# Patient Record
Sex: Male | Born: 1965
Health system: Southern US, Academic
[De-identification: ages and names within clinical notes are randomized; demographics above are authoritative.]

## PROBLEM LIST (undated history)

## (undated) ENCOUNTER — Ambulatory Visit

## (undated) DIAGNOSIS — IMO0002 Reserved for concepts with insufficient information to code with codable children: Secondary | ICD-10-CM

## (undated) DIAGNOSIS — F32A Depression, unspecified: Secondary | ICD-10-CM

## (undated) DIAGNOSIS — M1990 Unspecified osteoarthritis, unspecified site: Secondary | ICD-10-CM

## (undated) DIAGNOSIS — F329 Major depressive disorder, single episode, unspecified: Secondary | ICD-10-CM

## (undated) DIAGNOSIS — N200 Calculus of kidney: Secondary | ICD-10-CM

## (undated) HISTORY — DX: Reserved for concepts with insufficient information to code with codable children: IMO0002

## (undated) HISTORY — DX: Calculus of kidney: N20.0

## (undated) HISTORY — PX: MICRODISCECTOMY LUMBAR: SUR864

## (undated) HISTORY — DX: Unspecified osteoarthritis, unspecified site: M19.90

## (undated) HISTORY — DX: Depression, unspecified: F32.A

## (undated) HISTORY — DX: Major depressive disorder, single episode, unspecified: F32.9

---
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
## 1998-01-15 ENCOUNTER — Emergency Department (HOSPITAL_COMMUNITY): Admission: EM | Admit: 1998-01-15 | Discharge: 1998-01-15 | Payer: Self-pay

## 1998-01-19 ENCOUNTER — Emergency Department (HOSPITAL_COMMUNITY): Admission: EM | Admit: 1998-01-19 | Discharge: 1998-01-19 | Payer: Self-pay | Admitting: Emergency Medicine

## 2000-12-06 ENCOUNTER — Encounter: Admission: RE | Admit: 2000-12-06 | Discharge: 2000-12-06 | Payer: Self-pay | Admitting: Family Medicine

## 2000-12-06 ENCOUNTER — Encounter: Payer: Self-pay | Admitting: Family Medicine

## 2009-07-09 ENCOUNTER — Emergency Department (HOSPITAL_COMMUNITY): Admission: EM | Admit: 2009-07-09 | Discharge: 2009-07-09 | Payer: Self-pay | Admitting: Emergency Medicine

## 2010-01-27 ENCOUNTER — Ambulatory Visit (HOSPITAL_BASED_OUTPATIENT_CLINIC_OR_DEPARTMENT_OTHER): Admission: RE | Admit: 2010-01-27 | Discharge: 2010-01-27 | Payer: Self-pay | Admitting: Otolaryngology

## 2010-01-31 ENCOUNTER — Ambulatory Visit: Payer: Self-pay | Admitting: Pulmonary Disease

## 2010-03-31 ENCOUNTER — Ambulatory Visit (HOSPITAL_COMMUNITY): Admission: RE | Admit: 2010-03-31 | Payer: Self-pay | Source: Home / Self Care | Admitting: Orthopedic Surgery

## 2010-06-19 ENCOUNTER — Other Ambulatory Visit: Payer: Self-pay | Admitting: Orthopedic Surgery

## 2010-06-19 ENCOUNTER — Encounter (HOSPITAL_COMMUNITY): Payer: PRIVATE HEALTH INSURANCE

## 2010-06-19 ENCOUNTER — Other Ambulatory Visit (HOSPITAL_COMMUNITY): Payer: Self-pay | Admitting: Orthopedic Surgery

## 2010-06-19 ENCOUNTER — Ambulatory Visit (HOSPITAL_COMMUNITY)
Admission: RE | Admit: 2010-06-19 | Discharge: 2010-06-19 | Disposition: A | Discharge status: Home / Self Care | Payer: PRIVATE HEALTH INSURANCE | Source: Ambulatory Visit | Attending: Orthopedic Surgery | Admitting: Orthopedic Surgery

## 2010-06-19 DIAGNOSIS — M47817 Spondylosis without myelopathy or radiculopathy, lumbosacral region: Secondary | ICD-10-CM | POA: Insufficient documentation

## 2010-06-19 DIAGNOSIS — M5126 Other intervertebral disc displacement, lumbar region: Secondary | ICD-10-CM | POA: Insufficient documentation

## 2010-06-19 DIAGNOSIS — Z01818 Encounter for other preprocedural examination: Secondary | ICD-10-CM | POA: Insufficient documentation

## 2010-06-19 DIAGNOSIS — Z01812 Encounter for preprocedural laboratory examination: Secondary | ICD-10-CM | POA: Insufficient documentation

## 2010-06-19 LAB — DIFFERENTIAL
Basophils Absolute: 0 10*3/uL (ref 0.0–0.1)
Basophils Relative: 0 % (ref 0–1)
Eosinophils Absolute: 0.2 10*3/uL (ref 0.0–0.7)
Eosinophils Relative: 2 % (ref 0–5)
Lymphocytes Relative: 32 % (ref 12–46)
Lymphs Abs: 3.5 10*3/uL (ref 0.7–4.0)
Monocytes Absolute: 1.2 10*3/uL — ABNORMAL HIGH (ref 0.1–1.0)
Monocytes Relative: 11 % (ref 3–12)
Neutro Abs: 5.8 10*3/uL (ref 1.7–7.7)
Neutrophils Relative %: 54 % (ref 43–77)

## 2010-06-19 LAB — CBC
HCT: 43.8 % (ref 39.0–52.0)
Hemoglobin: 14.5 g/dL (ref 13.0–17.0)
MCH: 30.5 pg (ref 26.0–34.0)
MCHC: 33.1 g/dL (ref 30.0–36.0)
MCV: 92 fL (ref 78.0–100.0)
Platelets: 303 10*3/uL (ref 150–400)
RBC: 4.76 MIL/uL (ref 4.22–5.81)
RDW: 12.7 % (ref 11.5–15.5)
WBC: 10.7 10*3/uL — ABNORMAL HIGH (ref 4.0–10.5)

## 2010-06-19 LAB — URINALYSIS, ROUTINE W REFLEX MICROSCOPIC
Bilirubin Urine: NEGATIVE
Glucose, UA: NEGATIVE mg/dL
Hgb urine dipstick: NEGATIVE
Ketones, ur: NEGATIVE mg/dL
Nitrite: NEGATIVE
Protein, ur: NEGATIVE mg/dL
Specific Gravity, Urine: 1.011 (ref 1.005–1.030)
Urobilinogen, UA: 0.2 mg/dL (ref 0.0–1.0)
pH: 6.5 (ref 5.0–8.0)

## 2010-06-19 LAB — COMPREHENSIVE METABOLIC PANEL
ALT: 18 U/L (ref 0–53)
Albumin: 3.9 g/dL (ref 3.5–5.2)
Alkaline Phosphatase: 63 U/L (ref 39–117)
BUN: 6 mg/dL (ref 6–23)
Chloride: 104 mEq/L (ref 96–112)
Potassium: 3.8 mEq/L (ref 3.5–5.1)
Total Bilirubin: 0.6 mg/dL (ref 0.3–1.2)

## 2010-06-19 LAB — PROTIME-INR
INR: 1.06 (ref 0.00–1.49)
Prothrombin Time: 14 seconds (ref 11.6–15.2)

## 2010-06-19 LAB — APTT: aPTT: 34 seconds (ref 24–37)

## 2010-06-19 LAB — SURGICAL PCR SCREEN: MRSA, PCR: NEGATIVE

## 2010-06-24 ENCOUNTER — Ambulatory Visit (HOSPITAL_COMMUNITY): Payer: PRIVATE HEALTH INSURANCE

## 2010-06-24 ENCOUNTER — Ambulatory Visit (HOSPITAL_COMMUNITY)
Admission: RE | Admit: 2010-06-24 | Discharge: 2010-06-25 | Disposition: A | Discharge status: Home / Self Care | Payer: PRIVATE HEALTH INSURANCE | Source: Ambulatory Visit | Attending: Orthopedic Surgery | Admitting: Orthopedic Surgery

## 2010-06-24 DIAGNOSIS — F17200 Nicotine dependence, unspecified, uncomplicated: Secondary | ICD-10-CM | POA: Insufficient documentation

## 2010-06-24 DIAGNOSIS — Z9889 Other specified postprocedural states: Secondary | ICD-10-CM

## 2010-06-24 DIAGNOSIS — M5126 Other intervertebral disc displacement, lumbar region: Secondary | ICD-10-CM | POA: Insufficient documentation

## 2010-06-24 HISTORY — PX: HEMILAMINOTOMY LUMBAR SPINE: SUR654

## 2010-06-24 LAB — TYPE AND SCREEN: ABO/RH(D): A POS

## 2010-06-25 ENCOUNTER — Institutional Professional Consult (permissible substitution): Payer: Self-pay | Admitting: Internal Medicine

## 2010-06-25 ENCOUNTER — Other Ambulatory Visit: Payer: Self-pay | Admitting: Orthopedic Surgery

## 2010-06-26 ENCOUNTER — Telehealth (INDEPENDENT_AMBULATORY_CARE_PROVIDER_SITE_OTHER): Payer: Self-pay | Admitting: *Deleted

## 2010-06-30 NOTE — Progress Notes (Signed)
Summary: appt change  Phone Note Call from Patient Call back at Home Phone [phone]   Caller: Spouse//deanna Call For: young Summary of Call: Pt has appt on 3/15 (new consult) wants to know if it can be changed to 3/20 pls advise. Initial call taken by: Darletta Moll,  June 26, 2010 11:47 AM  Follow-up for Phone Call        Please advise we can not change to 07-07-10 as this is go-live for our dept. Sorry for the inconvenice.[name] CMA  June 26, 2010 12:29 PM     Additional Follow-up for Phone Call Additional follow up Details #2::    Called and spoke with pt's wife (who originally called) and informed her that is our "go live" date for our new system and we have no way of working them in because of that. Pt wife states he is going to have surgery and he will have stitches in place when he comes to see Dr. Maple Hudson on 3/15 and she was just concerned that it would interfer with the consult if Dr. Maple Hudson required the pt to do anything vigorous. Pt wife states she will keep the 3/15 apt but reschedule when it comes close to the date if he is not able to make it. [name]  June 26, 2010 1:55 PM

## 2010-07-01 NOTE — Op Note (Signed)
NAMEQUIRINO, Erik Shaffer                ACCOUNT NO.:  [id number]  MEDICAL RECORD NO.:  [id number]           PATIENT TYPE:  O  LOCATION:  XRAY                         FACILITY:  [facility]  PHYSICIAN:  Georges Lynch. Chyanne Kohut, M.D.DATE OF BIRTH:  12-05-1965  DATE OF PROCEDURE:  06/24/2010 DATE OF DISCHARGE:  06/19/2010                              OPERATIVE REPORT   SURGEON:  Windy Fast A. Darrelyn Hillock, M.D.  ASSISTANT:  Marlowe Kays, M.D.  PREOPERATIVE DIAGNOSIS:  Large herniated lumbar disk at L5-S1 on the left.  All the symptoms were in the left leg.  POSTOPERATIVE DIAGNOSIS:  Large herniated lumbar disk at L5-S1 on the left.  All the symptoms were in the left leg.  OPERATION:  Hemilaminectomy and microdiskectomy at L5-S1 on the left.  DESCRIPTION OF PROCEDURE:  Under general anesthesia, routine orthopedic prep and draping of the lower back was carried out.  Appropriate time- out was carried out prior to make an incision.  We did mark the left side of his back in the holding area preop.  This time, he had 1 gram of IV Ancef.  Also, after sterile prep and draping of the spinal frame, an x-ray was taken with two needles in place.  Note, the x-ray was taken to localize our incision before any incision was made.  Following that an incision was made over the L5-S1 interspace.  Bleeders were identified and cauterized.  The muscle was stripped from the lamina bilaterally. We did strip it on the right side for retraction purposes.  The McCullough retractor was inserted after another x-ray was taken. Following that, we went down, carried out the hemilaminectomy in usual fashion.  The microscope was used.  At this time, we removed the ligamentum flavum with great care taken not to injure the underlying dura.  The dura was protected with the cottonoids.  At this time, we gently retracted the S1 root and the dura.  We identified the disk and the lateral recess veins were cauterized.  At this time, needles  were placed into the disk space for localization purposes.  We completed a microdiskectomy at L5-S1 on the left.  We went up on the ligamentum flavum medially, proximally, distally and laterally in all directions to make sure there were no other loose fragments noted.  Multiple passes were made into the disk space as well.  We did a nice foraminotomy for the S1 root.  We were able to passed the hockey-stick easily out of the foramen for the S1 root.  Thoroughly irrigated out the area.  We back looked in the space and there were no other loose fragments noted.  I then irrigated the area out and again removed the fluid and loosely applied some thrombin-soaked Gelfoam.  The wound was closed in layered in the usual fashion except I left a small distal deep part of the wound open for drainage purposes.  Subcu was closed with Vicryl, skin with metal staples.  Sterile Neosporin dressing was applied.  The patient left the operative room in satisfactory condition.  ESTIMATED BLOOD LOSS:  75 mL.  ______________________________ Georges Lynch Darrelyn Hillock, M.D.     RAG/MEDQ  D:  06/24/2010  T:  06/24/2010  Job:  [id number]  cc:   Windy Fast A. Darrelyn Hillock, M.D. Fax: [phone]  Electronically Signed by Ranee Gosselin M.D. on 07/01/2010 08:29:29 AM

## 2010-07-02 ENCOUNTER — Institutional Professional Consult (permissible substitution): Payer: Self-pay | Admitting: Internal Medicine

## 2010-08-18 ENCOUNTER — Institutional Professional Consult (permissible substitution): Payer: Self-pay | Admitting: Internal Medicine

## 2010-09-28 ENCOUNTER — Encounter: Payer: Self-pay | Admitting: Internal Medicine

## 2010-10-01 ENCOUNTER — Institutional Professional Consult (permissible substitution): Payer: Self-pay | Admitting: Internal Medicine

## 2010-11-05 ENCOUNTER — Institutional Professional Consult (permissible substitution): Payer: Self-pay | Admitting: Internal Medicine

## 2010-11-19 ENCOUNTER — Ambulatory Visit (INDEPENDENT_AMBULATORY_CARE_PROVIDER_SITE_OTHER): Payer: PRIVATE HEALTH INSURANCE | Admitting: Internal Medicine

## 2010-11-19 ENCOUNTER — Encounter: Payer: Self-pay | Admitting: Internal Medicine

## 2010-11-19 ENCOUNTER — Ambulatory Visit (INDEPENDENT_AMBULATORY_CARE_PROVIDER_SITE_OTHER)
Admission: RE | Admit: 2010-11-19 | Discharge: 2010-11-19 | Disposition: A | Discharge status: Home / Self Care | Payer: PRIVATE HEALTH INSURANCE | Source: Ambulatory Visit | Attending: Internal Medicine | Admitting: Internal Medicine

## 2010-11-19 VITALS — BP 120/72 | HR 80 | Temp 97.9°F | Resp 16 | Ht 71.0 in | Wt 190.0 lb

## 2010-11-19 DIAGNOSIS — M542 Cervicalgia: Secondary | ICD-10-CM

## 2010-11-19 DIAGNOSIS — M5412 Radiculopathy, cervical region: Secondary | ICD-10-CM

## 2010-11-19 DIAGNOSIS — F32A Depression, unspecified: Secondary | ICD-10-CM | POA: Insufficient documentation

## 2010-11-19 DIAGNOSIS — F329 Major depressive disorder, single episode, unspecified: Secondary | ICD-10-CM | POA: Insufficient documentation

## 2010-11-19 MED ORDER — DULOXETINE HCL 30 MG PO CPEP: 30.0000 mg | ORAL_CAPSULE | Freq: Every day | ORAL | Status: DC

## 2010-11-19 MED ORDER — PREGABALIN 50 MG PO CAPS: 50.0000 mg | ORAL_CAPSULE | Freq: Two times a day (BID) | ORAL | Status: DC

## 2010-11-19 NOTE — Patient Instructions (Signed)
Torticollis, Acute (Wry Neck)  You have suddenly (acutely) developed a twisted neck (torticollis). This is usually a self-limited condition.  CAUSES  Acute torticollis may be caused by malposition, trauma or infection. Most commonly, acute torticollis is caused by sleeping in an awkward position. Torticollis may also be caused by the flexion, extension or twisting of the neck muscles beyond their normal position. Sometimes, the exact cause may not be known.  SYMPTOMS  Usually, there is pain and limited movement of the neck. Your neck may twist to one side.  DIAGNOSIS  The diagnosis is often made by physical examination. X-rays, CT scans or MRIs may be done if there is a history of trauma or concern of infection.  TREATMENT  For a common, stiff neck that develops during sleep, treatment is focused on relaxing the contracted neck muscle. Medications (including shots) may be used to treat the problem. Most cases resolve in several days. Torticollis usually responds to conservative physical therapy. If left untreated, the shortened and spastic neck muscle can cause deformities in the face and neck. Rarely, surgery is required.  HOME CARE INSTRUCTIONS   Use over-the-counter and prescription medications as directed by your caregiver.    Do stretching exercises and massage the neck as directed by your caregiver.    Follow up with physical therapy if needed and as directed by your caregiver.   SEEK IMMEDIATE MEDICAL CARE IF:   You develop difficulty breathing or noisy breathing (stridor).    You drool, develop trouble swallowing or have pain with swallowing.    You develop numbness or weakness in the hands or feet.    You have changes in speech or vision.    You have problems with urination or bowel movements.    You have difficulty walking.    You have an oral temperature above 102 F (38.9 C), not controlled by medicine.    You have increased pain.   MAKE SURE YOU:   Understand these instructions.     Will watch your condition.    Will get help right away if you are not doing well or get worse.   Document Released: 04/02/2000 Document Re-Released: 06/30/2009  [employer] Patient Information 2011 [employer].

## 2010-11-20 ENCOUNTER — Encounter: Payer: Self-pay | Admitting: Internal Medicine

## 2010-11-20 NOTE — Progress Notes (Signed)
Subjective:    Patient ID: REAL CONA, male    DOB: 1965/09/30, 45 y.o.   MRN: [id number]  Neck Pain  This is a new problem. The current episode started more than 1 month ago (3 months). The problem occurs constantly. The problem has been gradually worsening. The pain is associated with lifting a heavy object. The pain is present in the midline and right side. The quality of the pain is described as burning and shooting. The pain is at a severity of 5/10. The pain is mild. The symptoms are aggravated by bending. The pain is worse during the day. Stiffness is present all day. Associated symptoms include numbness (right arm) and tingling (right arm). Pertinent negatives include no chest pain, fever, headaches, leg pain, pain with swallowing, paresis, photophobia, syncope, trouble swallowing, visual change, weakness or weight loss. He has tried acetaminophen and NSAIDs for the symptoms. The treatment provided mild relief.      Review of Systems  Constitutional: Positive for fatigue. Negative for fever, chills, weight loss, diaphoresis, activity change, appetite change and unexpected weight change.  HENT: Positive for neck pain and neck stiffness. Negative for ear pain, nosebleeds, congestion, sore throat, facial swelling, rhinorrhea, sneezing, drooling, mouth sores, trouble swallowing, dental problem, voice change, postnasal drip, sinus pressure and tinnitus.   Eyes: Negative for photophobia, redness and visual disturbance.  Respiratory: Negative for apnea, cough, choking, chest tightness, shortness of breath, wheezing and stridor.   Cardiovascular: Negative for chest pain, palpitations, leg swelling and syncope.  Gastrointestinal: Negative for nausea, vomiting, abdominal pain, diarrhea and constipation.  Genitourinary: Negative for dysuria, urgency, frequency, hematuria, flank pain, decreased urine volume, enuresis and difficulty urinating.  Musculoskeletal: Negative for myalgias, back pain, joint  swelling, arthralgias and gait problem.  Skin: Negative for color change, pallor, rash and wound.  Neurological: Positive for tingling (right arm) and numbness (right arm). Negative for dizziness, tremors, seizures, syncope, facial asymmetry, speech difficulty, weakness, light-headedness and headaches.  Hematological: Negative for adenopathy. Does not bruise/bleed easily.  Psychiatric/Behavioral: Positive for behavioral problems (anhedonia), dysphoric mood and decreased concentration. Negative for suicidal ideas, hallucinations, confusion, sleep disturbance, self-injury and agitation. The patient is not nervous/anxious and is not hyperactive.        Objective:   Physical Exam  Vitals reviewed. Constitutional: He is oriented to person, place, and time. He appears well-developed and well-nourished. No distress.  HENT:  Head: Normocephalic and atraumatic.  Right Ear: External ear normal.  Left Ear: External ear normal.  Nose: Nose normal.  Mouth/Throat: Oropharynx is clear and moist. No oropharyngeal exudate.  Eyes: Conjunctivae and EOM are normal. Pupils are equal, round, and reactive to light. Right eye exhibits no discharge. Left eye exhibits no discharge. No scleral icterus.  Neck: Normal range of motion. Neck supple. No JVD present. No tracheal deviation present. No thyromegaly present.  Cardiovascular: Normal rate, regular rhythm, normal heart sounds and intact distal pulses.  Exam reveals no gallop and no friction rub.   No murmur heard. Pulmonary/Chest: Effort normal and breath sounds normal. No stridor. No respiratory distress. He has no wheezes. He has no rales. He exhibits no tenderness.  Abdominal: Soft. Bowel sounds are normal. He exhibits no distension and no mass. There is no tenderness. There is no rebound and no guarding.  Musculoskeletal: Normal range of motion. He exhibits no edema and no tenderness.       Cervical back: Normal. He exhibits normal range of motion, no  tenderness, no bony tenderness, no swelling,  no edema, no deformity, no laceration, no pain, no spasm and normal pulse.  Lymphadenopathy:    He has no cervical adenopathy.  Neurological: He is alert and oriented to person, place, and time. He has normal strength. He displays abnormal reflex. He displays no atrophy and no tremor. No cranial nerve deficit or sensory deficit. He exhibits normal muscle tone. He displays a negative Romberg sign. He displays no seizure activity. Coordination and gait normal.  Reflex Scores:      Tricep reflexes are 0 on the right side and 1+ on the left side.      Bicep reflexes are 0 on the right side and 1+ on the left side.      Brachioradialis reflexes are 0 on the right side and 1+ on the left side.      Patellar reflexes are 1+ on the right side and 1+ on the left side.      Achilles reflexes are 1+ on the right side and 1+ on the left side. Skin: Skin is warm and dry. No rash noted. He is not diaphoretic. No erythema. No pallor.  Psychiatric: He has a normal mood and affect. His behavior is normal. Judgment and thought content normal.          Assessment & Plan:

## 2010-11-21 ENCOUNTER — Encounter: Payer: Self-pay | Admitting: Internal Medicine

## 2010-11-21 NOTE — Assessment & Plan Note (Signed)
i will check plain films and start lyrica since I think this has a neuro component

## 2010-11-21 NOTE — Assessment & Plan Note (Signed)
Start cymbalta for the depression and pain management

## 2010-11-21 NOTE — Assessment & Plan Note (Signed)
He has abnormal reflexes in his right arm so I think he has spinal stenosis, HNP, mass, or nerve impingement, I will get plain films today and schedule him for an MRI

## 2010-11-30 ENCOUNTER — Ambulatory Visit (HOSPITAL_COMMUNITY)
Admission: RE | Admit: 2010-11-30 | Discharge: 2010-11-30 | Disposition: A | Discharge status: Home / Self Care | Payer: PRIVATE HEALTH INSURANCE | Source: Ambulatory Visit | Attending: Internal Medicine | Admitting: Internal Medicine

## 2010-11-30 DIAGNOSIS — M5412 Radiculopathy, cervical region: Secondary | ICD-10-CM

## 2010-11-30 DIAGNOSIS — M5030 Other cervical disc degeneration, unspecified cervical region: Secondary | ICD-10-CM | POA: Insufficient documentation

## 2010-11-30 DIAGNOSIS — M5020 Other cervical disc displacement, unspecified cervical region: Secondary | ICD-10-CM | POA: Insufficient documentation

## 2010-11-30 DIAGNOSIS — M542 Cervicalgia: Secondary | ICD-10-CM | POA: Insufficient documentation

## 2010-11-30 DIAGNOSIS — M79609 Pain in unspecified limb: Secondary | ICD-10-CM | POA: Insufficient documentation

## 2010-11-30 DIAGNOSIS — R209 Unspecified disturbances of skin sensation: Secondary | ICD-10-CM | POA: Insufficient documentation

## 2010-12-02 ENCOUNTER — Other Ambulatory Visit: Payer: Self-pay | Admitting: Internal Medicine

## 2010-12-02 DIAGNOSIS — M5412 Radiculopathy, cervical region: Secondary | ICD-10-CM

## 2010-12-03 ENCOUNTER — Telehealth: Payer: Self-pay

## 2010-12-03 DIAGNOSIS — M5412 Radiculopathy, cervical region: Secondary | ICD-10-CM

## 2010-12-03 DIAGNOSIS — M542 Cervicalgia: Secondary | ICD-10-CM

## 2010-12-03 MED ORDER — HYDROCODONE-IBUPROFEN 10-200 MG PO TABS: 1.0000 | ORAL_TABLET | Freq: Three times a day (TID) | ORAL | Status: DC | PRN

## 2010-12-03 NOTE — Telephone Encounter (Signed)
Patient wife called to check status of pain mang referral stating that he was unable to sleep last night due to pain. I advised that we have faxed info and waiting for them to  Review records and schedule appt. They would like to know if MD could temporally prescribe something to help out until he is seen. He has tried Norco in the past it it helped

## 2010-12-03 NOTE — Telephone Encounter (Signed)
rx called in and wife notified.

## 2010-12-03 NOTE — Telephone Encounter (Signed)
Please ask him to stop the indomethacin and please call in the reprexain for him

## 2010-12-14 ENCOUNTER — Ambulatory Visit (INDEPENDENT_AMBULATORY_CARE_PROVIDER_SITE_OTHER): Payer: PRIVATE HEALTH INSURANCE | Admitting: Internal Medicine

## 2010-12-14 ENCOUNTER — Encounter: Payer: Self-pay | Admitting: Internal Medicine

## 2010-12-14 DIAGNOSIS — M5412 Radiculopathy, cervical region: Secondary | ICD-10-CM

## 2010-12-14 DIAGNOSIS — F329 Major depressive disorder, single episode, unspecified: Secondary | ICD-10-CM

## 2010-12-14 DIAGNOSIS — M542 Cervicalgia: Secondary | ICD-10-CM

## 2010-12-14 MED ORDER — HYDROCODONE-IBUPROFEN 10-200 MG PO TABS: 1.0000 | ORAL_TABLET | Freq: Three times a day (TID) | ORAL | Status: DC | PRN

## 2010-12-14 MED ORDER — DULOXETINE HCL 60 MG PO CPEP: 60.0000 mg | ORAL_CAPSULE | Freq: Every day | ORAL | Status: DC

## 2010-12-14 NOTE — Patient Instructions (Signed)

## 2010-12-16 ENCOUNTER — Encounter: Payer: Self-pay | Admitting: Internal Medicine

## 2010-12-16 NOTE — Assessment & Plan Note (Signed)
He will see a pain specialist next week  And will see if an EPSI will help his pain

## 2010-12-16 NOTE — Progress Notes (Signed)
Subjective:    Patient ID: Erik Shaffer, male    DOB: Mar 23, 1966, 45 y.o.   MRN: [id number]  Neck Pain  This is a recurrent problem. The current episode started more than 1 month ago. The problem occurs intermittently. The problem has been unchanged. The pain is present in the midline. The quality of the pain is described as aching and burning. The pain is at a severity of 4/10. The pain is mild. The symptoms are aggravated by position. The pain is worse during the day. Stiffness is present all day. Associated symptoms include numbness (right arm to the elbow). Pertinent negatives include no chest pain, fever, headaches, leg pain, pain with swallowing, paresis, photophobia, syncope, tingling, trouble swallowing, visual change, weakness or weight loss. He has tried NSAIDs and oral narcotics for the symptoms. The treatment provided moderate relief.      Review of Systems  Constitutional: Negative for fever, chills, weight loss, diaphoresis, activity change, appetite change, fatigue and unexpected weight change.  HENT: Positive for neck pain and neck stiffness. Negative for hearing loss, ear pain, nosebleeds, sore throat, facial swelling, drooling, trouble swallowing, voice change and ear discharge.   Eyes: Negative for photophobia, pain, discharge, redness, itching and visual disturbance.  Respiratory: Negative for apnea, cough, choking, chest tightness, shortness of breath, wheezing and stridor.   Cardiovascular: Negative for chest pain, palpitations, leg swelling and syncope.  Gastrointestinal: Negative for nausea, vomiting, abdominal pain, diarrhea, constipation, blood in stool and abdominal distention.  Genitourinary: Negative for dysuria, urgency, frequency, hematuria, decreased urine volume, enuresis and difficulty urinating.  Musculoskeletal: Negative for myalgias, back pain, joint swelling, arthralgias and gait problem.  Skin: Negative for color change, pallor, rash and wound.    Neurological: Positive for numbness (right arm to the elbow). Negative for dizziness, tingling, tremors, seizures, syncope, facial asymmetry, speech difficulty, weakness, light-headedness and headaches.  Hematological: Negative for adenopathy. Does not bruise/bleed easily.  Psychiatric/Behavioral: Negative for suicidal ideas, hallucinations, behavioral problems, confusion, sleep disturbance, self-injury, dysphoric mood, decreased concentration and agitation. The patient is not nervous/anxious and is not hyperactive.        Objective:   Physical Exam  Vitals reviewed. Constitutional: He is oriented to person, place, and time. He appears well-developed and well-nourished. No distress.  HENT:  Head: Normocephalic and atraumatic.  Mouth/Throat: No oropharyngeal exudate.  Eyes: Conjunctivae are normal. Right eye exhibits no discharge. Left eye exhibits no discharge. No scleral icterus.  Neck: Normal range of motion. Neck supple. No JVD present. No tracheal deviation present. No thyromegaly present.  Cardiovascular: Normal rate, regular rhythm, normal heart sounds and intact distal pulses.  Exam reveals no gallop and no friction rub.   No murmur heard. Pulmonary/Chest: Effort normal and breath sounds normal. No stridor. No respiratory distress. He has no wheezes. He has no rales. He exhibits no tenderness.  Abdominal: Soft. Bowel sounds are normal. He exhibits no distension and no mass. There is no tenderness. There is no rebound and no guarding.  Musculoskeletal: Normal range of motion. He exhibits no edema and no tenderness.       Cervical back: Normal. He exhibits normal range of motion, no tenderness, no bony tenderness, no swelling, no edema, no deformity, no laceration, no pain, no spasm and normal pulse.  Lymphadenopathy:    He has no cervical adenopathy.  Neurological: He is alert and oriented to person, place, and time. He has normal reflexes. He displays normal reflexes. No cranial nerve  deficit. He exhibits normal muscle tone.  Coordination normal.  Skin: Skin is warm and dry. No rash noted. He is not diaphoretic. No erythema. No pallor.  Psychiatric: He has a normal mood and affect. His behavior is normal. Judgment and thought content normal.          Assessment & Plan:

## 2010-12-16 NOTE — Assessment & Plan Note (Addendum)
He is doing well on the current meds so I will continue them, will continue cymbalta as well as this helps with pain

## 2010-12-16 NOTE — Assessment & Plan Note (Signed)
He is doing much better on cymbalta

## 2010-12-22 ENCOUNTER — Encounter: Payer: PRIVATE HEALTH INSURANCE | Attending: Physical Medicine & Rehabilitation

## 2010-12-22 ENCOUNTER — Ambulatory Visit (HOSPITAL_BASED_OUTPATIENT_CLINIC_OR_DEPARTMENT_OTHER): Payer: PRIVATE HEALTH INSURANCE | Admitting: Physical Medicine & Rehabilitation

## 2010-12-22 DIAGNOSIS — M545 Low back pain, unspecified: Secondary | ICD-10-CM | POA: Insufficient documentation

## 2010-12-22 DIAGNOSIS — M5030 Other cervical disc degeneration, unspecified cervical region: Secondary | ICD-10-CM | POA: Insufficient documentation

## 2010-12-22 DIAGNOSIS — M5020 Other cervical disc displacement, unspecified cervical region: Secondary | ICD-10-CM | POA: Insufficient documentation

## 2010-12-22 DIAGNOSIS — M25519 Pain in unspecified shoulder: Secondary | ICD-10-CM | POA: Insufficient documentation

## 2010-12-22 DIAGNOSIS — R209 Unspecified disturbances of skin sensation: Secondary | ICD-10-CM | POA: Insufficient documentation

## 2010-12-22 DIAGNOSIS — M5412 Radiculopathy, cervical region: Secondary | ICD-10-CM | POA: Insufficient documentation

## 2010-12-22 DIAGNOSIS — M4802 Spinal stenosis, cervical region: Secondary | ICD-10-CM | POA: Insufficient documentation

## 2010-12-22 DIAGNOSIS — M961 Postlaminectomy syndrome, not elsewhere classified: Secondary | ICD-10-CM

## 2010-12-28 NOTE — Consult Note (Addendum)
REASON FOR VISIT:  Right shoulder pain.  HISTORY OF PRESENT ILLNESS:  A 45 year old male with a 51-month history of neck pain radiating into the right shoulder.  He also has numbness and tingling that go into his right thumb.  He has no history of trauma. He does work in fairly heavy manual labor.  He has seen his primary physician and was sent for a cervical MRI.  This showed a broad-based disk protrusion and uncovertebral spurring resulting in foraminal encroachment bilaterally and moderate spinal stenosis.  He denies any symptoms on the left side.  He has mild degenerative disk at C4-5 and C6- 7.  I reviewed the MRIs and certainly the findings at 4-5 and 6-7 are minimal.  He does have a significant disk osteophyte complex impinging upon the cord without evidence of intervening CSF.  Pain scores are 4/10 currently up to 8 at times.  Pain interferes with activity at a 4/10 level.  He has had no bowel or bladder dysfunction.  No gait disorder.  He has not been dropping objects on the right side.Other recent medical history had a radiculopathy and underwent L5-S1 hemilaminectomy microdiskectomy on left side for left lumbar radic at that level.  He has had very good relief after that surgery.  No lower extremity pain anymore.  His low back pain is minimal.  His other past medical history significant for depression, was started on Cymbalta by his primary physician.  He has tried multiple medications including oxycodone, hydrocodone, and now is taking Vicoprofen.  He has taken a Fioricet from his wife in combination with his Vicoprofen, I did caution him that this is not advisable and has spoken to his wife about that and she concurs.  She is a Engineer, civil (consulting).  This combination does seem to help him for some reason.  He has gotten drowsy from Flexeril as well as well as from Lyrica.  He has tingling and depression on his review of systems, otherwise negative.  FAMILY HISTORY:  Diabetes, lung  disease, alcohol abuse, disability.  SOCIAL HISTORY:  Married, nonsmoker, nondrinker.  PHYSICAL EXAMINATION:  GENERAL:  On examination, a well-developed, well- nourished male in no acute distress.  Mood and affect are appropriate. His back has a healed midline incision in the lower lumbosacral area. VITAL SIGNS:  Blood pressure is 124/74, pulse 68, respirations 18, O2 sat 98% on room air. MUSCULOSKELETAL:  His neck range of motion reduced about 50% forward flexion/extension, lateral rotation and bending, extension and right- sided lateral bending seem to bother him the most.  Spurling sign is negative.  He has guarding at the neck.  He has no tenderness to palpation along the cervical paraspinals.  Right shoulder impingement testing is negative.  Sensation is intact to pinprick in C6, 7, 8 dermatomes with decrease in the right C5.  Deep tendon reflexes reduced, right biceps, triceps compared to left side.  Motor strength 5/5, deltoid, biceps, triceps, and grip.  Gait is normal.  IMPRESSION:  Right C6 radiculopathy resulting from herniated nucleus pulposus at C5-6 resulting in moderate foraminal stenosis bilaterally, but only symptomatic on the right side.  As I discussed with the patient, I think his clinical findings as well as radiologic findings and symptoms all match up quite nicely so that I feel he is a good surgical candidate and this would likely give him the best result rather than going through some additional conservative care. We will make referral to Dr. Marikay Alar who operated on the patient's sister.  In addition, recommended to not use his wife's Fioricet in addition to the Vicoprofen.  He could also stop his Lyrica since it was not very helpful.  I may consider some gabapentin and we will defer to Dr. Marikay Alar or Sanda Linger on this.  I will see the patient back on a p.r.n. basis.  I would advise postoperatively after his assuming that he undergoes neck surgery  that he undergoes a postop spine rehab program.  Discussed with patient and wife agree with plan.  I will see him back on a p.r.n. basis.     Erick Colace, M.D.    AEK/MedQ D:12/22/2010 13:24:37  T:12/22/2010 21:22:27  Job #:  [id number]  cc:   Sanda Linger, MD [address] [place] [address]  Marikay Alar  Electronically Signed by Claudette Laws M.D. on 12/28/2010 01:02:18 PM

## 2011-01-06 ENCOUNTER — Telehealth: Payer: Self-pay

## 2011-01-06 DIAGNOSIS — F329 Major depressive disorder, single episode, unspecified: Secondary | ICD-10-CM

## 2011-01-06 DIAGNOSIS — M542 Cervicalgia: Secondary | ICD-10-CM

## 2011-01-06 NOTE — Telephone Encounter (Signed)
yes

## 2011-01-06 NOTE — Telephone Encounter (Signed)
Patient wife called stating that pt does better when taking cymbalta 30mg  BID vs cymbalta 60 QD. Please advise if ok to send a new rx for 30mg  BID to pharmacy. Thanks

## 2011-01-07 MED ORDER — DULOXETINE HCL 30 MG PO CPEP: 30.0000 mg | ORAL_CAPSULE | Freq: Two times a day (BID) | ORAL | Status: DC

## 2011-01-07 NOTE — Telephone Encounter (Signed)
Spouse advised of Rx change and MRI report faxed to [phone] per spouse's request.

## 2011-02-18 HISTORY — PX: SPINE SURGERY: SHX786

## 2011-02-26 ENCOUNTER — Telehealth: Payer: Self-pay

## 2011-02-26 NOTE — Telephone Encounter (Signed)
Patient wife called

## 2011-03-02 NOTE — Telephone Encounter (Signed)
Patient wife called wanting to let MD know that he recently had neck surgery and doing better as far as pain. She states that patient has tried to stop smoking with chantix. She would like to know if MD would change chantix to wellbutrin to help with smoking and depression, pt currently takes

## 2011-03-03 ENCOUNTER — Other Ambulatory Visit: Payer: Self-pay | Admitting: Internal Medicine

## 2011-03-03 DIAGNOSIS — F329 Major depressive disorder, single episode, unspecified: Secondary | ICD-10-CM

## 2011-03-03 MED ORDER — BUPROPION HCL ER (XL) 150 MG PO TB24: 150.0000 mg | ORAL_TABLET | ORAL | Status: DC

## 2011-03-10 ENCOUNTER — Encounter: Payer: Self-pay | Admitting: Internal Medicine

## 2011-03-10 ENCOUNTER — Ambulatory Visit: Payer: PRIVATE HEALTH INSURANCE | Admitting: Internal Medicine

## 2011-03-10 ENCOUNTER — Ambulatory Visit (INDEPENDENT_AMBULATORY_CARE_PROVIDER_SITE_OTHER): Payer: PRIVATE HEALTH INSURANCE | Admitting: Internal Medicine

## 2011-03-10 VITALS — BP 126/72 | HR 73 | Temp 98.1°F | Resp 16 | Ht 72.0 in | Wt 193.0 lb

## 2011-03-10 DIAGNOSIS — R5383 Other fatigue: Secondary | ICD-10-CM | POA: Insufficient documentation

## 2011-03-10 DIAGNOSIS — F329 Major depressive disorder, single episode, unspecified: Secondary | ICD-10-CM

## 2011-03-10 DIAGNOSIS — R5381 Other malaise: Secondary | ICD-10-CM

## 2011-03-10 DIAGNOSIS — Z720 Tobacco use: Secondary | ICD-10-CM | POA: Insufficient documentation

## 2011-03-10 DIAGNOSIS — F17200 Nicotine dependence, unspecified, uncomplicated: Secondary | ICD-10-CM

## 2011-03-10 MED ORDER — DULOXETINE HCL 30 MG PO CPEP: 30.0000 mg | ORAL_CAPSULE | Freq: Two times a day (BID) | ORAL | Status: DC

## 2011-03-10 NOTE — Patient Instructions (Signed)

## 2011-03-10 NOTE — Assessment & Plan Note (Signed)
Continue wellbutrin and try nicotine patches and e-cigs

## 2011-03-10 NOTE — Assessment & Plan Note (Signed)
I will check his TSH and testosterone levels today

## 2011-03-10 NOTE — Progress Notes (Signed)
Subjective:    Patient ID: Erik Shaffer, male    DOB: 10/14/1965, 45 y.o.   MRN: [id number]  HPI He returns for f/up and tells me that over the last few weeks he has developed severe anhedonia, crying spells, feeling "emotional", and fatigued. It sounds like he was doing a little better when he was taking Cymbalta but he called one week ago and he asked to switch to wellbutrin to see if that would help him to quit smoking, and fortunately he is smoking less but is still smoking some. He has also developed decreased libido and ED.  Review of Systems  Constitutional: Positive for fatigue. Negative for fever, chills, diaphoresis, activity change, appetite change and unexpected weight change.  HENT: Negative.  Negative for sore throat, trouble swallowing and voice change.   Eyes: Negative.   Respiratory: Negative for apnea, cough, choking, chest tightness, shortness of breath, wheezing and stridor.   Cardiovascular: Negative for chest pain, palpitations and leg swelling.  Gastrointestinal: Negative for nausea, vomiting, abdominal pain, diarrhea, constipation, blood in stool, abdominal distention, anal bleeding and rectal pain.  Genitourinary: Negative.   Musculoskeletal: Negative.   Skin: Negative for color change, pallor, rash and wound.  Neurological: Negative.   Psychiatric/Behavioral: Positive for dysphoric mood. Negative for suicidal ideas, hallucinations, behavioral problems, confusion, sleep disturbance, self-injury, decreased concentration and agitation. The patient is not nervous/anxious and is not hyperactive.        Objective:   Physical Exam  Vitals reviewed. Constitutional: He is oriented to person, place, and time. He appears well-developed and well-nourished. No distress.  HENT:  Head: Normocephalic and atraumatic.  Mouth/Throat: Oropharynx is clear and moist. No oropharyngeal exudate.  Eyes: Conjunctivae are normal. Right eye exhibits no discharge. Left eye exhibits no  discharge. No scleral icterus.  Neck: Normal range of motion. Neck supple. No JVD present. No tracheal deviation present. No thyromegaly present.  Cardiovascular: Normal rate, regular rhythm, normal heart sounds and intact distal pulses.  Exam reveals no gallop and no friction rub.   No murmur heard. Pulmonary/Chest: Effort normal and breath sounds normal. No stridor. No respiratory distress. He has no wheezes. He has no rales. He exhibits no tenderness.  Abdominal: Soft. Bowel sounds are normal. He exhibits no distension and no mass. There is no tenderness. There is no rebound and no guarding.  Musculoskeletal: Normal range of motion. He exhibits no edema and no tenderness.  Lymphadenopathy:    He has no cervical adenopathy.  Neurological: He is oriented to person, place, and time.  Skin: Skin is warm and dry. No rash noted. He is not diaphoretic. No erythema. No pallor.  Psychiatric: His behavior is normal. Judgment and thought content normal. His mood appears not anxious. His affect is not angry, not blunt, not labile and not inappropriate. His speech is not rapid and/or pressured, not delayed, not tangential and not slurred. He is not agitated, not aggressive, is not hyperactive, not slowed, not withdrawn, not actively hallucinating and not combative. Thought content is not paranoid and not delusional. Cognition and memory are normal. Cognition and memory are not impaired. He does not express impulsivity or inappropriate judgment. He exhibits a depressed mood. He expresses no homicidal and no suicidal ideation. He expresses no suicidal plans and no homicidal plans. He is communicative. He exhibits normal recent memory and normal remote memory.       He is intermittently tearful today He is attentive.          Assessment &  Plan:

## 2011-03-10 NOTE — Assessment & Plan Note (Signed)
I think he needs to restart cymbalta and to stay on wellutrin

## 2011-03-17 ENCOUNTER — Other Ambulatory Visit (INDEPENDENT_AMBULATORY_CARE_PROVIDER_SITE_OTHER): Payer: PRIVATE HEALTH INSURANCE

## 2011-03-17 DIAGNOSIS — R5383 Other fatigue: Secondary | ICD-10-CM

## 2011-03-17 DIAGNOSIS — F329 Major depressive disorder, single episode, unspecified: Secondary | ICD-10-CM

## 2011-03-17 LAB — CBC WITH DIFFERENTIAL/PLATELET
Basophils Relative: 0.4 % (ref 0.0–3.0)
Eosinophils Relative: 2.7 % (ref 0.0–5.0)
HCT: 43.5 % (ref 39.0–52.0)
Lymphs Abs: 2.7 10*3/uL (ref 0.7–4.0)
MCHC: 34.1 g/dL (ref 30.0–36.0)
MCV: 91.3 fl (ref 78.0–100.0)
Monocytes Absolute: 1.2 10*3/uL — ABNORMAL HIGH (ref 0.1–1.0)
Platelets: 404 10*3/uL — ABNORMAL HIGH (ref 150.0–400.0)
WBC: 10.9 10*3/uL — ABNORMAL HIGH (ref 4.5–10.5)

## 2011-03-22 ENCOUNTER — Other Ambulatory Visit: Payer: Self-pay | Admitting: Internal Medicine

## 2011-03-22 ENCOUNTER — Encounter: Payer: Self-pay | Admitting: Internal Medicine

## 2011-03-22 ENCOUNTER — Other Ambulatory Visit: Payer: Self-pay | Admitting: Neurological Surgery

## 2011-03-22 ENCOUNTER — Ambulatory Visit (INDEPENDENT_AMBULATORY_CARE_PROVIDER_SITE_OTHER): Payer: PRIVATE HEALTH INSURANCE | Admitting: Internal Medicine

## 2011-03-22 ENCOUNTER — Ambulatory Visit
Admission: RE | Admit: 2011-03-22 | Discharge: 2011-03-22 | Disposition: A | Discharge status: Home / Self Care | Payer: PRIVATE HEALTH INSURANCE | Source: Ambulatory Visit | Attending: Neurological Surgery | Admitting: Neurological Surgery

## 2011-03-22 ENCOUNTER — Other Ambulatory Visit (INDEPENDENT_AMBULATORY_CARE_PROVIDER_SITE_OTHER): Payer: PRIVATE HEALTH INSURANCE

## 2011-03-22 VITALS — BP 110/62 | HR 70 | Temp 98.4°F | Resp 16 | Wt 192.0 lb

## 2011-03-22 DIAGNOSIS — R5383 Other fatigue: Secondary | ICD-10-CM

## 2011-03-22 DIAGNOSIS — Z0000 Encounter for general adult medical examination without abnormal findings: Secondary | ICD-10-CM | POA: Insufficient documentation

## 2011-03-22 DIAGNOSIS — D72829 Elevated white blood cell count, unspecified: Secondary | ICD-10-CM

## 2011-03-22 DIAGNOSIS — M542 Cervicalgia: Secondary | ICD-10-CM

## 2011-03-22 DIAGNOSIS — R5381 Other malaise: Secondary | ICD-10-CM

## 2011-03-22 DIAGNOSIS — F329 Major depressive disorder, single episode, unspecified: Secondary | ICD-10-CM

## 2011-03-22 LAB — COMPREHENSIVE METABOLIC PANEL
CO2: 27 mEq/L (ref 19–32)
Creatinine, Ser: 0.9 mg/dL (ref 0.4–1.5)
GFR: 96.84 mL/min (ref >60.00)
Glucose, Bld: 72 mg/dL (ref 70–99)
Sodium: 138 mEq/L (ref 135–145)
Total Bilirubin: 0.3 mg/dL (ref 0.3–1.2)
Total Protein: 7.3 g/dL (ref 6.0–8.3)

## 2011-03-22 LAB — LIPID PANEL: Total CHOL/HDL Ratio: 6

## 2011-03-22 LAB — CBC WITH DIFFERENTIAL/PLATELET
Basophils Relative: 0.8 % (ref 0.0–3.0)
Eosinophils Relative: 2.5 % (ref 0.0–5.0)
HCT: 41.9 % (ref 39.0–52.0)
Hemoglobin: 14.3 g/dL (ref 13.0–17.0)
Lymphs Abs: 3.1 10*3/uL (ref 0.7–4.0)
Monocytes Relative: 8.6 % (ref 3.0–12.0)
Neutro Abs: 5.8 10*3/uL (ref 1.4–7.7)
RBC: 4.57 Mil/uL (ref 4.22–5.81)
WBC: 10.1 10*3/uL (ref 4.5–10.5)

## 2011-03-22 LAB — URINALYSIS, ROUTINE W REFLEX MICROSCOPIC
Bilirubin Urine: NEGATIVE
Leukocytes, UA: NEGATIVE
Nitrite: NEGATIVE
pH: 6.5 (ref 5.0–8.0)

## 2011-03-22 NOTE — Assessment & Plan Note (Signed)
Exam done, labs ordered, pt ed material was given 

## 2011-03-22 NOTE — Progress Notes (Signed)
Subjective:    Patient ID: Erik Shaffer, male    DOB: 1965-08-23, 45 y.o.   MRN: [id number]  HPI He returns for a complete physical and to go over his recent labs that showed an elevated WBC count and platelet count. The testosterone level was not done. He feels much better on cymbalta but has persistent fatigue.   Review of Systems  Constitutional: Positive for fatigue. Negative for fever, chills, diaphoresis, activity change, appetite change and unexpected weight change.  HENT: Negative.   Eyes: Negative.   Respiratory: Negative for cough, shortness of breath, wheezing and stridor.   Cardiovascular: Negative for chest pain, palpitations and leg swelling.  Gastrointestinal: Negative for nausea, vomiting, abdominal pain, diarrhea, constipation and abdominal distention.  Genitourinary: Negative for dysuria, urgency, frequency, hematuria, flank pain, decreased urine volume, enuresis and difficulty urinating.  Musculoskeletal: Negative for myalgias, back pain, joint swelling, arthralgias and gait problem.  Skin: Negative for color change, pallor, rash and wound.  Neurological: Negative for tremors, seizures, syncope, facial asymmetry, speech difficulty, weakness, light-headedness, numbness and headaches.  Hematological: Negative for adenopathy. Does not bruise/bleed easily.  Psychiatric/Behavioral: Positive for dysphoric mood. Negative for suicidal ideas, hallucinations, behavioral problems, confusion, sleep disturbance, self-injury, decreased concentration and agitation. The patient is not nervous/anxious and is not hyperactive.        Objective:   Physical Exam  Vitals reviewed. Constitutional: He is oriented to person, place, and time. He appears well-developed and well-nourished. No distress.  HENT:  Head: Normocephalic and atraumatic.  Mouth/Throat: Oropharynx is clear and moist.  Eyes: Conjunctivae are normal. Right eye exhibits no discharge. Left eye exhibits no discharge. No  scleral icterus.  Neck: Normal range of motion. Neck supple. No JVD present. No tracheal deviation present. No thyromegaly present.  Cardiovascular: Normal rate, regular rhythm, normal heart sounds and intact distal pulses.  Exam reveals no gallop and no friction rub.   No murmur heard. Pulmonary/Chest: Effort normal and breath sounds normal. No stridor. No respiratory distress. He has no wheezes. He has no rales. He exhibits no tenderness.  Abdominal: Soft. Bowel sounds are normal. He exhibits no distension and no mass. There is no tenderness. There is no rebound and no guarding. Hernia confirmed negative in the right inguinal area and confirmed negative in the left inguinal area.  Genitourinary: Testes normal and penis normal. Right testis shows no mass, no swelling and no tenderness. Right testis is descended. Left testis shows no mass, no swelling and no tenderness. Left testis is descended. Circumcised. No penile tenderness. No discharge found.  Musculoskeletal: Normal range of motion. He exhibits no edema and no tenderness.  Lymphadenopathy:    He has no cervical adenopathy.       Right: No inguinal adenopathy present.       Left: No inguinal adenopathy present.  Neurological: He is oriented to person, place, and time.  Skin: Skin is warm and dry. No rash noted. He is not diaphoretic. No erythema. No pallor.  Psychiatric: He has a normal mood and affect. His behavior is normal. Judgment and thought content normal.      Lab Results  Component Value Date   WBC 10.9* 03/17/2011   HGB 14.8 03/17/2011   HCT 43.5 03/17/2011   PLT 404.0* 03/17/2011   GLUCOSE 73 06/19/2010   ALT 18 06/19/2010   AST 19 06/19/2010   NA 139 06/19/2010   K 3.8 06/19/2010   CL 104 06/19/2010   CREATININE 0.75 06/19/2010   BUN 6 06/19/2010  CO2 28 06/19/2010   TSH 0.92 03/17/2011   INR 1.06 06/19/2010      Assessment & Plan:

## 2011-03-22 NOTE — Patient Instructions (Signed)
Health Maintenance, Males A healthy lifestyle and preventative care can promote health and wellness.  Maintain regular health, dental, and eye exams.   Eat a healthy diet. Foods like vegetables, fruits, whole grains, low-fat dairy products, and lean protein foods contain the nutrients you need without too many calories. Decrease your intake of foods high in solid fats, added sugars, and salt. Get information about a proper diet from your caregiver, if necessary.   Regular physical exercise is one of the most important things you can do for your health. Most adults should get at least 150 minutes of moderate-intensity exercise (any activity that increases your heart rate and causes you to sweat) each week. In addition, most adults need muscle-strengthening exercises on 2 or more days a week.    Maintain a healthy weight. The body mass index (BMI) is a screening tool to identify possible weight problems. It provides an estimate of body fat based on height and weight. Your caregiver can help determine your BMI, and can help you achieve or maintain a healthy weight. For adults 20 years and older:   A BMI below 18.5 is considered underweight.   A BMI of 18.5 to 24.9 is normal.   A BMI of 25 to 29.9 is considered overweight.   A BMI of 30 and above is considered obese.   Maintain normal blood lipids and cholesterol by exercising and minimizing your intake of saturated fat. Eat a balanced diet with plenty of fruits and vegetables. Blood tests for lipids and cholesterol should begin at age 20 and be repeated every 5 years. If your lipid or cholesterol levels are high, you are over 50, or you are a high risk for heart disease, you may need your cholesterol levels checked more frequently.Ongoing high lipid and cholesterol levels should be treated with medicines, if diet and exercise are not effective.   If you smoke, find out from your caregiver how to quit. If you do not use tobacco, do not start.    If you choose to drink alcohol, do not exceed 2 drinks per day. One drink is considered to be 12 ounces (355 mL) of beer, 5 ounces (148 mL) of wine, or 1.5 ounces (44 mL) of liquor.   Avoid use of street drugs. Do not share needles with anyone. Ask for help if you need support or instructions about stopping the use of drugs.   High blood pressure causes heart disease and increases the risk of stroke. Blood pressure should be checked at least every 1 to 2 years. Ongoing high blood pressure should be treated with medicines if weight loss and exercise are not effective.   If you are 45 to 45 years old, ask your caregiver if you should take aspirin to prevent heart disease.   Diabetes screening involves taking a blood sample to check your fasting blood sugar level. This should be done once every 3 years, after age 45, if you are within normal weight and without risk factors for diabetes. Testing should be considered at a younger age or be carried out more frequently if you are overweight and have at least 1 risk factor for diabetes.   Colorectal cancer can be detected and often prevented. Most routine colorectal cancer screening begins at the age of 50 and continues through age 75. However, your caregiver may recommend screening at an earlier age if you have risk factors for colon cancer. On a yearly basis, your caregiver may provide home test kits to check for hidden   blood in the stool. Use of a small camera at the end of a tube, to directly examine the colon (sigmoidoscopy or colonoscopy), can detect the earliest forms of colorectal cancer. Talk to your caregiver about this at age 50, when routine screening begins. Direct examination of the colon should be repeated every 5 to 10 years through age 75, unless early forms of pre-cancerous polyps or small growths are found.   Healthy men should no longer receive prostate-specific antigen (PSA) blood tests as part of routine cancer screening. Consult with  your caregiver about prostate cancer screening.   Practice safe sex. Use condoms and avoid high-risk sexual practices to reduce the spread of sexually transmitted infections (STIs).   Use sunscreen with a sun protection factor (SPF) of 30 or greater. Apply sunscreen liberally and repeatedly throughout the day. You should seek shade when your shadow is shorter than you. Protect yourself by wearing long sleeves, pants, a wide-brimmed hat, and sunglasses year round, whenever you are outdoors.   Notify your caregiver of new moles or changes in moles, especially if there is a change in shape or color. Also notify your caregiver if a mole is larger than the size of a pencil eraser.   A one-time screening for abdominal aortic aneurysm (AAA) and surgical repair of large AAAs by sound wave imaging (ultrasonography) is recommended for ages 65 to 75 years who are current or former smokers.   Stay current with your immunizations.  Document Released: 10/02/2007 Document Revised: 12/16/2010 Document Reviewed: 08/31/2010 [employer] Patient Information 2012 [employer]. 

## 2011-03-22 NOTE — Assessment & Plan Note (Signed)
I will recheck his CBC and will look at his SPEP to see if he has a lymphoproliferative disease

## 2011-03-22 NOTE — Assessment & Plan Note (Signed)
I will check his testosterone level 

## 2011-03-22 NOTE — Assessment & Plan Note (Signed)
He is doing well on cymbalta

## 2011-03-23 LAB — TESTOSTERONE, FREE, TOTAL, SHBG: Sex Hormone Binding: 52 nmol/L (ref 13–71)

## 2011-03-24 ENCOUNTER — Encounter: Payer: Self-pay | Admitting: Internal Medicine

## 2011-03-24 LAB — PROTEIN ELECTROPHORESIS, SERUM
Alpha-2-Globulin: 13.1 % — ABNORMAL HIGH (ref 7.1–11.8)
Beta Globulin: 4.7 % (ref 4.7–7.2)
Gamma Globulin: 12.6 % (ref 11.1–18.8)

## 2011-03-25 ENCOUNTER — Encounter: Payer: Self-pay | Admitting: Endocrinology

## 2011-03-25 ENCOUNTER — Ambulatory Visit (INDEPENDENT_AMBULATORY_CARE_PROVIDER_SITE_OTHER): Payer: PRIVATE HEALTH INSURANCE | Admitting: Endocrinology

## 2011-03-25 DIAGNOSIS — K5289 Other specified noninfective gastroenteritis and colitis: Secondary | ICD-10-CM

## 2011-03-25 DIAGNOSIS — R112 Nausea with vomiting, unspecified: Secondary | ICD-10-CM

## 2011-03-25 DIAGNOSIS — K529 Noninfective gastroenteritis and colitis, unspecified: Secondary | ICD-10-CM

## 2011-03-25 MED ORDER — PROMETHAZINE HCL 25 MG/ML IJ SOLN
25.0000 mg | Freq: Once | INTRAMUSCULAR | Status: AC
  Administered 2011-03-25: 25 mg via INTRAMUSCULAR

## 2011-03-25 MED ORDER — PROMETHAZINE HCL 25 MG/ML IJ SOLN: 25.0000 mg | Freq: Four times a day (QID) | INTRAMUSCULAR | Status: DC | PRN

## 2011-03-25 MED ORDER — ONDANSETRON HCL 4 MG PO TABS: 4.0000 mg | ORAL_TABLET | Freq: Three times a day (TID) | ORAL | Status: DC | PRN

## 2011-03-25 MED ORDER — MEPERIDINE HCL 100 MG/ML IJ SOLN
100.0000 mg | Freq: Once | INTRAMUSCULAR | Status: AC
  Administered 2011-03-25: 100 mg via INTRAMUSCULAR

## 2011-03-25 MED ORDER — ONDANSETRON HCL 4 MG PO TABS: 4.0000 mg | ORAL_TABLET | Freq: Three times a day (TID) | ORAL | Status: AC | PRN

## 2011-03-25 NOTE — Patient Instructions (Addendum)
i have sent a prescription to your pharmacy, for an anti-nausea medication.   take tylenol, and drink plenty of fluids.  I hope you feel better soon.  If you don't feel better by next week, please call your doctor. Demerol 100 mg and phenergan 25 mg, IM.

## 2011-03-25 NOTE — Progress Notes (Signed)
  Subjective:    Patient ID: Erik Shaffer, male    DOB: November 09, 1965, 45 y.o.   MRN: [id number]  HPI Pt states 1 day of n/v/d/fever/chills.  He recently had c-spine neck surgery.  He say his entire back is very painful Past Medical History  Diagnosis Date  . Arthritis   . Kidney stones   . Ulcer   . Depression     Past Surgical History  Procedure Date  . Microdiscectomy lumbar     L5-S1  . Hemilaminotomy lumbar spine 06/24/2010    History   Social History  . Marital Status: Divorced    Spouse Name: N/A    Number of Children: N/A  . Years of Education: N/A   Occupational History  . maintenance    Social History Main Topics  . Smoking status: Current Everyday Smoker -- 0.2 packs/day for 30 years    Types: Cigarettes  . Smokeless tobacco: Not on file  . Alcohol Use: No  . Drug Use: No  . Sexually Active: Yes   Other Topics Concern  . Not on file   Social History Narrative   Caffienated drinks-yesSeat belt use often-yesRegular Exercise-noSmoke alarm in the home-yesFirearms/guns in the home-yesHistory of physical abuse-noHLE-11th grade    Current Outpatient Prescriptions on File Prior to Visit  Medication Sig Dispense Refill  . buPROPion (WELLBUTRIN XL) 150 MG 24 hr tablet Take 1 tablet (150 mg total) by mouth every morning.  30 tablet  11  . DULoxetine (CYMBALTA) 30 MG capsule Take 1 capsule (30 mg total) by mouth 2 (two) times daily.  60 capsule  11  . Methocarbamol (ROBAXIN PO) Take 1 tablet by mouth as needed.        . Oxycodone-Acetaminophen (PERCOCET PO) Take 1 tablet by mouth as needed.        . pregabalin (LYRICA) 50 MG capsule Take 1 capsule (50 mg total) by mouth 2 (two) times daily.  84 capsule  0    No Known Allergies  Family History  Problem Relation Age of Onset  . Arthritis Other   . COPD Other     Lung cancer  . Hyperlipidemia Other   . Diabetes Other    BP 110/78  Pulse 83  Temp(Src) 99.3 F (37.4 C) (Oral)  Ht 6' (1.829 m)  Wt 194 lb 9.6  oz (88.27 kg)  BMI 26.39 kg/m2  SpO2 97%   Review of Systems Denies brbpr    Objective:   Physical Exam VITAL SIGNS:  See vs page GENERAL: no distress Neck: there is a healing surgical site at the right anterior neck, which is nontender.   ABDOMEN: abdomen is soft, nontender.  no hepatosplenomegaly.   not distended.  no hernia.     Assessment & Plan:  Flu-like illness, new S/p c-spine surgery.  No evidence of infection at the operative site. Low-back pain, exac by viral illness.

## 2011-05-12 ENCOUNTER — Encounter: Payer: Self-pay | Admitting: Internal Medicine

## 2011-05-12 ENCOUNTER — Ambulatory Visit (INDEPENDENT_AMBULATORY_CARE_PROVIDER_SITE_OTHER): Payer: PRIVATE HEALTH INSURANCE | Admitting: Internal Medicine

## 2011-05-12 DIAGNOSIS — F17200 Nicotine dependence, unspecified, uncomplicated: Secondary | ICD-10-CM

## 2011-05-12 DIAGNOSIS — F329 Major depressive disorder, single episode, unspecified: Secondary | ICD-10-CM

## 2011-05-12 DIAGNOSIS — Z720 Tobacco use: Secondary | ICD-10-CM

## 2011-05-12 NOTE — Progress Notes (Signed)
  Subjective:    Patient ID: Erik Shaffer, male    DOB: 01/30/1966, 46 y.o.   MRN: [id number]  HPI He returns for f/up and tells me that he is doing well. His recent labs show that his WBC is normal.   Review of Systems  Constitutional: Negative.   HENT: Negative.   Eyes: Negative.   Respiratory: Negative.   Cardiovascular: Negative.   Gastrointestinal: Negative.   Genitourinary: Negative.   Musculoskeletal: Negative.   Skin: Negative.   Neurological: Negative.   Hematological: Negative.   Psychiatric/Behavioral: Negative.        Objective:   Physical Exam  Vitals reviewed. Constitutional: He is oriented to person, place, and time. He appears well-developed and well-nourished. No distress.  HENT:  Head: Normocephalic and atraumatic.  Mouth/Throat: Oropharynx is clear and moist. No oropharyngeal exudate.  Eyes: Conjunctivae are normal. Right eye exhibits no discharge. Left eye exhibits no discharge. No scleral icterus.  Neck: Normal range of motion. Neck supple. No JVD present. No tracheal deviation present. No thyromegaly present.  Cardiovascular: Normal rate, regular rhythm, normal heart sounds and intact distal pulses.  Exam reveals no gallop and no friction rub.   No murmur heard. Pulmonary/Chest: Effort normal and breath sounds normal. No stridor. No respiratory distress. He has no wheezes. He has no rales. He exhibits no tenderness.  Abdominal: Soft. Bowel sounds are normal. He exhibits no distension and no mass. There is no tenderness. There is no rebound and no guarding.  Musculoskeletal: Normal range of motion. He exhibits no edema and no tenderness.  Lymphadenopathy:    He has no cervical adenopathy.  Neurological: He is oriented to person, place, and time.  Skin: Skin is warm and dry. No rash noted. He is not diaphoretic. No erythema. No pallor.  Psychiatric: He has a normal mood and affect. His behavior is normal. Judgment and thought content normal.      Lab  Results  Component Value Date   WBC 10.1 03/22/2011   HGB 14.3 03/22/2011   HCT 41.9 03/22/2011   PLT 355.0 03/22/2011   GLUCOSE 72 03/22/2011   CHOL 244* 03/22/2011   TRIG 257.0* 03/22/2011   HDL 40.70 03/22/2011   LDLDIRECT 163.0 03/22/2011   ALT 13 03/22/2011   AST 16 03/22/2011   NA 138 03/22/2011   K 3.8 03/22/2011   CL 102 03/22/2011   CREATININE 0.9 03/22/2011   BUN 8 03/22/2011   CO2 27 03/22/2011   TSH 0.92 03/17/2011   INR 1.06 06/19/2010      Assessment & Plan:

## 2011-05-14 ENCOUNTER — Encounter: Payer: Self-pay | Admitting: Internal Medicine

## 2011-05-14 NOTE — Assessment & Plan Note (Signed)
He agrees to quit smoking 

## 2011-05-14 NOTE — Patient Instructions (Signed)
Smoking Cessation This document explains the best ways for you to quit smoking and new treatments to help. It lists new medicines that can double or triple your chances of quitting and quitting for good. It also considers ways to avoid relapses and concerns you may have about quitting, including weight gain. NICOTINE: A POWERFUL ADDICTION If you have tried to quit smoking, you know how hard it can be. It is hard because nicotine is a very addictive drug. For some people, it can be as addictive as heroin or cocaine. Usually, people make 2 or 3 tries, or more, before finally being able to quit. Each time you try to quit, you can learn about what helps and what hurts. Quitting takes hard work and a lot of effort, but you can quit smoking. QUITTING SMOKING IS ONE OF THE MOST IMPORTANT THINGS YOU WILL EVER DO.  You will live longer, feel better, and live better.   The impact on your body of quitting smoking is felt almost immediately:   Within 20 minutes, blood pressure decreases. Pulse returns to its normal level.   After 8 hours, carbon monoxide levels in the blood return to normal. Oxygen level increases.   After 24 hours, chance of heart attack starts to decrease. Breath, hair, and body stop smelling like smoke.   After 48 hours, damaged nerve endings begin to recover. Sense of taste and smell improve.   After 72 hours, the body is virtually free of nicotine. Bronchial tubes relax and breathing becomes easier.   After 2 to 12 weeks, lungs can hold more air. Exercise becomes easier and circulation improves.   Quitting will reduce your risk of having a heart attack, stroke, cancer, or lung disease:   After 1 year, the risk of coronary heart disease is cut in half.   After 5 years, the risk of stroke falls to the same as a nonsmoker.   After 10 years, the risk of lung cancer is cut in half and the risk of other cancers decreases significantly.   After 15 years, the risk of coronary heart  disease drops, usually to the level of a nonsmoker.   If you are pregnant, quitting smoking will improve your chances of having a healthy baby.   The people you live with, especially your children, will be healthier.   You will have extra money to spend on things other than cigarettes.  FIVE KEYS TO QUITTING Studies have shown that these 5 steps will help you quit smoking and quit for good. You have the best chances of quitting if you use them together: 1. Get ready.  2. Get support and encouragement.  3. Learn new skills and behaviors.  4. Get medicine to reduce your nicotine addiction and use it correctly.  5. Be prepared for relapse or difficult situations. Be determined to continue trying to quit, even if you do not succeed at first.  1. GET READY  Set a quit date.   Change your environment.   Get rid of ALL cigarettes, ashtrays, matches, and lighters in your home, car, and place of work.   Do not let people smoke in your home.   Review your past attempts to quit. Think about what worked and what did not.   Once you quit, do not smoke. NOT EVEN A PUFF!  2. GET SUPPORT AND ENCOURAGEMENT Studies have shown that you have a better chance of being successful if you have help. You can get support in many ways.  Tell   your family, friends, and coworkers that you are going to quit and need their support. Ask them not to smoke around you.   Talk to your caregivers (doctor, dentist, nurse, pharmacist, psychologist, and/or smoking counselor).   Get individual, group, or telephone counseling and support. The more counseling you have, the better your chances are of quitting. Programs are available at local hospitals and health centers. Call your local health department for information about programs in your area.   Spiritual beliefs and practices may help some smokers quit.   Quit meters are small computer programs online or downloadable that keep track of quit statistics, such as amount  of "quit-time," cigarettes not smoked, and money saved.   Many smokers find one or more of the many self-help books available useful in helping them quit and stay off tobacco.  3. LEARN NEW SKILLS AND BEHAVIORS  Try to distract yourself from urges to smoke. Talk to someone, go for a walk, or occupy your time with a task.   When you first try to quit, change your routine. Take a different route to work. Drink tea instead of coffee. Eat breakfast in a different place.   Do something to reduce your stress. Take a hot bath, exercise, or read a book.   Plan something enjoyable to do every day. Reward yourself for not smoking.   Explore interactive web-based programs that specialize in helping you quit.  4. GET MEDICINE AND USE IT CORRECTLY Medicines can help you stop smoking and decrease the urge to smoke. Combining medicine with the above behavioral methods and support can quadruple your chances of successfully quitting smoking. The [place] Food and Drug Administration (FDA) has approved 7 medicines to help you quit smoking. These medicines fall into 3 categories.  Nicotine replacement therapy (delivers nicotine to your body without the negative effects and risks of smoking):   Nicotine gum: Available over-the-counter.   Nicotine lozenges: Available over-the-counter.   Nicotine inhaler: Available by prescription.   Nicotine nasal spray: Available by prescription.   Nicotine skin patches (transdermal): Available by prescription and over-the-counter.   Antidepressant medicine (helps people abstain from smoking, but how this works is unknown):   Bupropion sustained-release (SR) tablets: Available by prescription.   Nicotinic receptor partial agonist (simulates the effect of nicotine in your brain):   Varenicline tartrate tablets: Available by prescription.   Ask your caregiver for advice about which medicines to use and how to use them. Carefully read the information on the package.    Everyone who is trying to quit may benefit from using a medicine. If you are pregnant or trying to become pregnant, nursing an infant, you are under age 18, or you smoke fewer than 10 cigarettes per day, talk to your caregiver before taking any nicotine replacement medicines.   You should stop using a nicotine replacement product and call your caregiver if you experience nausea, dizziness, weakness, vomiting, fast or irregular heartbeat, mouth problems with the lozenge or gum, or redness or swelling of the skin around the patch that does not go away.   Do not use any other product containing nicotine while using a nicotine replacement product.   Talk to your caregiver before using these products if you have diabetes, heart disease, asthma, stomach ulcers, you had a recent heart attack, you have high blood pressure that is not controlled with medicine, a history of irregular heartbeat, or you have been prescribed medicine to help you quit smoking.  5. BE PREPARED FOR RELAPSE OR   DIFFICULT SITUATIONS  Most relapses occur within the first 3 months after quitting. Do not be discouraged if you start smoking again. Remember, most people try several times before they finally quit.   You may have symptoms of withdrawal because your body is used to nicotine. You may crave cigarettes, be irritable, feel very hungry, cough often, get headaches, or have difficulty concentrating.   The withdrawal symptoms are only temporary. They are strongest when you first quit, but they will go away within 10 to 14 days.  Here are some difficult situations to watch for:  Alcohol. Avoid drinking alcohol. Drinking lowers your chances of successfully quitting.   Caffeine. Try to reduce the amount of caffeine you consume. It also lowers your chances of successfully quitting.   Other smokers. Being around smoking can make you want to smoke. Avoid smokers.   Weight gain. Many smokers will gain weight when they quit, usually  less than 10 pounds. Eat a healthy diet and stay active. Do not let weight gain distract you from your main goal, quitting smoking. Some medicines that help you quit smoking may also help delay weight gain. You can always lose the weight gained after you quit.   Bad mood or depression. There are a lot of ways to improve your mood other than smoking.  If you are having problems with any of these situations, talk to your caregiver. SPECIAL SITUATIONS AND CONDITIONS Studies suggest that everyone can quit smoking. Your situation or condition can give you a special reason to quit.  Pregnant women/new mothers: By quitting, you protect your baby's health and your own.   Hospitalized patients: By quitting, you reduce health problems and help healing.   Heart attack patients: By quitting, you reduce your risk of a second heart attack.   Lung, head, and neck cancer patients: By quitting, you reduce your chance of a second cancer.   Parents of children and adolescents: By quitting, you protect your children from illnesses caused by secondhand smoke.  QUESTIONS TO THINK ABOUT Think about the following questions before you try to stop smoking. You may want to talk about your answers with your caregiver.  Why do you want to quit?   If you tried to quit in the past, what helped and what did not?   What will be the most difficult situations for you after you quit? How will you plan to handle them?   Who can help you through the tough times? Your family? Friends? Caregiver?   What pleasures do you get from smoking? What ways can you still get pleasure if you quit?  Here are some questions to ask your caregiver:  How can you help me to be successful at quitting?   What medicine do you think would be best for me and how should I take it?   What should I do if I need more help?   What is smoking withdrawal like? How can I get information on withdrawal?  Quitting takes hard work and a lot of effort,  but you can quit smoking. FOR MORE INFORMATION  Smokefree.gov (http://www.smokefree.gov) provides free, accurate, evidence-based information and professional assistance to help support the immediate and long-term needs of people trying to quit smoking. Document Released: 03/30/2001 Document Revised: 12/16/2010 Document Reviewed: 01/20/2009 [employer] Patient Information 2012 [employer]. 

## 2011-05-14 NOTE — Assessment & Plan Note (Signed)
He is doing well on cymbalta so will continue it for now

## 2011-05-25 ENCOUNTER — Other Ambulatory Visit: Payer: Self-pay | Admitting: Neurological Surgery

## 2011-05-25 ENCOUNTER — Ambulatory Visit
Admission: RE | Admit: 2011-05-25 | Discharge: 2011-05-25 | Disposition: A | Discharge status: Home / Self Care | Payer: PRIVATE HEALTH INSURANCE | Source: Ambulatory Visit | Attending: Neurological Surgery | Admitting: Neurological Surgery

## 2011-05-25 DIAGNOSIS — M542 Cervicalgia: Secondary | ICD-10-CM

## 2011-06-23 ENCOUNTER — Ambulatory Visit (INDEPENDENT_AMBULATORY_CARE_PROVIDER_SITE_OTHER): Payer: PRIVATE HEALTH INSURANCE | Admitting: Internal Medicine

## 2011-06-23 ENCOUNTER — Ambulatory Visit (INDEPENDENT_AMBULATORY_CARE_PROVIDER_SITE_OTHER)
Admission: RE | Admit: 2011-06-23 | Discharge: 2011-06-23 | Disposition: A | Discharge status: Home / Self Care | Payer: PRIVATE HEALTH INSURANCE | Source: Ambulatory Visit | Attending: Internal Medicine | Admitting: Internal Medicine

## 2011-06-23 ENCOUNTER — Encounter: Payer: Self-pay | Admitting: Internal Medicine

## 2011-06-23 DIAGNOSIS — J209 Acute bronchitis, unspecified: Secondary | ICD-10-CM

## 2011-06-23 DIAGNOSIS — F329 Major depressive disorder, single episode, unspecified: Secondary | ICD-10-CM

## 2011-06-23 DIAGNOSIS — F17200 Nicotine dependence, unspecified, uncomplicated: Secondary | ICD-10-CM

## 2011-06-23 DIAGNOSIS — R05 Cough: Secondary | ICD-10-CM

## 2011-06-23 DIAGNOSIS — Z720 Tobacco use: Secondary | ICD-10-CM

## 2011-06-23 DIAGNOSIS — F3289 Other specified depressive episodes: Secondary | ICD-10-CM

## 2011-06-23 DIAGNOSIS — M25519 Pain in unspecified shoulder: Secondary | ICD-10-CM

## 2011-06-23 DIAGNOSIS — R059 Cough, unspecified: Secondary | ICD-10-CM | POA: Insufficient documentation

## 2011-06-23 DIAGNOSIS — M898X1 Other specified disorders of bone, shoulder: Secondary | ICD-10-CM | POA: Insufficient documentation

## 2011-06-23 MED ORDER — HYDROCOD POLST-CPM POLST ER 10-8 MG PO CP12: 1.0000 | ORAL_CAPSULE | Freq: Two times a day (BID) | ORAL | Status: DC | PRN

## 2011-06-23 MED ORDER — DULOXETINE HCL 60 MG PO CPEP: 60.0000 mg | ORAL_CAPSULE | Freq: Every day | ORAL | Status: DC

## 2011-06-23 MED ORDER — AZITHROMYCIN 500 MG PO TABS: 500.0000 mg | ORAL_TABLET | Freq: Every day | ORAL | Status: AC

## 2011-06-23 MED ORDER — OXYCODONE-ACETAMINOPHEN 7.5-325 MG PO TABS: 1.0000 | ORAL_TABLET | Freq: Four times a day (QID) | ORAL | Status: DC | PRN

## 2011-06-23 NOTE — Assessment & Plan Note (Signed)
I will check a CXR to look for pna, mass, edema, etc. 

## 2011-06-23 NOTE — Progress Notes (Signed)
Subjective:    Patient ID: Erik Shaffer, male    DOB: Oct 10, 1965, 46 y.o.   MRN: [id number]  Cough This is a recurrent problem. The current episode started more than 1 month ago. The problem has been gradually worsening. The problem occurs every few hours. The cough is productive of brown sputum. Associated symptoms include nasal congestion, postnasal drip and rhinorrhea. Pertinent negatives include no chest pain, chills, ear congestion, ear pain, fever, headaches, heartburn, hemoptysis, myalgias, rash, sore throat, shortness of breath, sweats, weight loss or wheezing. The symptoms are aggravated by nothing. Risk factors for lung disease include smoking/tobacco exposure. He has tried prescription cough suppressant for the symptoms. The treatment provided moderate relief.      Review of Systems  Constitutional: Negative for fever, chills, weight loss, diaphoresis, activity change, appetite change, fatigue and unexpected weight change.  HENT: Positive for rhinorrhea, postnasal drip and sinus pressure. Negative for hearing loss, ear pain, nosebleeds, congestion, sore throat, facial swelling, sneezing, trouble swallowing, neck pain, neck stiffness, voice change, tinnitus and ear discharge.   Eyes: Negative.   Respiratory: Positive for cough. Negative for apnea, hemoptysis, choking, chest tightness, shortness of breath, wheezing and stridor.   Cardiovascular: Negative for chest pain, palpitations and leg swelling.  Gastrointestinal: Negative for heartburn, nausea, vomiting, abdominal pain, diarrhea, constipation, blood in stool and abdominal distention.  Genitourinary: Negative.   Musculoskeletal: Positive for arthralgias (he has pain around his right shoulder blade after lifting an object yesterday). Negative for myalgias, back pain, joint swelling and gait problem.  Skin: Negative for color change, pallor, rash and wound.  Neurological: Negative for dizziness, tremors, seizures, syncope, facial  asymmetry, speech difficulty, weakness, light-headedness, numbness and headaches.  Hematological: Negative for adenopathy. Does not bruise/bleed easily.  Psychiatric/Behavioral: Positive for dysphoric mood and decreased concentration. Negative for suicidal ideas, hallucinations, behavioral problems, confusion, sleep disturbance, self-injury and agitation. The patient is not nervous/anxious and is not hyperactive.        Objective:   Physical Exam  Vitals reviewed. Constitutional: He is oriented to person, place, and time. He appears well-developed and well-nourished. No distress.  HENT:  Head: Normocephalic and atraumatic.  Mouth/Throat: Oropharynx is clear and moist. No oropharyngeal exudate.  Eyes: Conjunctivae are normal. Right eye exhibits no discharge. Left eye exhibits no discharge. No scleral icterus.  Neck: Normal range of motion. Neck supple. No JVD present. No tracheal deviation present. No thyromegaly present.  Cardiovascular: Normal rate, regular rhythm, normal heart sounds and intact distal pulses.  Exam reveals no gallop and no friction rub.   No murmur heard. Pulmonary/Chest: Effort normal and breath sounds normal. No stridor. No respiratory distress. He has no wheezes. He has no rales. He exhibits no tenderness.  Abdominal: Soft. Bowel sounds are normal. He exhibits no distension and no mass. There is no tenderness. There is no rebound and no guarding.  Musculoskeletal: Normal range of motion. He exhibits no edema and no tenderness.       Right shoulder: Normal. He exhibits normal range of motion, no tenderness, no bony tenderness, no swelling, no effusion, no crepitus, no deformity, no laceration, no pain, no spasm, normal pulse and normal strength.       Thoracic back: Normal. He exhibits normal range of motion, no tenderness, no bony tenderness, no swelling, no edema, no deformity, no laceration, no pain, no spasm and normal pulse.  Lymphadenopathy:    He has no cervical  adenopathy.  Neurological: He is oriented to person, place, and time.  Skin: Skin is warm and dry. No rash noted. He is not diaphoretic. No erythema. No pallor.  Psychiatric: He has a normal mood and affect. His behavior is normal. Judgment and thought content normal.     Lab Results  Component Value Date   WBC 10.1 03/22/2011   HGB 14.3 03/22/2011   HCT 41.9 03/22/2011   PLT 355.0 03/22/2011   GLUCOSE 72 03/22/2011   CHOL 244* 03/22/2011   TRIG 257.0* 03/22/2011   HDL 40.70 03/22/2011   LDLDIRECT 163.0 03/22/2011   ALT 13 03/22/2011   AST 16 03/22/2011   NA 138 03/22/2011   K 3.8 03/22/2011   CL 102 03/22/2011   CREATININE 0.9 03/22/2011   BUN 8 03/22/2011   CO2 27 03/22/2011   TSH 0.92 03/17/2011   INR 1.06 06/19/2010       Assessment & Plan:

## 2011-06-23 NOTE — Assessment & Plan Note (Signed)
Start zpak and a cough suppressant 

## 2011-06-23 NOTE — Assessment & Plan Note (Signed)
He will add nsaids and percocet to the muscle relaxer

## 2011-06-23 NOTE — Patient Instructions (Signed)
Musculoskeletal Pain Musculoskeletal pain is muscle and boney aches and pains. These pains can occur in any part of the body. Your caregiver may treat you without knowing the cause of the pain. They may treat you if blood or urine tests, X-rays, and other tests were normal.  CAUSES There is often not a definite cause or reason for these pains. These pains may be caused by a type of germ (virus). The discomfort may also come from overuse. Overuse includes working out too hard when your body is not fit. Boney aches also come from weather changes. Bone is sensitive to atmospheric pressure changes. HOME CARE INSTRUCTIONS   Ask when your test results will be ready. Make sure you get your test results.   Only take over-the-counter or prescription medicines for pain, discomfort, or fever as directed by your caregiver. If you were given medications for your condition, do not drive, operate machinery or power tools, or sign legal documents for 24 hours. Do not drink alcohol. Do not take sleeping pills or other medications that may interfere with treatment.   Continue all activities unless the activities cause more pain. When the pain lessens, slowly resume normal activities. Gradually increase the intensity and duration of the activities or exercise.   During periods of severe pain, bed rest may be helpful. Lay or sit in any position that is comfortable.   Putting ice on the injured area.   Put ice in a bag.   Place a towel between your skin and the bag.   Leave the ice on for 15 to 20 minutes, 3 to 4 times a day.   Follow up with your caregiver for continued problems and no reason can be found for the pain. If the pain becomes worse or does not go away, it may be necessary to repeat tests or do additional testing. Your caregiver may need to look further for a possible cause.  SEEK IMMEDIATE MEDICAL CARE IF:  You have pain that is getting worse and is not relieved by medications.   You develop  chest pain that is associated with shortness or breath, sweating, feeling sick to your stomach (nauseous), or throw up (vomit).   Your pain becomes localized to the abdomen.   You develop any new symptoms that seem different or that concern you.  MAKE SURE YOU:   Understand these instructions.   Will watch your condition.   Will get help right away if you are not doing well or get worse.  Document Released: 04/05/2005 Document Revised: 03/25/2011 Document Reviewed: 11/24/2007 [facility] Patient Information 2012 [place], [place].Acute Bronchitis You have acute bronchitis. This means you have a chest cold. The airways in your lungs are red and sore (inflamed). Acute means it is sudden onset.  CAUSES Bronchitis is most often caused by the same virus that causes a cold. SYMPTOMS   Body aches.   Chest congestion.   Chills.   Cough.   Fever.   Shortness of breath.   Sore throat.  TREATMENT  Acute bronchitis is usually treated with rest, fluids, and medicines for relief of fever or cough. Most symptoms should go away after a few days or a week. Increased fluids may help thin your secretions and will prevent dehydration. Your caregiver may give you an inhaler to improve your symptoms. The inhaler reduces shortness of breath and helps control cough. You can take over-the-counter pain relievers or cough medicine to decrease coughing, pain, or fever. A cool-air vaporizer may help thin bronchial secretions  and make it easier to clear your chest. Antibiotics are usually not needed but can be prescribed if you smoke, are seriously ill, have chronic lung problems, are elderly, or you are at higher risk for developing complications.Allergies and asthma can make bronchitis worse. Repeated episodes of bronchitis may cause longstanding lung problems. Avoid smoking and secondhand smoke.Exposure to cigarette smoke or irritating chemicals will make bronchitis worse. If you are a cigarette smoker,  consider using nicotine gum or skin patches to help control withdrawal symptoms. Quitting smoking will help your lungs heal faster. Recovery from bronchitis is often slow, but you should start feeling better after 2 to 3 days. Cough from bronchitis frequently lasts for 3 to 4 weeks. To prevent another bout of acute bronchitis:  Quit smoking.   Wash your hands frequently to get rid of viruses or use a hand sanitizer.   Avoid other people with cold or virus symptoms.   Try not to touch your hands to your mouth, nose, or eyes.  SEEK IMMEDIATE MEDICAL CARE IF:  You develop increased fever, chills, or chest pain.   You have severe shortness of breath or bloody sputum.   You develop dehydration, fainting, repeated vomiting, or a severe headache.   You have no improvement after 1 week of treatment or you get worse.  MAKE SURE YOU:   Understand these instructions.   Will watch your condition.   Will get help right away if you are not doing well or get worse.  Document Released: 05/13/2004 Document Revised: 03/25/2011 Document Reviewed: 07/29/2010 [facility] Patient Information 2012 [place], [place].Smoking Cessation This document explains the best ways for you to quit smoking and new treatments to help. It lists new medicines that can double or triple your chances of quitting and quitting for good. It also considers ways to avoid relapses and concerns you may have about quitting, including weight gain. NICOTINE: A POWERFUL ADDICTION If you have tried to quit smoking, you know how hard it can be. It is hard because nicotine is a very addictive drug. For some people, it can be as addictive as heroin or cocaine. Usually, people make 2 or 3 tries, or more, before finally being able to quit. Each time you try to quit, you can learn about what helps and what hurts. Quitting takes hard work and a lot of effort, but you can quit smoking. QUITTING SMOKING IS ONE OF THE MOST IMPORTANT THINGS YOU WILL EVER  DO.  You will live longer, feel better, and live better.   The impact on your body of quitting smoking is felt almost immediately:   Within 20 minutes, blood pressure decreases. Pulse returns to its normal level.   After 8 hours, carbon monoxide levels in the blood return to normal. Oxygen level increases.   After 24 hours, chance of heart attack starts to decrease. Breath, hair, and body stop smelling like smoke.   After 48 hours, damaged nerve endings begin to recover. Sense of taste and smell improve.   After 72 hours, the body is virtually free of nicotine. Bronchial tubes relax and breathing becomes easier.   After 2 to 12 weeks, lungs can hold more air. Exercise becomes easier and circulation improves.   Quitting will reduce your risk of having a heart attack, stroke, cancer, or lung disease:   After 1 year, the risk of coronary heart disease is cut in half.   After 5 years, the risk of stroke falls to the same as a nonsmoker.  After 10 years, the risk of lung cancer is cut in half and the risk of other cancers decreases significantly.   After 15 years, the risk of coronary heart disease drops, usually to the level of a nonsmoker.   If you are pregnant, quitting smoking will improve your chances of having a healthy baby.   The people you live with, especially your children, will be healthier.   You will have extra money to spend on things other than cigarettes.  FIVE KEYS TO QUITTING Studies have shown that these 5 steps will help you quit smoking and quit for good. You have the best chances of quitting if you use them together: 1. Get ready.  2. Get support and encouragement.  3. Learn new skills and behaviors.  4. Get medicine to reduce your nicotine addiction and use it correctly.  5. Be prepared for relapse or difficult situations. Be determined to continue trying to quit, even if you do not succeed at first.  1. GET READY  Set a quit date.   Change your  environment.   Get rid of ALL cigarettes, ashtrays, matches, and lighters in your home, car, and place of work.   Do not let people smoke in your home.   Review your past attempts to quit. Think about what worked and what did not.   Once you quit, do not smoke. NOT EVEN A PUFF!  2. GET SUPPORT AND ENCOURAGEMENT Studies have shown that you have a better chance of being successful if you have help. You can get support in many ways.  Tell your family, friends, and coworkers that you are going to quit and need their support. Ask them not to smoke around you.   Talk to your caregivers (doctor, dentist, nurse, pharmacist, psychologist, and/or smoking counselor).   Get individual, group, or telephone counseling and support. The more counseling you have, the better your chances are of quitting. Programs are available at [employer] and health centers. Call your local health department for information about programs in your area.   Spiritual beliefs and practices may help some smokers quit.   Quit meters are Photographer that keep track of quit statistics, such as amount of "quit-time," cigarettes not smoked, and money saved.   Many smokers find one or more of the many self-help books available useful in helping them quit and stay off tobacco.  3. LEARN NEW SKILLS AND BEHAVIORS  Try to distract yourself from urges to smoke. Talk to someone, go for a walk, or occupy your time with a task.   When you first try to quit, change your routine. Take a different route to work. Drink tea instead of coffee. Eat breakfast in a different place.   Do something to reduce your stress. Take a hot bath, exercise, or read a book.   Plan something enjoyable to do every day. Reward yourself for not smoking.   Explore interactive web-based programs that specialize in helping you quit.  4. GET MEDICINE AND USE IT CORRECTLY Medicines can help you stop smoking and decrease the  urge to smoke. Combining medicine with the above behavioral methods and support can quadruple your chances of successfully quitting smoking. The U[place] Food and Drug Administration (FDA) has approved 7 medicines to help you quit smoking. These medicines fall into 3 categories.  Nicotine replacement therapy (delivers nicotine to your body without the negative effects and risks of smoking):   Nicotine gum: Available over-the-counter.   Nicotine  lozenges: Available over-the-counter.   Nicotine inhaler: Available by prescription.   Nicotine nasal spray: Available by prescription.   Nicotine skin patches (transdermal): Available by prescription and over-the-counter.   Antidepressant medicine (helps people abstain from smoking, but how this works is unknown):   Bupropion sustained-release (SR) tablets: Available by prescription.   Nicotinic receptor partial agonist (simulates the effect of nicotine in your brain):   Varenicline tartrate tablets: Available by prescription.   Ask your caregiver for advice about which medicines to use and how to use them. Carefully read the information on the package.   Everyone who is trying to quit may benefit from using a medicine. If you are pregnant or trying to become pregnant, nursing an infant, you are under age 51, or you smoke fewer than 10 cigarettes per day, talk to your caregiver before taking any nicotine replacement medicines.   You should stop using a nicotine replacement product and call your caregiver if you experience nausea, dizziness, weakness, vomiting, fast or irregular heartbeat, mouth problems with the lozenge or gum, or redness or swelling of the skin around the patch that does not go away.   Do not use any other product containing nicotine while using a nicotine replacement product.   Talk to your caregiver before using these products if you have diabetes, heart disease, asthma, stomach ulcers, you had a recent heart attack, you have  high blood pressure that is not controlled with medicine, a history of irregular heartbeat, or you have been prescribed medicine to help you quit smoking.  5. BE PREPARED FOR RELAPSE OR DIFFICULT SITUATIONS  Most relapses occur within the first 3 months after quitting. Do not be discouraged if you start smoking again. Remember, most people try several times before they finally quit.   You may have symptoms of withdrawal because your body is used to nicotine. You may crave cigarettes, be irritable, feel very hungry, cough often, get headaches, or have difficulty concentrating.   The withdrawal symptoms are only temporary. They are strongest when you first quit, but they will go away within 10 to 14 days.  Here are some difficult situations to watch for:  Alcohol. Avoid drinking alcohol. Drinking lowers your chances of successfully quitting.   Caffeine. Try to reduce the amount of caffeine you consume. It also lowers your chances of successfully quitting.   Other smokers. Being around smoking can make you want to smoke. Avoid smokers.   Weight gain. Many smokers will gain weight when they quit, usually less than 10 pounds. Eat a healthy diet and stay active. Do not let weight gain distract you from your main goal, quitting smoking. Some medicines that help you quit smoking may also help delay weight gain. You can always lose the weight gained after you quit.   Bad mood or depression. There are a lot of ways to improve your mood other than smoking.  If you are having problems with any of these situations, talk to your caregiver. SPECIAL SITUATIONS AND CONDITIONS Studies suggest that everyone can quit smoking. Your situation or condition can give you a special reason to quit.  Pregnant women/new mothers: By quitting, you protect your baby's health and your own.   Hospitalized patients: By quitting, you reduce health problems and help healing.   Heart attack patients: By quitting, you reduce  your risk of a second heart attack.   Lung, head, and neck cancer patients: By quitting, you reduce your chance of a second cancer.   Parents of  children and adolescents: By quitting, you protect your children from illnesses caused by secondhand smoke.  QUESTIONS TO THINK ABOUT Think about the following questions before you try to stop smoking. You may want to talk about your answers with your caregiver.  Why do you want to quit?   If you tried to quit in the past, what helped and what did not?   What will be the most difficult situations for you after you quit? How will you plan to handle them?   Who can help you through the tough times? Your family? Friends? Caregiver?   What pleasures do you get from smoking? What ways can you still get pleasure if you quit?  Here are some questions to ask your caregiver:  How can you help me to be successful at quitting?   What medicine do you think would be best for me and how should I take it?   What should I do if I need more help?   What is smoking withdrawal like? How can I get information on withdrawal?  Quitting takes hard work and a lot of effort, but you can quit smoking. FOR MORE INFORMATION  Smokefree.gov (http://www.davis-sullivan.com/) provides free, accurate, evidence-based information and professional assistance to help support the immediate and long-term needs of people trying to quit smoking. Document Released: 03/30/2001 Document Revised: 03/25/2011 Document Reviewed: 01/20/2009 [facility] Patient Information 2012 [place], [place].

## 2011-06-23 NOTE — Assessment & Plan Note (Signed)
After a discussion, he agrees to quit smoking using e-cigs and patches

## 2011-06-23 NOTE — Assessment & Plan Note (Signed)
Change cymbalta to 60 mg per day at his request

## 2011-07-02 ENCOUNTER — Other Ambulatory Visit: Payer: Self-pay | Admitting: Internal Medicine

## 2011-10-13 ENCOUNTER — Encounter: Payer: Self-pay | Admitting: Internal Medicine

## 2011-10-13 ENCOUNTER — Ambulatory Visit (INDEPENDENT_AMBULATORY_CARE_PROVIDER_SITE_OTHER): Payer: PRIVATE HEALTH INSURANCE | Admitting: Internal Medicine

## 2011-10-13 ENCOUNTER — Other Ambulatory Visit (INDEPENDENT_AMBULATORY_CARE_PROVIDER_SITE_OTHER): Payer: PRIVATE HEALTH INSURANCE

## 2011-10-13 VITALS — BP 130/74 | HR 69 | Temp 98.4°F | Resp 16 | Wt 182.2 lb

## 2011-10-13 DIAGNOSIS — R5383 Other fatigue: Secondary | ICD-10-CM

## 2011-10-13 DIAGNOSIS — M1710 Unilateral primary osteoarthritis, unspecified knee: Secondary | ICD-10-CM

## 2011-10-13 DIAGNOSIS — A09 Infectious gastroenteritis and colitis, unspecified: Secondary | ICD-10-CM | POA: Insufficient documentation

## 2011-10-13 DIAGNOSIS — R5381 Other malaise: Secondary | ICD-10-CM

## 2011-10-13 LAB — COMPREHENSIVE METABOLIC PANEL
AST: 23 U/L (ref 0–37)
Albumin: 4.2 g/dL (ref 3.5–5.2)
BUN: 5 mg/dL — ABNORMAL LOW (ref 6–23)
Calcium: 9.6 mg/dL (ref 8.4–10.5)
Chloride: 105 mEq/L (ref 96–112)
Creatinine, Ser: 0.4 mg/dL (ref 0.4–1.5)
GFR: 220.61 mL/min (ref >60.00)
Glucose, Bld: 91 mg/dL (ref 70–99)

## 2011-10-13 LAB — SEDIMENTATION RATE: Sed Rate: 19 mm/hr (ref 0–22)

## 2011-10-13 LAB — CBC WITH DIFFERENTIAL/PLATELET
Basophils Relative: 1.7 % (ref 0.0–3.0)
Eosinophils Relative: 1.1 % (ref 0.0–5.0)
Lymphocytes Relative: 26.5 % (ref 12.0–46.0)
MCV: 89.7 fl (ref 78.0–100.0)
Monocytes Relative: 8.8 % (ref 3.0–12.0)
Neutrophils Relative %: 61.9 % (ref 43.0–77.0)
Platelets: 404 10*3/uL — ABNORMAL HIGH (ref 150.0–400.0)
RBC: 4.83 Mil/uL (ref 4.22–5.81)
WBC: 10.6 10*3/uL — ABNORMAL HIGH (ref 4.5–10.5)

## 2011-10-13 LAB — TSH: TSH: 0.36 u[IU]/mL (ref 0.35–5.50)

## 2011-10-13 MED ORDER — HYDROCODONE-IBUPROFEN 10-200 MG PO TABS: 1.0000 | ORAL_TABLET | Freq: Three times a day (TID) | ORAL | Status: DC | PRN

## 2011-10-13 NOTE — Assessment & Plan Note (Signed)
He wants to continue the current meds for pain

## 2011-10-13 NOTE — Progress Notes (Signed)
Subjective:    Patient ID: Erik Shaffer, male    DOB: 1965/06/24, 46 y.o.   MRN: [id number]  Diarrhea  This is a new problem. The current episode started yesterday. The problem occurs 2 to 4 times per day. The problem has been unchanged. The stool consistency is described as watery. Associated symptoms include arthralgias (knees) and chills. Pertinent negatives include no abdominal pain, bloating, coughing, fever, headaches, increased  flatus, myalgias, sweats, URI, vomiting or weight loss. Nothing aggravates the symptoms. Risk factors include no known risk factors. He has tried nothing for the symptoms.      Review of Systems  Constitutional: Positive for chills and fatigue. Negative for fever, weight loss, diaphoresis, appetite change and unexpected weight change.  HENT: Positive for neck pain (chronic, unchanged). Negative for facial swelling and neck stiffness.   Eyes: Negative.   Respiratory: Negative for cough, chest tightness, shortness of breath, wheezing and stridor.   Cardiovascular: Negative for chest pain, palpitations and leg swelling.  Gastrointestinal: Positive for diarrhea. Negative for nausea, vomiting, abdominal pain, constipation, blood in stool, abdominal distention, anal bleeding, rectal pain, bloating and flatus.  Genitourinary: Negative.   Musculoskeletal: Positive for back pain (chronic, unchanged) and arthralgias (knees). Negative for myalgias, joint swelling and gait problem.  Skin: Negative for color change, rash and wound.  Neurological: Negative for dizziness, tremors, seizures, syncope, facial asymmetry, speech difficulty, weakness, light-headedness, numbness and headaches.  Hematological: Negative for adenopathy. Does not bruise/bleed easily.  Psychiatric/Behavioral: Positive for dysphoric mood. Negative for suicidal ideas, hallucinations, behavioral problems, confusion, disturbed wake/sleep cycle, self-injury, decreased concentration and agitation. The patient  is not nervous/anxious and is not hyperactive.        Objective:   Physical Exam  Vitals reviewed. Constitutional: He is oriented to person, place, and time. He appears well-developed and well-nourished.  Non-toxic appearance. He does not have a sickly appearance. He does not appear ill. No distress.  HENT:  Head: Normocephalic and atraumatic.  Mouth/Throat: Oropharynx is clear and moist. No oropharyngeal exudate.  Eyes: Conjunctivae are normal. Right eye exhibits no discharge. Left eye exhibits no discharge. No scleral icterus.  Neck: Normal range of motion. Neck supple. No JVD present. No tracheal deviation present. No thyromegaly present.  Cardiovascular: Normal rate, regular rhythm, normal heart sounds and intact distal pulses.  Exam reveals no gallop and no friction rub.   No murmur heard. Pulmonary/Chest: Effort normal and breath sounds normal. No stridor. No respiratory distress. He has no wheezes. He has no rales. He exhibits no tenderness.  Abdominal: Soft. Bowel sounds are normal. He exhibits no distension and no mass. There is no tenderness. There is no rebound and no guarding.  Musculoskeletal: Normal range of motion. He exhibits no edema and no tenderness.  Lymphadenopathy:    He has no cervical adenopathy.  Neurological: He is alert and oriented to person, place, and time. He has normal reflexes. He displays normal reflexes. No cranial nerve deficit. He exhibits normal muscle tone. Coordination normal.  Skin: Skin is warm and dry. No rash noted. He is not diaphoretic. No erythema. No pallor.  Psychiatric: He has a normal mood and affect. His behavior is normal. Judgment and thought content normal.     Lab Results  Component Value Date   WBC 10.1 03/22/2011   HGB 14.3 03/22/2011   HCT 41.9 03/22/2011   PLT 355.0 03/22/2011   GLUCOSE 72 03/22/2011   CHOL 244* 03/22/2011   TRIG 257.0* 03/22/2011   HDL 40.70 03/22/2011  LDLDIRECT 163.0 03/22/2011   ALT 13 03/22/2011   AST 16  03/22/2011   NA 138 03/22/2011   K 3.8 03/22/2011   CL 102 03/22/2011   CREATININE 0.9 03/22/2011   BUN 8 03/22/2011   CO2 27 03/22/2011   TSH 0.92 03/17/2011   INR 1.06 06/19/2010       Assessment & Plan:

## 2011-10-13 NOTE — Assessment & Plan Note (Signed)
He appears to have acute viral GE but we have some bacterial diarrheal illnesses so I have asked him to submit stool for testing, also I will check his labs to look for elevated WBC, dehydration, inflammation, etc.

## 2011-10-13 NOTE — Assessment & Plan Note (Signed)
I will check his labs to look for secondary causes of fatigue 

## 2011-10-13 NOTE — Patient Instructions (Signed)
Viral Gastroenteritis Viral gastroenteritis is also known as stomach flu. This condition affects the stomach and intestinal tract. It can cause sudden diarrhea and vomiting. The illness typically lasts 3 to 8 days. Most people develop an immune response that eventually gets rid of the virus. While this natural response develops, the virus can make you quite ill. CAUSES  Many different viruses can cause gastroenteritis, such as rotavirus or noroviruses. You can catch one of these viruses by consuming contaminated food or water. You may also catch a virus by sharing utensils or other personal items with an infected person or by touching a contaminated surface. SYMPTOMS  The most common symptoms are diarrhea and vomiting. These problems can cause a severe loss of body fluids (dehydration) and a body salt (electrolyte) imbalance. Other symptoms may include:  Fever.   Headache.   Fatigue.   Abdominal pain.  DIAGNOSIS  Your caregiver can usually diagnose viral gastroenteritis based on your symptoms and a physical exam. A stool sample may also be taken to test for the presence of viruses or other infections. TREATMENT  This illness typically goes away on its own. Treatments are aimed at rehydration. The most serious cases of viral gastroenteritis involve vomiting so severely that you are not able to keep fluids down. In these cases, fluids must be given through an intravenous line (IV). HOME CARE INSTRUCTIONS   Drink enough fluids to keep your urine clear or pale yellow. Drink small amounts of fluids frequently and increase the amounts as tolerated.   Ask your caregiver for specific rehydration instructions.   Avoid:   Foods high in sugar.   Alcohol.   Carbonated drinks.   Tobacco.   Juice.   Caffeine drinks.   Extremely hot or cold fluids.   Fatty, greasy foods.   Too much intake of anything at one time.   Dairy products until 24 to 48 hours after diarrhea stops.   You may  consume probiotics. Probiotics are active cultures of beneficial bacteria. They may lessen the amount and number of diarrheal stools in adults. Probiotics can be found in yogurt with active cultures and in supplements.   Wash your hands well to avoid spreading the virus.   Only take over-the-counter or prescription medicines for pain, discomfort, or fever as directed by your caregiver. Do not give aspirin to children. Antidiarrheal medicines are not recommended.   Ask your caregiver if you should continue to take your regular prescribed and over-the-counter medicines.   Keep all follow-up appointments as directed by your caregiver.  SEEK IMMEDIATE MEDICAL CARE IF:   You are unable to keep fluids down.   You do not urinate at least once every 6 to 8 hours.   You develop shortness of breath.   You notice blood in your stool or vomit. This may look like coffee grounds.   You have abdominal pain that increases or is concentrated in one small area (localized).   You have persistent vomiting or diarrhea.   You have a fever.   The patient is a child younger than 3 months, and he or she has a fever.   The patient is a child older than 3 months, and he or she has a fever and persistent symptoms.   The patient is a child older than 3 months, and he or she has a fever and symptoms suddenly get worse.   The patient is a baby, and he or she has no tears when crying.  MAKE SURE YOU:     Understand these instructions.   Will watch your condition.   Will get help right away if you are not doing well or get worse.  Document Released: 04/05/2005 Document Revised: 03/25/2011 Document Reviewed: 01/20/2011 [employer] Patient Information 2012 [employer]. 

## 2011-10-14 LAB — TESTOSTERONE, FREE, TOTAL, SHBG
Sex Hormone Binding: 64 nmol/L (ref 13–71)
Testosterone, Free: 105.1 pg/mL (ref 47.0–244.0)
Testosterone-% Free: 1.4 % — ABNORMAL LOW (ref 1.6–2.9)
Testosterone: 739.73 ng/dL (ref 300–890)

## 2011-10-15 ENCOUNTER — Telehealth: Payer: Self-pay

## 2011-10-15 NOTE — Telephone Encounter (Signed)
His WBC count was slightly elevated bu the other labs look good

## 2011-10-15 NOTE — Telephone Encounter (Signed)
Patient requesting lab results. Thanks

## 2011-10-20 ENCOUNTER — Ambulatory Visit (INDEPENDENT_AMBULATORY_CARE_PROVIDER_SITE_OTHER): Payer: PRIVATE HEALTH INSURANCE | Admitting: Internal Medicine

## 2011-10-20 ENCOUNTER — Encounter: Payer: Self-pay | Admitting: Internal Medicine

## 2011-10-20 VITALS — BP 122/72 | HR 73 | Temp 97.8°F | Resp 16 | Wt 190.0 lb

## 2011-10-20 DIAGNOSIS — IMO0002 Reserved for concepts with insufficient information to code with codable children: Secondary | ICD-10-CM

## 2011-10-20 DIAGNOSIS — M1710 Unilateral primary osteoarthritis, unspecified knee: Secondary | ICD-10-CM

## 2011-10-20 MED ORDER — OXYCODONE-ACETAMINOPHEN 10-650 MG PO TABS: 1.0000 | ORAL_TABLET | Freq: Three times a day (TID) | ORAL | Status: DC | PRN

## 2011-10-20 NOTE — Patient Instructions (Signed)
Degenerative Arthritis  You have osteoarthritis. This is the wear and tear arthritis that comes with aging. It is also called degenerative arthritis. This is common in people past middle age. It is caused by stress on the joints. The large weight bearing joints of the lower extremities are most often affected. The knees, hips, back, neck, and hands can become painful, swollen, and stiff. This is the most common type of arthritis. It comes on with age, carrying too much weight, or from an injury.  Treatment includes resting the sore joint until the pain and swelling improve. Crutches or a walker may be needed for severe flares. Only take over-the-counter or prescription medicines for pain, discomfort, or fever as directed by your caregiver. Local heat therapy may improve motion. Cortisone shots into the joint are sometimes used to reduce pain and swelling during flares.  Osteoarthritis is usually not crippling and progresses slowly. There are things you can do to decrease pain:  · Avoid high impact activities.  · Exercise regularly.  · Low impact exercises such as walking, biking and swimming help to keep the muscles strong and keep normal joint function.  · Stretching helps to keep your range of motion.  · Lose weight if you are overweight. This reduces joint stress.  In severe cases when you have pain at rest or increasing disability, joint surgery may be helpful. See your caregiver for follow-up treatment as recommended.   SEEK IMMEDIATE MEDICAL CARE IF:   · You have severe joint pain.  · Marked swelling and redness in your joint develops.  · You develop a high fever.  Document Released: 04/05/2005 Document Revised: 03/25/2011 Document Reviewed: 09/05/2006  [employer]® Patient Information ©2012 [employer].

## 2011-10-20 NOTE — Telephone Encounter (Signed)
Patient has follow up appt today, closing phone note

## 2011-10-20 NOTE — Progress Notes (Signed)
Subjective:    Patient ID: Erik Shaffer, male    DOB: 07/23/1965, 46 y.o.   MRN: [id number]  Arthritis Presents for follow-up visit. He complains of pain. He reports no stiffness, joint swelling or joint warmth. Affected locations include the neck, right knee and left knee. His pain is at a severity of 6/10. Pertinent negatives include no diarrhea, dry mouth, dysuria, fatigue, fever, pain at night, pain while resting, rash, uveitis or weight loss. Compliance with total regimen is 76-100%.      Review of Systems  Constitutional: Negative for fever, chills, weight loss, diaphoresis, activity change, appetite change, fatigue and unexpected weight change.  HENT: Positive for neck pain (chronic,unchanged). Negative for hearing loss, ear pain, nosebleeds, congestion, facial swelling, rhinorrhea, sneezing, neck stiffness, postnasal drip, sinus pressure, tinnitus and ear discharge.   Eyes: Negative.   Respiratory: Negative for apnea, cough, choking, chest tightness, shortness of breath, wheezing and stridor.   Cardiovascular: Negative for chest pain, palpitations and leg swelling.  Gastrointestinal: Negative for nausea, vomiting, abdominal pain, diarrhea, constipation, blood in stool, abdominal distention and anal bleeding.  Genitourinary: Negative.  Negative for dysuria.  Musculoskeletal: Positive for back pain (chronic, unchanged), arthralgias (both knees) and arthritis. Negative for myalgias, joint swelling, gait problem and stiffness.  Skin: Negative for color change, pallor, rash and wound.  Neurological: Negative.  Negative for dizziness.  Hematological: Negative for adenopathy. Does not bruise/bleed easily.  Psychiatric/Behavioral: Negative.  Negative for suicidal ideas, hallucinations, behavioral problems, confusion, disturbed wake/sleep cycle, self-injury, dysphoric mood, decreased concentration and agitation. The patient is not nervous/anxious and is not hyperactive.        Objective:   Physical Exam  Vitals reviewed. Constitutional: He is oriented to person, place, and time. He appears well-developed and well-nourished. No distress.  HENT:  Head: Normocephalic and atraumatic.  Mouth/Throat: Oropharynx is clear and moist. No oropharyngeal exudate.  Eyes: Conjunctivae are normal. Right eye exhibits no discharge. Left eye exhibits no discharge. No scleral icterus.  Neck: Normal range of motion. Neck supple. No JVD present. No tracheal deviation present. No thyromegaly present.  Cardiovascular: Normal rate, regular rhythm, normal heart sounds and intact distal pulses.  Exam reveals no gallop and no friction rub.   No murmur heard. Pulmonary/Chest: Effort normal and breath sounds normal. No stridor. No respiratory distress. He has no wheezes. He has no rales. He exhibits no tenderness.  Abdominal: Soft. Bowel sounds are normal. He exhibits no distension and no mass. There is no tenderness. There is no rebound and no guarding.  Musculoskeletal: Normal range of motion. He exhibits no edema and no tenderness.       Right knee: He exhibits normal range of motion, no swelling and no deformity. no tenderness found.       Left knee: Normal. He exhibits normal range of motion, no swelling and no deformity. no tenderness found.       Cervical back: Normal.       Lumbar back: Normal.  Lymphadenopathy:    He has no cervical adenopathy.  Neurological: He is alert and oriented to person, place, and time. He has normal reflexes. He displays normal reflexes. No cranial nerve deficit. He exhibits normal muscle tone. Coordination normal.  Skin: Skin is warm and dry. No rash noted. He is not diaphoretic. No erythema. No pallor.  Psychiatric: He has a normal mood and affect. His behavior is normal. Judgment and thought content normal.      Lab Results  Component Value Date  WBC 10.6* 10/13/2011   HGB 14.4 10/13/2011   HCT 43.4 10/13/2011   PLT 404.0* 10/13/2011   GLUCOSE 91 10/13/2011   CHOL  244* 03/22/2011   TRIG 257.0* 03/22/2011   HDL 40.70 03/22/2011   LDLDIRECT 163.0 03/22/2011   ALT 15 10/13/2011   AST 23 10/13/2011   NA 142 10/13/2011   K 3.8 10/13/2011   CL 105 10/13/2011   CREATININE 0.4 10/13/2011   BUN 5* 10/13/2011   CO2 29 10/13/2011   TSH 0.36 10/13/2011   INR 1.06 06/19/2010      Assessment & Plan:

## 2011-10-21 NOTE — Assessment & Plan Note (Signed)
He does not fell like the current meds have helped very so he resorted to taking up to 8 per day, he is willing to try percocet bid and will take otc nsaids in addition to this

## 2011-10-27 ENCOUNTER — Telehealth: Payer: Self-pay | Admitting: Internal Medicine

## 2011-10-27 NOTE — Telephone Encounter (Signed)
Request refill of hydrocodone-Ibuprofen(after talking with dr he made it last as long as possible) would like to know once sent

## 2011-10-28 ENCOUNTER — Ambulatory Visit (INDEPENDENT_AMBULATORY_CARE_PROVIDER_SITE_OTHER): Payer: PRIVATE HEALTH INSURANCE | Admitting: Internal Medicine

## 2011-10-28 ENCOUNTER — Encounter: Payer: Self-pay | Admitting: Internal Medicine

## 2011-10-28 VITALS — BP 124/68 | HR 74 | Temp 97.8°F | Resp 16 | Ht 72.0 in | Wt 186.0 lb

## 2011-10-28 DIAGNOSIS — M1710 Unilateral primary osteoarthritis, unspecified knee: Secondary | ICD-10-CM

## 2011-10-28 MED ORDER — OXYCODONE-ACETAMINOPHEN 10-650 MG PO TABS: 1.0000 | ORAL_TABLET | ORAL | Status: AC | PRN

## 2011-10-28 MED ORDER — METHYLPREDNISOLONE 4 MG PO KIT: PACK | ORAL | Status: AC

## 2011-10-28 NOTE — Patient Instructions (Signed)
Degenerative Arthritis  You have osteoarthritis. This is the wear and tear arthritis that comes with aging. It is also called degenerative arthritis. This is common in people past middle age. It is caused by stress on the joints. The large weight bearing joints of the lower extremities are most often affected. The knees, hips, back, neck, and hands can become painful, swollen, and stiff. This is the most common type of arthritis. It comes on with age, carrying too much weight, or from an injury.  Treatment includes resting the sore joint until the pain and swelling improve. Crutches or a walker may be needed for severe flares. Only take over-the-counter or prescription medicines for pain, discomfort, or fever as directed by your caregiver. Local heat therapy may improve motion. Cortisone shots into the joint are sometimes used to reduce pain and swelling during flares.  Osteoarthritis is usually not crippling and progresses slowly. There are things you can do to decrease pain:  · Avoid high impact activities.  · Exercise regularly.  · Low impact exercises such as walking, biking and swimming help to keep the muscles strong and keep normal joint function.  · Stretching helps to keep your range of motion.  · Lose weight if you are overweight. This reduces joint stress.  In severe cases when you have pain at rest or increasing disability, joint surgery may be helpful. See your caregiver for follow-up treatment as recommended.   SEEK IMMEDIATE MEDICAL CARE IF:   · You have severe joint pain.  · Marked swelling and redness in your joint develops.  · You develop a high fever.  Document Released: 04/05/2005 Document Revised: 03/25/2011 Document Reviewed: 09/05/2006  [employer]® Patient Information ©2012 [employer].

## 2011-10-28 NOTE — Assessment & Plan Note (Signed)
He is having a flare up so I have started him a medrol dose pak and will continue percocet as needed

## 2011-10-28 NOTE — Progress Notes (Signed)
  Subjective:    Patient ID: Erik Shaffer, male    DOB: 09/09/1965, 46 y.o.   MRN: [id number]  Arthritis Presents for follow-up visit. He complains of pain, stiffness and joint swelling. He reports no joint warmth. Affected locations include the right knee and left knee. His pain is at a severity of 7/10. Pertinent negatives include no diarrhea, dry eyes, dry mouth, dysuria, fatigue, fever, pain at night, pain while resting, rash, Raynaud's syndrome, uveitis or weight loss. Compliance with total regimen is 76-100%.      Review of Systems  Constitutional: Negative.  Negative for fever, weight loss and fatigue.  HENT: Negative.   Eyes: Negative.   Respiratory: Negative.   Cardiovascular: Negative.   Gastrointestinal: Negative.  Negative for diarrhea.  Genitourinary: Negative.  Negative for dysuria.  Musculoskeletal: Positive for joint swelling, arthralgias (both knees), arthritis and stiffness. Negative for myalgias, back pain and gait problem.  Skin: Negative.  Negative for rash.  Neurological: Negative.   Hematological: Negative.   Psychiatric/Behavioral: Negative.        Objective:   Physical Exam  Vitals reviewed. Constitutional: He is oriented to person, place, and time. He appears well-developed and well-nourished. No distress.  HENT:  Head: Normocephalic and atraumatic.  Mouth/Throat: Oropharynx is clear and moist. No oropharyngeal exudate.  Eyes: Conjunctivae are normal. Right eye exhibits no discharge. Left eye exhibits no discharge. No scleral icterus.  Neck: Normal range of motion. Neck supple. No JVD present. No tracheal deviation present. No thyromegaly present.  Cardiovascular: Normal rate, regular rhythm, normal heart sounds and intact distal pulses.  Exam reveals no gallop and no friction rub.   No murmur heard. Pulmonary/Chest: Effort normal and breath sounds normal. No stridor. No respiratory distress. He has no wheezes. He has no rales. He exhibits no tenderness.    Abdominal: Soft. Bowel sounds are normal. He exhibits no distension and no mass. There is no tenderness. There is no rebound and no guarding.  Musculoskeletal: Normal range of motion. He exhibits no edema and no tenderness.       Right knee: Normal.       Left knee: Normal.  Lymphadenopathy:    He has no cervical adenopathy.  Neurological: He is oriented to person, place, and time.  Skin: Skin is warm and dry. No rash noted. He is not diaphoretic. No erythema. No pallor.  Psychiatric: He has a normal mood and affect. His behavior is normal. Judgment and thought content normal.      Lab Results  Component Value Date   WBC 10.6* 10/13/2011   HGB 14.4 10/13/2011   HCT 43.4 10/13/2011   PLT 404.0* 10/13/2011   GLUCOSE 91 10/13/2011   CHOL 244* 03/22/2011   TRIG 257.0* 03/22/2011   HDL 40.70 03/22/2011   LDLDIRECT 163.0 03/22/2011   ALT 15 10/13/2011   AST 23 10/13/2011   NA 142 10/13/2011   K 3.8 10/13/2011   CL 105 10/13/2011   CREATININE 0.4 10/13/2011   BUN 5* 10/13/2011   CO2 29 10/13/2011   TSH 0.36 10/13/2011   INR 1.06 06/19/2010      Assessment & Plan:

## 2011-11-15 ENCOUNTER — Telehealth: Payer: Self-pay | Admitting: Internal Medicine

## 2011-11-15 NOTE — Telephone Encounter (Signed)
It is too soon 

## 2011-11-15 NOTE — Telephone Encounter (Signed)
Request refill on Oxycodone 10-550

## 2011-11-15 NOTE — Telephone Encounter (Signed)
Pt aware, making appt to discuss with dr

## 2011-11-16 ENCOUNTER — Encounter: Payer: Self-pay | Admitting: Internal Medicine

## 2011-11-16 ENCOUNTER — Ambulatory Visit (INDEPENDENT_AMBULATORY_CARE_PROVIDER_SITE_OTHER): Payer: PRIVATE HEALTH INSURANCE | Admitting: Internal Medicine

## 2011-11-16 VITALS — BP 118/72 | HR 74 | Temp 98.1°F | Resp 16 | Wt 191.5 lb

## 2011-11-16 DIAGNOSIS — M1710 Unilateral primary osteoarthritis, unspecified knee: Secondary | ICD-10-CM

## 2011-11-16 MED ORDER — OXYCODONE-ACETAMINOPHEN 10-650 MG PO TABS: 1.0000 | ORAL_TABLET | ORAL | Status: DC | PRN

## 2011-11-16 NOTE — Patient Instructions (Addendum)
Degenerative Arthritis  You have osteoarthritis. This is the wear and tear arthritis that comes with aging. It is also called degenerative arthritis. This is common in people past middle age. It is caused by stress on the joints. The large weight bearing joints of the lower extremities are most often affected. The knees, hips, back, neck, and hands can become painful, swollen, and stiff. This is the most common type of arthritis. It comes on with age, carrying too much weight, or from an injury.  Treatment includes resting the sore joint until the pain and swelling improve. Crutches or a walker may be needed for severe flares. Only take over-the-counter or prescription medicines for pain, discomfort, or fever as directed by your caregiver. Local heat therapy may improve motion. Cortisone shots into the joint are sometimes used to reduce pain and swelling during flares.  Osteoarthritis is usually not crippling and progresses slowly. There are things you can do to decrease pain:  · Avoid high impact activities.  · Exercise regularly.  · Low impact exercises such as walking, biking and swimming help to keep the muscles strong and keep normal joint function.  · Stretching helps to keep your range of motion.  · Lose weight if you are overweight. This reduces joint stress.  In severe cases when you have pain at rest or increasing disability, joint surgery may be helpful. See your caregiver for follow-up treatment as recommended.   SEEK IMMEDIATE MEDICAL CARE IF:   · You have severe joint pain.  · Marked swelling and redness in your joint develops.  · You develop a high fever.  Document Released: 04/05/2005 Document Revised: 03/25/2011 Document Reviewed: 09/05/2006  [employer]® Patient Information ©2012 [employer].

## 2011-11-17 ENCOUNTER — Ambulatory Visit: Payer: PRIVATE HEALTH INSURANCE | Admitting: Internal Medicine

## 2011-11-17 NOTE — Progress Notes (Signed)
  Subjective:    Patient ID: Erik Shaffer, male    DOB: December 01, 1965, 46 y.o.   MRN: [id number]  Arthritis Presents for follow-up visit. He complains of pain and stiffness. He reports no joint swelling or joint warmth. The symptoms have been stable. Affected locations include the left knee and right knee. His pain is at a severity of 6/10. Pertinent negatives include no diarrhea, dry eyes, dry mouth, dysuria, fatigue, fever, pain at night, pain while resting, rash, Raynaud's syndrome, uveitis or weight loss. Compliance with total regimen is 76-100%.      Review of Systems  Constitutional: Negative for fever, chills, weight loss, diaphoresis, activity change, appetite change, fatigue and unexpected weight change.  HENT: Positive for neck pain (chronic, unchanged). Negative for neck stiffness.   Eyes: Negative.   Respiratory: Negative.   Cardiovascular: Negative.   Gastrointestinal: Negative.  Negative for diarrhea.  Genitourinary: Negative.  Negative for dysuria.  Musculoskeletal: Positive for back pain (chronic, unchanged), arthralgias, arthritis and stiffness. Negative for myalgias, joint swelling and gait problem.  Skin: Negative.  Negative for rash.  Neurological: Negative.   Hematological: Negative for adenopathy. Does not bruise/bleed easily.  Psychiatric/Behavioral: Negative for suicidal ideas, behavioral problems, confusion, dysphoric mood, decreased concentration and agitation. The patient is not nervous/anxious.        Objective:   Physical Exam  Vitals reviewed. Constitutional: He is oriented to person, place, and time. He appears well-developed and well-nourished. No distress.  HENT:  Head: Normocephalic and atraumatic.  Mouth/Throat: Oropharynx is clear and moist. No oropharyngeal exudate.  Eyes: Conjunctivae are normal. Right eye exhibits no discharge. Left eye exhibits no discharge. No scleral icterus.  Neck: Normal range of motion. Neck supple. No JVD present. No  tracheal deviation present. No thyromegaly present.  Cardiovascular: Normal rate, regular rhythm, normal heart sounds and intact distal pulses.  Exam reveals no gallop and no friction rub.   No murmur heard. Pulmonary/Chest: Effort normal and breath sounds normal. No stridor. No respiratory distress. He has no wheezes. He has no rales. He exhibits no tenderness.  Abdominal: Soft. Bowel sounds are normal. He exhibits no distension and no mass. There is no tenderness. There is no rebound and no guarding.  Musculoskeletal: Normal range of motion. He exhibits no edema and no tenderness.       Right knee: He exhibits deformity (DJD changes). He exhibits normal range of motion, no swelling, no effusion, no ecchymosis, no laceration, no erythema, normal alignment, no LCL laxity, normal patellar mobility and no bony tenderness. no tenderness found.       Left knee: He exhibits deformity (DJD changes). He exhibits no swelling, no effusion, no ecchymosis, no laceration, normal alignment, no LCL laxity and normal patellar mobility. no tenderness found.  Lymphadenopathy:    He has no cervical adenopathy.  Neurological: He is alert and oriented to person, place, and time.  Skin: Skin is warm and dry. No rash noted. He is not diaphoretic. No erythema. No pallor.  Psychiatric: He has a normal mood and affect. His behavior is normal. Judgment and thought content normal.          Assessment & Plan:

## 2011-11-17 NOTE — Assessment & Plan Note (Signed)
He will continue the current meds with no changes

## 2011-11-26 ENCOUNTER — Encounter: Payer: Self-pay | Admitting: Internal Medicine

## 2011-11-26 ENCOUNTER — Ambulatory Visit (INDEPENDENT_AMBULATORY_CARE_PROVIDER_SITE_OTHER): Payer: PRIVATE HEALTH INSURANCE | Admitting: Internal Medicine

## 2011-11-26 ENCOUNTER — Ambulatory Visit: Payer: PRIVATE HEALTH INSURANCE | Admitting: Internal Medicine

## 2011-11-26 VITALS — BP 118/78 | HR 67 | Temp 97.9°F | Resp 16 | Wt 186.2 lb

## 2011-11-26 DIAGNOSIS — L0291 Cutaneous abscess, unspecified: Secondary | ICD-10-CM

## 2011-11-26 DIAGNOSIS — Z0289 Encounter for other administrative examinations: Secondary | ICD-10-CM

## 2011-11-26 DIAGNOSIS — M1710 Unilateral primary osteoarthritis, unspecified knee: Secondary | ICD-10-CM

## 2011-11-26 DIAGNOSIS — L0390 Cellulitis, unspecified: Secondary | ICD-10-CM | POA: Insufficient documentation

## 2011-11-26 MED ORDER — HYDROCODONE-IBUPROFEN 10-200 MG PO TABS: 1.0000 | ORAL_TABLET | Freq: Three times a day (TID) | ORAL | Status: DC | PRN

## 2011-11-26 MED ORDER — BUPRENORPHINE 20 MCG/HR TD PTWK: 20.0000 ug | MEDICATED_PATCH | TRANSDERMAL | Status: DC

## 2011-11-26 MED ORDER — SULFAMETHOXAZOLE-TRIMETHOPRIM 800-160 MG PO TABS: 1.0000 | ORAL_TABLET | Freq: Two times a day (BID) | ORAL | Status: AC

## 2011-11-26 NOTE — Patient Instructions (Signed)

## 2011-11-28 ENCOUNTER — Encounter: Payer: Self-pay | Admitting: Internal Medicine

## 2011-11-28 NOTE — Progress Notes (Signed)
  Subjective:    Patient ID: Erik Shaffer, male    DOB: 06-09-1965, 46 y.o.   MRN: [id number]  HPI He returns for f/up and tells me that he is taking percocet up to 5-6 times per day, he is concerned about the amount of tylenol and therefore he wants to change to a pain patch for more continuous relief of his pain. He is out of percocet today and needs something to control his pain while the patch takes effect.  Also, he has several red bumps on his torso that are draining pus.   Review of Systems  Constitutional: Negative for fever, chills, diaphoresis, activity change, appetite change, fatigue and unexpected weight change.  HENT: Positive for neck pain (chronic, unchnaged).   Eyes: Negative.   Respiratory: Negative for cough, chest tightness, shortness of breath, wheezing and stridor.   Cardiovascular: Negative.   Gastrointestinal: Negative.   Genitourinary: Negative.   Musculoskeletal: Positive for arthralgias. Negative for myalgias, back pain, joint swelling and gait problem.  Skin: Positive for rash. Negative for color change, pallor and wound.  Neurological: Negative.   Hematological: Negative.   Psychiatric/Behavioral: Negative.        Objective:   Physical Exam  Vitals reviewed. Constitutional: He is oriented to person, place, and time. He appears well-developed and well-nourished. No distress.  HENT:  Head: Normocephalic and atraumatic.  Mouth/Throat: Oropharynx is clear and moist. No oropharyngeal exudate.  Eyes: Conjunctivae are normal. Right eye exhibits no discharge. Left eye exhibits no discharge. No scleral icterus.  Neck: Normal range of motion. Neck supple. No JVD present. No tracheal deviation present. No thyromegaly present.  Cardiovascular: Normal rate, regular rhythm, normal heart sounds and intact distal pulses.  Exam reveals no gallop and no friction rub.   No murmur heard. Pulmonary/Chest: Effort normal and breath sounds normal. No stridor. No respiratory  distress. He has no wheezes. He has no rales. He exhibits no tenderness.  Abdominal: Soft. Bowel sounds are normal. He exhibits no distension. There is no tenderness. There is no rebound.  Musculoskeletal: Normal range of motion. He exhibits no edema and no tenderness.  Lymphadenopathy:    He has no cervical adenopathy.  Neurological: He is oriented to person, place, and time.  Skin: Skin is warm, dry and intact. Rash noted. No abrasion, no ecchymosis, no petechiae and no purpura noted. Rash is papular and pustular. Rash is not macular, not nodular, not vesicular and not urticarial. He is not diaphoretic. There is erythema. No cyanosis. No pallor.          Over his torso there are several small erythematous pustules wit mild warmth, there is no induration or fluctuance  Psychiatric: He has a normal mood and affect. His behavior is normal. Judgment and thought content normal.          Assessment & Plan:

## 2011-11-28 NOTE — Assessment & Plan Note (Signed)
He will stop taking percocet, he will use reprexain as a bridge while he is transitioning to butrans for pain relief

## 2011-11-28 NOTE — Assessment & Plan Note (Signed)
I think this is a MRSA infection so I have asked him to start Bactrim-DS

## 2012-01-05 ENCOUNTER — Telehealth: Payer: Self-pay | Admitting: Internal Medicine

## 2012-01-05 DIAGNOSIS — M1710 Unilateral primary osteoarthritis, unspecified knee: Secondary | ICD-10-CM

## 2012-01-05 NOTE — Telephone Encounter (Signed)
Request refill on Hydrocodone-Ibuprofen 10-200 MG TABS

## 2012-01-06 MED ORDER — HYDROCODONE-IBUPROFEN 10-200 MG PO TABS: 1.0000 | ORAL_TABLET | Freq: Three times a day (TID) | ORAL | Status: DC | PRN

## 2012-01-06 NOTE — Telephone Encounter (Signed)
Ok for refill? 

## 2012-01-20 ENCOUNTER — Ambulatory Visit (INDEPENDENT_AMBULATORY_CARE_PROVIDER_SITE_OTHER): Payer: PRIVATE HEALTH INSURANCE | Admitting: Internal Medicine

## 2012-01-20 ENCOUNTER — Encounter: Payer: Self-pay | Admitting: Internal Medicine

## 2012-01-20 VITALS — BP 110/68 | HR 67 | Temp 97.7°F | Resp 16 | Wt 187.0 lb

## 2012-01-20 DIAGNOSIS — M898X1 Other specified disorders of bone, shoulder: Secondary | ICD-10-CM

## 2012-01-20 DIAGNOSIS — M1710 Unilateral primary osteoarthritis, unspecified knee: Secondary | ICD-10-CM

## 2012-01-20 DIAGNOSIS — N529 Male erectile dysfunction, unspecified: Secondary | ICD-10-CM

## 2012-01-20 MED ORDER — OXYCODONE-ACETAMINOPHEN 7.5-325 MG PO TABS: 1.0000 | ORAL_TABLET | Freq: Four times a day (QID) | ORAL | Status: DC | PRN

## 2012-01-20 MED ORDER — TADALAFIL 20 MG PO TABS: 20.0000 mg | ORAL_TABLET | Freq: Every day | ORAL | Status: DC | PRN

## 2012-01-20 NOTE — Patient Instructions (Signed)

## 2012-01-21 NOTE — Assessment & Plan Note (Signed)
He will try cialis to help with this

## 2012-01-21 NOTE — Progress Notes (Signed)
Subjective:    Patient ID: Erik Shaffer, male    DOB: Sep 13, 1965, 46 y.o.   MRN: [id number]  Erectile Dysfunction This is a new problem. The current episode started more than 1 month ago. The problem is unchanged. The nature of his difficulty is maintaining erection and penetration. He reports no anxiety, decreased libido or performance anxiety. He reports his erection duration to be less than 1 minute. Irritative symptoms do not include frequency, nocturia or urgency. Obstructive symptoms do not include dribbling, incomplete emptying, an intermittent stream, a slower stream, straining or a weak stream. Pertinent negatives include no chills, dysuria, genital pain, hematuria, hesitancy or inability to urinate. The symptoms are aggravated by medications. Past treatments include nothing. The treatment provided no relief.  Arthritis Presents for follow-up visit. He complains of pain. He reports no stiffness, joint swelling or joint warmth. The symptoms have been stable. Affected locations include the left knee and right knee. His pain is at a severity of 3/10. Pertinent negatives include no dysuria, fatigue, fever or rash. Compliance with total regimen is 51-75%.      Review of Systems  Constitutional: Negative.  Negative for fever, chills and fatigue.  HENT: Negative.   Eyes: Negative.   Respiratory: Negative.   Cardiovascular: Negative.   Gastrointestinal: Negative.   Genitourinary: Negative.  Negative for dysuria, hesitancy, urgency, frequency, hematuria, decreased libido, incomplete emptying and nocturia.  Musculoskeletal: Positive for arthralgias (knees) and arthritis. Negative for myalgias, back pain, joint swelling, gait problem and stiffness.  Skin: Negative.  Negative for rash.  Neurological: Negative.   Hematological: Negative for adenopathy. Does not bruise/bleed easily.  Psychiatric/Behavioral: Negative.        Objective:   Physical Exam  Vitals reviewed. Constitutional: He is  oriented to person, place, and time. He appears well-developed and well-nourished. No distress.  HENT:  Head: Normocephalic and atraumatic.  Mouth/Throat: Oropharynx is clear and moist. No oropharyngeal exudate.  Eyes: Conjunctivae normal are normal. Right eye exhibits no discharge. Left eye exhibits no discharge. No scleral icterus.  Neck: Normal range of motion. Neck supple. No JVD present. No tracheal deviation present. No thyromegaly present.  Cardiovascular: Normal rate, regular rhythm, normal heart sounds and intact distal pulses.  Exam reveals no gallop and no friction rub.   No murmur heard. Pulmonary/Chest: Effort normal and breath sounds normal. No stridor. No respiratory distress. He has no wheezes. He has no rales. He exhibits no tenderness.  Abdominal: Soft. Bowel sounds are normal. He exhibits no distension and no mass. There is no tenderness. There is no rebound and no guarding.  Musculoskeletal: Normal range of motion. He exhibits no edema and no tenderness.  Lymphadenopathy:    He has no cervical adenopathy.  Neurological: He is oriented to person, place, and time.  Skin: Skin is warm and dry. No rash noted. He is not diaphoretic. No erythema. No pallor.  Psychiatric: He has a normal mood and affect. His behavior is normal. Judgment and thought content normal.     Lab Results  Component Value Date   WBC 10.6* 10/13/2011   HGB 14.4 10/13/2011   HCT 43.4 10/13/2011   PLT 404.0* 10/13/2011   GLUCOSE 91 10/13/2011   CHOL 244* 03/22/2011   TRIG 257.0* 03/22/2011   HDL 40.70 03/22/2011   LDLDIRECT 163.0 03/22/2011   ALT 15 10/13/2011   AST 23 10/13/2011   NA 142 10/13/2011   K 3.8 10/13/2011   CL 105 10/13/2011   CREATININE 0.4 10/13/2011  BUN 5* 10/13/2011   CO2 29 10/13/2011   TSH 0.36 10/13/2011   INR 1.06 06/19/2010       Assessment & Plan:

## 2012-01-21 NOTE — Assessment & Plan Note (Signed)
Continue percocet as needed for pain 

## 2012-02-08 ENCOUNTER — Telehealth: Payer: Self-pay | Admitting: Internal Medicine

## 2012-02-08 NOTE — Telephone Encounter (Signed)
no

## 2012-02-08 NOTE — Telephone Encounter (Signed)
Request to speak to Dr about Hydrocodone-Ibuprofen (REPREXAIN) 10-200 MG TABS, request refill only needs 30 tabs Needs refill done today  Insurance would only pay for 100 of the percocet

## 2012-02-10 NOTE — Telephone Encounter (Signed)
Denied, pt has appt 02/14/12

## 2012-02-14 ENCOUNTER — Encounter: Payer: Self-pay | Admitting: Internal Medicine

## 2012-02-14 ENCOUNTER — Ambulatory Visit (INDEPENDENT_AMBULATORY_CARE_PROVIDER_SITE_OTHER): Payer: PRIVATE HEALTH INSURANCE | Admitting: Internal Medicine

## 2012-02-14 VITALS — BP 118/64 | HR 80 | Temp 97.5°F | Resp 16 | Wt 193.0 lb

## 2012-02-14 DIAGNOSIS — M1710 Unilateral primary osteoarthritis, unspecified knee: Secondary | ICD-10-CM

## 2012-02-14 MED ORDER — IBUPROFEN 600 MG PO TABS: 600.0000 mg | ORAL_TABLET | Freq: Three times a day (TID) | ORAL | Status: DC | PRN

## 2012-02-14 MED ORDER — OXYCODONE-ACETAMINOPHEN 7.5-325 MG PO TABS: 1.0000 | ORAL_TABLET | Freq: Four times a day (QID) | ORAL | Status: AC | PRN

## 2012-02-14 NOTE — Patient Instructions (Signed)

## 2012-02-14 NOTE — Progress Notes (Signed)
Subjective:    Patient ID: Erik Shaffer, male    DOB: 06-09-1965, 46 y.o.   MRN: [id number]  Arthritis Presents for follow-up visit. He complains of pain and stiffness. He reports no joint swelling or joint warmth. The symptoms have been stable. Affected locations include the left knee and right knee. His pain is at a severity of 4/10. Associated symptoms include pain at night. Pertinent negatives include no diarrhea, dry eyes, dry mouth, dysuria, fatigue, fever, pain while resting, rash, Raynaud's syndrome, uveitis or weight loss. Compliance with total regimen is 76-100%. Compliance with medications is 76-100%.      Review of Systems  Constitutional: Negative for fever, chills, weight loss, diaphoresis, activity change, appetite change, fatigue and unexpected weight change.  HENT: Negative.   Eyes: Negative.   Respiratory: Negative for cough, chest tightness, shortness of breath, wheezing and stridor.   Cardiovascular: Negative for chest pain and leg swelling.  Gastrointestinal: Negative for nausea, vomiting, abdominal pain, diarrhea and blood in stool.  Genitourinary: Negative.  Negative for dysuria.  Musculoskeletal: Positive for back pain, arthralgias, arthritis and stiffness. Negative for myalgias, joint swelling and gait problem.  Skin: Negative for rash.  Neurological: Negative.   Hematological: Negative for adenopathy. Does not bruise/bleed easily.  Psychiatric/Behavioral: Negative for suicidal ideas, hallucinations, behavioral problems, confusion, disturbed wake/sleep cycle, self-injury, dysphoric mood, decreased concentration and agitation. The patient is not nervous/anxious and is not hyperactive.        Objective:   Physical Exam  Vitals reviewed. Constitutional: He is oriented to person, place, and time. He appears well-developed and well-nourished. No distress.  HENT:  Head: Normocephalic and atraumatic.  Mouth/Throat: Oropharynx is clear and moist. No oropharyngeal  exudate.  Eyes: Conjunctivae normal are normal. Right eye exhibits no discharge. Left eye exhibits no discharge. No scleral icterus.  Neck: Normal range of motion. Neck supple. No JVD present. No tracheal deviation present. No thyromegaly present.  Cardiovascular: Normal rate, regular rhythm, normal heart sounds and intact distal pulses.  Exam reveals no gallop and no friction rub.   No murmur heard. Pulmonary/Chest: Effort normal and breath sounds normal. No stridor. No respiratory distress. He has no wheezes. He has no rales. He exhibits no tenderness.  Abdominal: Soft. Bowel sounds are normal. He exhibits no distension. There is no tenderness. There is no rebound and no guarding.  Musculoskeletal: Normal range of motion. He exhibits no edema and no tenderness.       Right knee: He exhibits deformity (djd changes). He exhibits normal range of motion, no swelling, no effusion, no ecchymosis, no laceration, no erythema, normal alignment, no LCL laxity, normal patellar mobility and no bony tenderness. no tenderness found.       Left knee: He exhibits deformity (djd changes). He exhibits normal range of motion, no swelling, no effusion, no ecchymosis, no laceration, no erythema, normal alignment, no LCL laxity and normal patellar mobility. no tenderness found.  Lymphadenopathy:    He has no cervical adenopathy.  Neurological: He is alert and oriented to person, place, and time. He has normal reflexes. He displays normal reflexes. No cranial nerve deficit. He exhibits normal muscle tone. Coordination normal.  Skin: Skin is warm and dry. No rash noted. He is not diaphoretic. No erythema. No pallor.  Psychiatric: He has a normal mood and affect. His behavior is normal. Judgment and thought content normal.     Lab Results  Component Value Date   WBC 10.6* 10/13/2011   HGB 14.4 10/13/2011  HCT 43.4 10/13/2011   PLT 404.0* 10/13/2011   GLUCOSE 91 10/13/2011   CHOL 244* 03/22/2011   TRIG 257.0* 03/22/2011    HDL 40.70 03/22/2011   LDLDIRECT 163.0 03/22/2011   ALT 15 10/13/2011   AST 23 10/13/2011   NA 142 10/13/2011   K 3.8 10/13/2011   CL 105 10/13/2011   CREATININE 0.4 10/13/2011   BUN 5* 10/13/2011   CO2 29 10/13/2011   TSH 0.36 10/13/2011   INR 1.06 06/19/2010       Assessment & Plan:

## 2012-02-14 NOTE — Assessment & Plan Note (Signed)
He is taking more percocet than his insurance will cover on a per month basis, I gave him an Rx for percocet to cover him for now since he ran out about 3 days ago, he will continue ibuprofen, he will not use a patch for pain relief b/c butrans caused fatigue, he will not consider taking oxycontin b/c "it does not work quickly enough", I have asked him to see pain management

## 2012-02-22 ENCOUNTER — Encounter: Payer: Self-pay | Admitting: Physical Medicine & Rehabilitation

## 2012-03-01 ENCOUNTER — Other Ambulatory Visit: Payer: Self-pay | Admitting: Internal Medicine

## 2012-03-01 NOTE — Telephone Encounter (Signed)
Medication refill request   Please advise

## 2012-03-07 ENCOUNTER — Telehealth: Payer: Self-pay | Admitting: Physical Medicine & Rehabilitation

## 2012-03-07 ENCOUNTER — Encounter: Payer: PRIVATE HEALTH INSURANCE | Attending: Physical Medicine & Rehabilitation

## 2012-03-07 ENCOUNTER — Encounter: Payer: Self-pay | Admitting: Physical Medicine & Rehabilitation

## 2012-03-07 ENCOUNTER — Ambulatory Visit (HOSPITAL_BASED_OUTPATIENT_CLINIC_OR_DEPARTMENT_OTHER): Payer: PRIVATE HEALTH INSURANCE | Admitting: Physical Medicine & Rehabilitation

## 2012-03-07 VITALS — BP 149/83 | HR 89 | Resp 14 | Ht 70.0 in | Wt 192.0 lb

## 2012-03-07 DIAGNOSIS — Z5181 Encounter for therapeutic drug level monitoring: Secondary | ICD-10-CM

## 2012-03-07 NOTE — Progress Notes (Signed)
  Subjective:    Patient ID: Erik Shaffer, male    DOB: Apr 27, 1965, 46 y.o.   MRN: [id number]  HPI  Pain Inventory Average Pain 6 Pain Right Now 7 My pain is sharp stabbing aching constant  In the last 24 hours, has pain interfered with the following? General activity 8 Relation with others 7 Enjoyment of life 10 What TIME of day is your pain at its worst? morning, day, evening Sleep (in general) Good  Pain is worse with: bending, inactivity and some activites Pain improves with: medication Relief from Meds: 10  Mobility walk without assistance how many minutes can you walk? 60 ability to climb steps?  yes do you drive?  yes  Function employed # of hrs/week 40+  Neuro/Psych No problems in this area  Prior Studies Any changes since last visit?  no  Physicians involved in your care Dr Sanda Linger   Family History  Problem Relation Age of Onset  . Arthritis Other   . COPD Other     Lung cancer  . Hyperlipidemia Other   . Diabetes Other   . Alcohol abuse Mother   . Cancer Father    History   Social History  . Marital Status: Divorced    Spouse Name: N/A    Number of Children: N/A  . Years of Education: N/A   Occupational History  . maintenance    Social History Main Topics  . Smoking status: Current Every Day Smoker -- 0.2 packs/day for 30 years    Types: Cigarettes  . Smokeless tobacco: None  . Alcohol Use: No  . Drug Use: No  . Sexually Active: Yes   Other Topics Concern  . None   Social History Narrative   Caffienated drinks-yesSeat belt use often-yesRegular Exercise-noSmoke alarm in the home-yesFirearms/guns in the home-yesHistory of physical abuse-noHLE-11th grade   Past Surgical History  Procedure Date  . Microdiscectomy lumbar     L5-S1  . Hemilaminotomy lumbar spine 06/24/2010  . Spine surgery    Past Medical History  Diagnosis Date  . Arthritis   . Kidney stones   . Ulcer   . Depression    BP 149/83  Pulse 89  Resp 14  Ht  5\' 10"  (1.778 m)  Wt 192 lb (87.091 kg)  BMI 27.55 kg/m2  SpO2 99%     Review of Systems  Musculoskeletal: Positive for back pain.  All other systems reviewed and are negative.       Objective:   Physical Exam        Assessment & Plan:

## 2012-03-07 NOTE — Telephone Encounter (Signed)
Wife called wanting an explanation why the doctor would not come in to talk to patient.  I then spoke with Carollee Herter and she told me to tell the wife that we did not have anything here to offer patient.  I also told her that we gave him a pamphlet to the [facility] and he threw it in the trash.  I told her I was sorry and that was all the information that was given to me.  She is very upset and she said she will be reporting the doctor.

## 2012-03-14 ENCOUNTER — Ambulatory Visit: Payer: PRIVATE HEALTH INSURANCE | Admitting: Internal Medicine

## 2012-06-13 ENCOUNTER — Telehealth: Payer: Self-pay | Admitting: Internal Medicine

## 2012-06-13 NOTE — Telephone Encounter (Signed)
Left message for patient to call back and schedule.

## 2012-06-13 NOTE — Telephone Encounter (Signed)
Caller: Deanne Facility: not collected Patient: Erik Shaffer, Erik Shaffer DOB: Jun 12, 1965 Phone: [phone] Reason for Call: Wife Deanne calling, "he was done really dirty by the pain management MD" that he was sent to. Wanting a referral to another pain management specialist in [place] and will Dr. Yetta Barre write him a script for Norco 5/325. She had some at home and gave him what she had left. He had been on Percocet prior to that, written by a Dr. Patrick North that she used to work with for a 2 week amount. [facility] wrote him a script on 2/11 for Percocet for #15 . His antidepressant was stopped also. She would like for him to be back on that medication. She and Trey Paula would like Dr. Yetta Barre to manage his pain control medications or at least see if and give him medication until they can find a clinic that will accept their insurance [employer]. Her cousin committed suicide this morning due to the pain associated with the same surgery that Trey Paula had.

## 2012-06-13 NOTE — Telephone Encounter (Signed)
He needs to be seen

## 2012-06-14 ENCOUNTER — Ambulatory Visit (INDEPENDENT_AMBULATORY_CARE_PROVIDER_SITE_OTHER): Payer: PRIVATE HEALTH INSURANCE | Admitting: Internal Medicine

## 2012-06-14 VITALS — BP 120/54 | HR 78 | Temp 98.1°F | Resp 16 | Wt 188.0 lb

## 2012-06-14 DIAGNOSIS — Q761 Klippel-Feil syndrome: Secondary | ICD-10-CM

## 2012-06-14 DIAGNOSIS — F329 Major depressive disorder, single episode, unspecified: Secondary | ICD-10-CM

## 2012-06-14 DIAGNOSIS — M5412 Radiculopathy, cervical region: Secondary | ICD-10-CM

## 2012-06-14 DIAGNOSIS — M1710 Unilateral primary osteoarthritis, unspecified knee: Secondary | ICD-10-CM

## 2012-06-14 MED ORDER — HYDROCODONE-ACETAMINOPHEN 10-325 MG PO TABS: 1.0000 | ORAL_TABLET | Freq: Four times a day (QID) | ORAL | Status: DC | PRN

## 2012-06-14 NOTE — Patient Instructions (Signed)

## 2012-06-15 ENCOUNTER — Encounter: Payer: Self-pay | Admitting: Internal Medicine

## 2012-06-15 ENCOUNTER — Telehealth: Payer: Self-pay | Admitting: Internal Medicine

## 2012-06-15 NOTE — Telephone Encounter (Signed)
[facility] Triage Call Report Triage Record Num: [id number] Operator: Patriciaann Clan Patient Name: Brandonn Capelli Call Date & Time: 06/14/2012 5:20:19PM Patient Phone: [phone] PCP: Sanda Linger Patient Gender: Male PCP Fax : Patient DOB: 1965/09/22 [facility] Name[facility] Reason for Call: Caller: Deana/Spouse; PCP: Sanda Linger (Adults only); CB#: [phone]; Call regarding Medication Inquiry; Spouse states patient was seen in office 06/14/12 for generalized neck, knee and back pain. States patient was prescribed Norco for pain. Spouse states she forgot to ask Dr. Yetta Barre for a Rx for Lyrica. Spouse states she is requesting a Rx for Lyrica " in hopes that he will not have to take as much Norco." Patient uses [facility] in [place] at [phone]. Spouse can be reached at [phone]. Spouse advised to call office 06/15/12 for medication request. Spouse advised to return call if pain is not relieved by Norco 06/14/12. Spouse verbalizes understanding and agreeable. Protocol(s) Used: Office Note Recommended Outcome per Protocol: Information Noted and Sent to Office Reason for Outcome: Caller information to office

## 2012-06-15 NOTE — Progress Notes (Signed)
Subjective:    Patient ID: Erik Shaffer, male    DOB: 07/31/1965, 47 y.o.   MRN: [id number]  Neck Pain  This is a chronic problem. The current episode started more than 1 year ago. The problem occurs intermittently. The problem has been unchanged. The pain is associated with nothing. The pain is present in the midline. The quality of the pain is described as aching and burning. The pain is at a severity of 3/10. The pain is moderate. Nothing aggravates the symptoms. The pain is worse during the day. Stiffness is present in the morning. Pertinent negatives include no chest pain, fever, headaches, leg pain, numbness, pain with swallowing, paresis, photophobia, syncope, tingling, trouble swallowing, visual change, weakness or weight loss. He has tried NSAIDs, oral narcotics and acetaminophen for the symptoms. The treatment provided moderate relief.      Review of Systems  Constitutional: Negative for fever, chills, weight loss, diaphoresis, activity change, appetite change, fatigue and unexpected weight change.  HENT: Positive for neck pain. Negative for trouble swallowing and neck stiffness.   Eyes: Negative.  Negative for photophobia.  Respiratory: Negative.  Negative for cough, chest tightness, shortness of breath, wheezing and stridor.   Cardiovascular: Negative.  Negative for chest pain, leg swelling and syncope.  Gastrointestinal: Negative for nausea, vomiting, abdominal pain, diarrhea, constipation and blood in stool.  Endocrine: Negative.   Genitourinary: Negative.   Musculoskeletal: Positive for arthralgias (knees, elbows, shoulders). Negative for myalgias, back pain, joint swelling and gait problem.  Skin: Negative for color change, pallor and rash.  Allergic/Immunologic: Negative.   Neurological: Negative.  Negative for tingling, weakness, numbness and headaches.  Hematological: Negative.  Negative for adenopathy. Does not bruise/bleed easily.  Psychiatric/Behavioral: Negative for  suicidal ideas, hallucinations, behavioral problems, confusion, sleep disturbance, self-injury, dysphoric mood, decreased concentration and agitation. The patient is nervous/anxious. The patient is not hyperactive.        Objective:   Physical Exam  Vitals reviewed. Constitutional: He is oriented to person, place, and time. He appears well-developed and well-nourished. No distress.  HENT:  Head: Normocephalic and atraumatic.  Mouth/Throat: Oropharynx is clear and moist. No oropharyngeal exudate.  Eyes: Conjunctivae are normal. Right eye exhibits no discharge. Left eye exhibits no discharge. No scleral icterus.  Neck: Normal range of motion. Neck supple. No JVD present. No tracheal deviation present. No thyromegaly present.  Cardiovascular: Normal rate, regular rhythm, normal heart sounds and intact distal pulses.  Exam reveals no gallop and no friction rub.   No murmur heard. Pulmonary/Chest: Effort normal and breath sounds normal. No stridor. No respiratory distress. He has no wheezes. He has no rales. He exhibits no tenderness.  Abdominal: Soft. Bowel sounds are normal. He exhibits no distension and no mass. There is no tenderness. There is no rebound and no guarding.  Musculoskeletal: Normal range of motion. He exhibits no edema and no tenderness.       Cervical back: Normal. He exhibits normal range of motion, no tenderness, no bony tenderness, no swelling, no edema, no deformity, no laceration, no pain, no spasm and normal pulse.  Lymphadenopathy:    He has no cervical adenopathy.  Neurological: He is alert and oriented to person, place, and time. He has normal strength. He displays no atrophy, no tremor and normal reflexes. No cranial nerve deficit or sensory deficit. He exhibits normal muscle tone. He displays a negative Romberg sign. He displays no seizure activity. Coordination and gait normal.  Reflex Scores:  Tricep reflexes are 1+ on the right side and 1+ on the left side.       Bicep reflexes are 1+ on the right side and 1+ on the left side.      Brachioradialis reflexes are 1+ on the right side and 1+ on the left side.      Patellar reflexes are 1+ on the right side and 1+ on the left side.      Achilles reflexes are 1+ on the right side and 1+ on the left side. Skin: Skin is warm and dry. No rash noted. He is not diaphoretic. No erythema. No pallor.  Psychiatric: He has a normal mood and affect. His behavior is normal. Judgment and thought content normal.          Assessment & Plan:

## 2012-06-15 NOTE — Telephone Encounter (Signed)
Returned call to patient, line busy, will try again later  

## 2012-06-15 NOTE — Assessment & Plan Note (Signed)
He will control the pain with norco and motrin

## 2012-06-15 NOTE — Assessment & Plan Note (Signed)
He has persistent but stable pain. He tells me that he saw Dr. Wynn Banker and was told that nothing can be done for him. He has seen his neurosurgeon about this as well. He will continue to control the pain with norco ad motrin.

## 2012-06-15 NOTE — Telephone Encounter (Signed)
I don't think lyrica will help his pain

## 2012-06-15 NOTE — Assessment & Plan Note (Signed)
He will f/up with Dr. Evelene Croon

## 2012-06-16 NOTE — Telephone Encounter (Signed)
Returned call to patient, line busy, will try again later

## 2012-06-19 NOTE — Telephone Encounter (Signed)
Closing phone note, no response from pt

## 2012-11-15 ENCOUNTER — Other Ambulatory Visit: Payer: Self-pay | Admitting: Internal Medicine

## 2012-11-16 ENCOUNTER — Telehealth: Payer: Self-pay | Admitting: Internal Medicine

## 2012-11-16 DIAGNOSIS — M5412 Radiculopathy, cervical region: Secondary | ICD-10-CM

## 2012-11-16 DIAGNOSIS — M1710 Unilateral primary osteoarthritis, unspecified knee: Secondary | ICD-10-CM

## 2012-11-16 DIAGNOSIS — M179 Osteoarthritis of knee, unspecified: Secondary | ICD-10-CM

## 2012-11-16 DIAGNOSIS — Q761 Klippel-Feil syndrome: Secondary | ICD-10-CM

## 2012-11-16 NOTE — Telephone Encounter (Signed)
Pt is on the way out of town.  He made an appt for Tues morning.  He is requesting enough generic  Norco to be called in to [facility] in [place] on [facility].  The will have it transferred to the one in [place].

## 2012-11-16 NOTE — Telephone Encounter (Signed)
ok 

## 2012-11-17 ENCOUNTER — Telehealth: Payer: Self-pay | Admitting: *Deleted

## 2012-11-17 MED ORDER — HYDROCODONE-ACETAMINOPHEN 10-325 MG PO TABS: 1.0000 | ORAL_TABLET | Freq: Four times a day (QID) | ORAL | Status: DC | PRN

## 2012-11-17 NOTE — Telephone Encounter (Signed)
Rx called in to pharmacy as requested by pt. Returned call to pt at number provided, per voice prompt, phone D/c

## 2012-11-17 NOTE — Telephone Encounter (Signed)
[facility] pharmacist called states pt just received Oxycodone 5/325 #120 on 7.18.14 written Azucena Fallen, MD from [facility].  And Norco 10/325mg  #100 was written today by you.  Please advise

## 2012-11-19 ENCOUNTER — Encounter: Payer: Self-pay | Admitting: Internal Medicine

## 2012-11-19 NOTE — Telephone Encounter (Signed)
Please cancel my script

## 2012-11-20 ENCOUNTER — Telehealth: Payer: Self-pay | Admitting: *Deleted

## 2012-11-20 NOTE — Telephone Encounter (Signed)
[facility] Triage Call Report Triage Record Num: [id number] Operator: Frederico Hamman Patient Name: Erik Shaffer Call Date & Time: 11/17/2012 8:22:49PM Patient Phone: [phone] PCP: Sanda Linger Patient Gender: Male PCP Fax : Patient DOB: 12-11-1965 [facility] Name[facility] Reason for Call: [name]/ Wife states [name] was supposed to have prescription for Norco 10/325 mg #100 called in to [facility] in [place] on 7/31. Per Epic- note from [facility] pharmacy stated prescription for Oxycodone 5/325mg  #120 was written by Dr. Azucena Fallen at [facility] for pain due to Rheumatoid Arthritis on 11/03/12. Pharmacy questioning whether to fill prescription for Norco. Note left for Dr. Yetta Barre regarding Norco prescribed on 11/17/12. No instructions from Dr. Yetta Barre thus far. Wife calling requesting prescription for Norco be filled. Explained no controlled meds can be called in after hours and weekends. Wife states Oxycodone is too strong and is only for severe pain. Dr. Dayton Martes notified. Verbal order given to follow up with office on 11/20/12 regarding Norco prescription. Wife states [facility] pharmacy told her that [name] needed to schedule appointment at [facility] and that he has an appointment with Dr. Yetta Barre on 11/21/12. [name]/Wife states she has a family member that will lend [name] the [employer]. Will follow up with Dr. Yetta Barre on 11/21/12 when seen in office. Protocol(s) Used: Medication Questions - Adult Recommended Outcome per Protocol: Speak with Provider or Pharmacist within 24 hours Reason for Outcome: Requests refill of prescribed medication with valid refills; lack of medications does not put patient at clinical risk Care Advice: ~ 08/

## 2012-11-20 NOTE — Telephone Encounter (Signed)
done

## 2012-11-20 NOTE — Telephone Encounter (Signed)
Per Dr Laurin Coder Rx is to be cancelled.

## 2012-11-21 ENCOUNTER — Telehealth: Payer: Self-pay | Admitting: Internal Medicine

## 2012-11-21 ENCOUNTER — Ambulatory Visit: Payer: PRIVATE HEALTH INSURANCE | Admitting: Internal Medicine

## 2012-11-21 NOTE — Telephone Encounter (Addendum)
Patient dismissed from [facility] by Sanda Linger MD , effective November 19 2012. Dismissal letter sent out by certified / registered mail. DAJ  Received signed domestic return receipt verifying delivery of certified letter on November 23, 2012. Article number [id number] DAJ

## 2015-04-23 ENCOUNTER — Ambulatory Visit: Payer: BLUE CROSS/BLUE SHIELD | Attending: Rheumatology

## 2015-04-23 DIAGNOSIS — R293 Abnormal posture: Secondary | ICD-10-CM | POA: Diagnosis present

## 2015-04-23 DIAGNOSIS — M25532 Pain in left wrist: Secondary | ICD-10-CM | POA: Diagnosis present

## 2015-04-23 DIAGNOSIS — M545 Low back pain, unspecified: Secondary | ICD-10-CM

## 2015-04-23 DIAGNOSIS — M25512 Pain in left shoulder: Secondary | ICD-10-CM | POA: Insufficient documentation

## 2015-04-23 DIAGNOSIS — R29898 Other symptoms and signs involving the musculoskeletal system: Secondary | ICD-10-CM | POA: Diagnosis present

## 2015-04-23 DIAGNOSIS — M25562 Pain in left knee: Secondary | ICD-10-CM | POA: Diagnosis present

## 2015-04-23 DIAGNOSIS — R6889 Other general symptoms and signs: Secondary | ICD-10-CM | POA: Insufficient documentation

## 2015-04-23 DIAGNOSIS — M25561 Pain in right knee: Secondary | ICD-10-CM | POA: Diagnosis present

## 2015-04-23 DIAGNOSIS — M25511 Pain in right shoulder: Secondary | ICD-10-CM | POA: Diagnosis present

## 2015-04-23 DIAGNOSIS — G8929 Other chronic pain: Secondary | ICD-10-CM | POA: Diagnosis present

## 2015-04-23 DIAGNOSIS — M542 Cervicalgia: Secondary | ICD-10-CM | POA: Diagnosis present

## 2015-04-23 DIAGNOSIS — M25531 Pain in right wrist: Secondary | ICD-10-CM | POA: Diagnosis present

## 2015-04-23 NOTE — Therapy (Signed)
[place], [place], [address] Phone: [phone]   Fax:  [phone]  Physical Therapy Evaluation  Patient Details  Name: Erik Shaffer MRN: [id number] Date of Birth: [birth year] No Data Recorded  Encounter Date: 04/23/2015      PT End of Session - 04/23/15 1748    Visit Number 1   Number of Visits 1   Authorization Type [employer]   PT Start Time 0130   PT Stop Time 0510   PT Time Calculation (min) 220 min   Activity Tolerance Patient tolerated treatment well;Patient limited by pain   Behavior During Therapy [facility] for tasks assessed/performed      Past Medical History  Diagnosis Date  . Arthritis   . Kidney stones   . Ulcer   . Depression     Past Surgical History  Procedure Laterality Date  . Microdiscectomy lumbar      L5-S1  . Hemilaminotomy lumbar spine  06/24/2010  . Spine surgery      There were no vitals filed for this visit.  Visit Diagnosis:  Weakness of both lower extremities - Plan: PT plan of care cert/re-cert  Abnormal posture - Plan: PT plan of care cert/re-cert  Activity intolerance - Plan: PT plan of care cert/re-cert  Neck pain of over 3 months duration - Plan: PT plan of care cert/re-cert  Bilateral low back pain without sciatica - Plan: PT plan of care cert/re-cert  Arthralgia of both knees - Plan: PT plan of care cert/re-cert  Pain in both wrists - Plan: PT plan of care cert/re-cert  Pain of both shoulder joints - Plan: PT plan of care cert/re-cert      Subjective Assessment - 04/23/15 1747    Subjective See FCE scanned into EPIC   Currently in Pain? Yes  See FCE scanned in EPIC                               PT Education - 04/23/15 1748    Education provided Yes   Education Details Results of FCE   Person(s) Educated Patient   Methods Explanation   Comprehension Verbalized understanding                    Plan -  04/23/15 1749    Clinical Impression Statement See FCE report scanned into EPIC.  He was rated overall at a medium level for work   PT Next Visit Plan No followup   Consulted and Agree with Plan of Care Patient      RESULTS OF FCE TO BE FAXED TO DR SYED   Problem List Patient Active Problem List   Diagnosis Date Noted  . Cervical vertebral fusion syndrome 06/14/2012  . ED (erectile dysfunction) 01/20/2012  . DJD (degenerative joint disease) of knee 10/13/2011  . Routine general medical examination at a health care facility 03/22/2011  . Tobacco abuse 03/10/2011  . Depression 11/19/2010    Darrel Hoover PT  04/23/2015, 5:54 PM  [facility] [address] [place], [place], [address] Phone: [phone]   Fax:  [phone]  Name: Erik Shaffer MRN: [id number] Date of Birth: 1965/12/16

## 2016-03-01 ENCOUNTER — Encounter: Payer: Self-pay | Admitting: Family Medicine

## 2016-03-01 ENCOUNTER — Ambulatory Visit: Payer: Self-pay | Attending: Family Medicine | Admitting: Family Medicine

## 2016-03-01 VITALS — BP 139/82 | HR 87 | Temp 98.0°F | Ht 71.0 in | Wt 214.4 lb

## 2016-03-01 DIAGNOSIS — F909 Attention-deficit hyperactivity disorder, unspecified type: Secondary | ICD-10-CM | POA: Insufficient documentation

## 2016-03-01 DIAGNOSIS — E785 Hyperlipidemia, unspecified: Secondary | ICD-10-CM

## 2016-03-01 DIAGNOSIS — Z91018 Allergy to other foods: Secondary | ICD-10-CM | POA: Insufficient documentation

## 2016-03-01 DIAGNOSIS — M5020 Other cervical disc displacement, unspecified cervical region: Secondary | ICD-10-CM | POA: Insufficient documentation

## 2016-03-01 DIAGNOSIS — R21 Rash and other nonspecific skin eruption: Secondary | ICD-10-CM | POA: Insufficient documentation

## 2016-03-01 DIAGNOSIS — F39 Unspecified mood [affective] disorder: Secondary | ICD-10-CM | POA: Insufficient documentation

## 2016-03-01 DIAGNOSIS — Z981 Arthrodesis status: Secondary | ICD-10-CM | POA: Insufficient documentation

## 2016-03-01 DIAGNOSIS — M069 Rheumatoid arthritis, unspecified: Secondary | ICD-10-CM

## 2016-03-01 DIAGNOSIS — Z720 Tobacco use: Secondary | ICD-10-CM

## 2016-03-01 DIAGNOSIS — Z801 Family history of malignant neoplasm of trachea, bronchus and lung: Secondary | ICD-10-CM | POA: Insufficient documentation

## 2016-03-01 DIAGNOSIS — Z836 Family history of other diseases of the respiratory system: Secondary | ICD-10-CM | POA: Insufficient documentation

## 2016-03-01 DIAGNOSIS — Z87442 Personal history of urinary calculi: Secondary | ICD-10-CM | POA: Insufficient documentation

## 2016-03-01 DIAGNOSIS — Z8261 Family history of arthritis: Secondary | ICD-10-CM | POA: Insufficient documentation

## 2016-03-01 DIAGNOSIS — Z811 Family history of alcohol abuse and dependence: Secondary | ICD-10-CM | POA: Insufficient documentation

## 2016-03-01 DIAGNOSIS — L309 Dermatitis, unspecified: Secondary | ICD-10-CM

## 2016-03-01 DIAGNOSIS — M48061 Spinal stenosis, lumbar region without neurogenic claudication: Secondary | ICD-10-CM | POA: Insufficient documentation

## 2016-03-01 DIAGNOSIS — Z809 Family history of malignant neoplasm, unspecified: Secondary | ICD-10-CM | POA: Insufficient documentation

## 2016-03-01 DIAGNOSIS — F17210 Nicotine dependence, cigarettes, uncomplicated: Secondary | ICD-10-CM | POA: Insufficient documentation

## 2016-03-01 DIAGNOSIS — Z114 Encounter for screening for human immunodeficiency virus [HIV]: Secondary | ICD-10-CM

## 2016-03-01 LAB — CBC
HCT: 41.7 % (ref 38.5–50.0)
HEMOGLOBIN: 14.1 g/dL (ref 13.2–17.1)
MCH: 29.1 pg (ref 27.0–33.0)
MCHC: 33.8 g/dL (ref 32.0–36.0)
MCV: 86.2 fL (ref 80.0–100.0)
MPV: 9.3 fL (ref 7.5–12.5)
Platelets: 512 10*3/uL — ABNORMAL HIGH (ref 140–400)
RBC: 4.84 MIL/uL (ref 4.20–5.80)
RDW: 13.9 % (ref 11.0–15.0)
WBC: 9.6 10*3/uL (ref 3.8–10.8)

## 2016-03-01 LAB — LIPID PANEL
CHOL/HDL RATIO: 4.9 ratio (ref <5.0)
Cholesterol: 196 mg/dL (ref <200)
HDL: 40 mg/dL — ABNORMAL LOW (ref >40)
LDL Cholesterol: 130 mg/dL — ABNORMAL HIGH (ref <100)
Triglycerides: 131 mg/dL (ref <150)
VLDL: 26 mg/dL (ref <30)

## 2016-03-01 LAB — COMPLETE METABOLIC PANEL WITH GFR
ALBUMIN: 3.9 g/dL (ref 3.6–5.1)
ALK PHOS: 75 U/L (ref 40–115)
ALT: 13 U/L (ref 9–46)
AST: 19 U/L (ref 10–35)
BILIRUBIN TOTAL: 0.3 mg/dL (ref 0.2–1.2)
BUN: 13 mg/dL (ref 7–25)
CO2: 26 mmol/L (ref 20–31)
Calcium: 9.2 mg/dL (ref 8.6–10.3)
Chloride: 103 mmol/L (ref 98–110)
Creat: 0.82 mg/dL (ref 0.70–1.33)
Glucose, Bld: 95 mg/dL (ref 65–99)
Potassium: 4.6 mmol/L (ref 3.5–5.3)
Sodium: 138 mmol/L (ref 135–146)
TOTAL PROTEIN: 6.9 g/dL (ref 6.1–8.1)

## 2016-03-01 LAB — URIC ACID: Uric Acid, Serum: 3.8 mg/dL — ABNORMAL LOW (ref 4.0–8.0)

## 2016-03-01 MED ORDER — TRIAMCINOLONE ACETONIDE 0.1 % EX CREA: 1.0000 "application " | TOPICAL_CREAM | Freq: Two times a day (BID) | CUTANEOUS | 0 refills | Status: DC

## 2016-03-01 MED ORDER — PREDNISONE 20 MG PO TABS: 20.0000 mg | ORAL_TABLET | Freq: Every day | ORAL | 5 refills | Status: DC

## 2016-03-01 MED ORDER — OMEPRAZOLE 20 MG PO CPDR: 20.0000 mg | DELAYED_RELEASE_CAPSULE | Freq: Every day | ORAL | 5 refills | Status: DC

## 2016-03-01 MED FILL — predniSONE 20 MG TABS: 20 | 30 days supply | Qty: 30 | Fill #0

## 2016-03-01 MED FILL — ?OMEPRAZOLE DR 20 MG CAPSUL: 20 | 30 days supply | Qty: 30 | Fill #0

## 2016-03-01 MED FILL — TRIAMCINOLONE 0.1% CREAM: 0.1 | 10 days supply | Qty: 30 | Fill #0

## 2016-03-01 NOTE — Assessment & Plan Note (Signed)
Reports hx of ADHD on adderal and benzo  Plan: Psych referral placed

## 2016-03-01 NOTE — Assessment & Plan Note (Signed)
Reports hx of RA Severe neutropenia on methotrexate Has joint deformity in finger of R hand Plan: rheumatoid factor checked Rheumatology referral Refilled prednisone

## 2016-03-01 NOTE — Assessment & Plan Note (Signed)
No desire to quit at this time.

## 2016-03-01 NOTE — Progress Notes (Signed)
Pt is here to establish care. Pt is having trouble walking.  Pt pain level is 9 for knees, wrist, hands.  Pt declined flu shot.

## 2016-03-01 NOTE — Progress Notes (Signed)
LOGO@  Subjective:  Patient ID: Erik Shaffer, male    DOB: 1965/12/12  Age: 50 y.o. MRN: [id number]  CC: Establish Care   HPI Erik Shaffer is a smoker with hx of cervical disc protrusion causing spinal stenosis s/p cervical vertebral fusion,  lumbar herniated disk d/p microdiscectomy in lumbar spine and  knee arthritis he presents for   1. Reports history of rheumatoid arthritis: he was diagnosed in 77 in [place]. He was treated by Rheumatology by Dr. Amil Shaffer, then Dr. Titus Shaffer at [facility] on methotrexate but did not tolerate it at all and have severe leukopenia and nausea and vomiting.  He is now on prednisone 20 mg daily, but takes 10 mg daily most days due to desire to make Rx last.   2. Current smoker:  Smoking 1 PPD. Does not desire to quit.   3. Reports history of ADHD: diagnosed with ADHD last year. Treated with adderal and xanax by a psychiatrist. He request referral to psychiatry. Reports trouble focusing.   4. Tomato allergy: allergic to tomato. Still eats tomato. Gets intermittent rash on face. Request steroid cream.   Past Medical History:  Diagnosis Date  . Arthritis   . Depression   . Kidney stones   . Ulcer [facility])     Past Surgical History:  Procedure Laterality Date  . HEMILAMINOTOMY LUMBAR SPINE  06/24/2010  . MICRODISCECTOMY LUMBAR     L5-S1  . SPINE SURGERY      Family History  Problem Relation Age of Onset  . Arthritis Other   . COPD Other     Lung cancer  . Hyperlipidemia Other   . Diabetes Other   . Alcohol abuse Mother   . Cancer Father     Social History  Substance Use Topics  . Smoking status: Current Every Day Smoker    Packs/day: 0.25    Years: 30.00    Types: Cigarettes  . Smokeless tobacco: Never Used  . Alcohol use No    ROS Review of Systems  Constitutional: Negative for chills, fatigue, fever and unexpected weight change.  Eyes: Negative for visual disturbance.  Respiratory: Negative for cough and shortness of  breath.   Cardiovascular: Negative for chest pain, palpitations and leg swelling.  Gastrointestinal: Negative for abdominal pain, blood in stool, constipation, diarrhea, nausea and vomiting.  Endocrine: Negative for polydipsia, polyphagia and polyuria.  Musculoskeletal: Positive for arthralgias, back pain and gait problem. Negative for myalgias and neck pain.  Skin: Negative for rash.  Allergic/Immunologic: Negative for immunocompromised state.  Hematological: Negative for adenopathy. Does not bruise/bleed easily.  Psychiatric/Behavioral: Positive for dysphoric mood. Negative for sleep disturbance and suicidal ideas. The patient is not nervous/anxious.     Objective:   Today's Vitals: BP 139/82 (BP Location: Left Arm, Patient Position: Sitting, Cuff Size: Small)   Pulse 87   Temp 98 F (36.7 C) (Oral)   Ht 5\' 11"  (1.803 m)   Wt 214 lb 6.4 oz (97.3 kg)   SpO2 98%   BMI 29.90 kg/m   Physical Exam  Constitutional: He appears well-developed and well-nourished. No distress.  HENT:  Head: Normocephalic and atraumatic.  Neck: Normal range of motion. Neck supple.  Cardiovascular: Normal rate, regular rhythm, normal heart sounds and intact distal pulses.   Pulmonary/Chest: Effort normal and breath sounds normal.  Musculoskeletal: He exhibits edema and tenderness.       Right knee: He exhibits swelling and effusion. He exhibits normal range of motion.  Left knee: He exhibits swelling and effusion. He exhibits normal range of motion.       Right hand: He exhibits deformity and swelling.  Neurological: He is alert.  Skin: Skin is warm and dry. No rash noted. No erythema.  Psychiatric: He has a normal mood and affect.     Depression screen PHQ 2/9 03/01/2016  Decreased Interest 3  Down, Depressed, Hopeless 3  PHQ - 2 Score 6  Altered sleeping 1  Tired, decreased energy 3  Change in appetite 0  Feeling bad or failure about yourself  2  Trouble concentrating 0  Moving slowly or  fidgety/restless 0  Suicidal thoughts 1  PHQ-9 Score 13   GAD 7 : Generalized Anxiety Score 03/01/2016  Nervous, Anxious, on Edge 0  Control/stop worrying 0  Worry too much - different things 0  Trouble relaxing 3  Restless 3  Easily annoyed or irritable 1  Afraid - awful might happen 0  Total GAD 7 Score 7     Assessment & Plan:   Problem List Items Addressed This Visit      High   Rheumatoid arthritis (HCC) (Chronic)   Relevant Medications   predniSONE (DELTASONE) 20 MG tablet   omeprazole (PRILOSEC) 20 MG capsule   Other Relevant Orders   Uric Acid   ANA,IFA RA Diag Pnl w/rflx Tit/Patn   Ambulatory referral to Rheumatology   CBC   COMPLETE METABOLIC PANEL WITH GFR   Lipid Panel    Other Visit Diagnoses    Encounter for screening for HIV    -  Primary   Relevant Orders   HIV antibody (with reflex)   Dermatitis       Relevant Medications   triamcinolone cream (KENALOG) 0.1 %   Mood disorder ([place])       Relevant Orders   Ambulatory referral to Psychiatry      Outpatient Encounter Prescriptions as of 03/01/2016  Medication Sig  . HYDROcodone-acetaminophen (NORCO) 10-325 MG per tablet Take 1 tablet by mouth every 6 (six) hours as needed for pain. (Patient not taking: Reported on 04/23/2015)  . ibuprofen (ADVIL,MOTRIN) 600 MG tablet Take 1 tablet (600 mg total) by mouth every 8 (eight) hours as needed for pain. (Patient not taking: Reported on 04/23/2015)  . omeprazole (PRILOSEC) 20 MG capsule Reported on 04/23/2015  . predniSONE (DELTASONE) 10 MG tablet Take 10 mg by mouth daily with breakfast.   No facility-administered encounter medications on file as of 03/01/2016.     Follow-up: No Follow-up on file.    Boykin Nearing MD

## 2016-03-01 NOTE — Patient Instructions (Addendum)
Erik Shaffer was seen today for establish care.  Diagnoses and all orders for this visit:  Encounter for screening for HIV -     HIV antibody (with reflex)  Rheumatoid arthritis involving multiple sites, unspecified rheumatoid factor presence (HCC) -     Uric Acid -     ANA,IFA RA Diag Pnl w/rflx Tit/Patn -     Discontinue: predniSONE (DELTASONE) 20 MG tablet; Take 1 tablet (20 mg total) by mouth daily with breakfast. -     Ambulatory referral to Rheumatology -     predniSONE (DELTASONE) 20 MG tablet; Take 1 tablet (20 mg total) by mouth daily with breakfast. -     omeprazole (PRILOSEC) 20 MG capsule; Take 1 capsule (20 mg total) by mouth daily. Reported on 04/23/2015  Dermatitis -     Discontinue: triamcinolone cream (KENALOG) 0.1 %; Apply 1 application topically 2 (two) times daily. -     triamcinolone cream (KENALOG) 0.1 %; Apply 1 application topically 2 (two) times daily.  Mood disorder ([place]) -     Ambulatory referral to Psychiatry  I have sent your prescritions to [facility] and to onsite pharmacy We do have med assistance here which dose help lower cost of meds, some meds are even free. Check with the pharmacy.   F/u in 8 week for chronic pain, discuss non-narcotic treatment options   Dr. Adrian Blackwater

## 2016-03-02 DIAGNOSIS — E785 Hyperlipidemia, unspecified: Secondary | ICD-10-CM | POA: Insufficient documentation

## 2016-03-02 LAB — ANA,IFA RA DIAG PNL W/RFLX TIT/PATN
Anti Nuclear Antibody(ANA): NEGATIVE
Rhuematoid fact SerPl-aCnc: <14 IU/mL (ref <14)

## 2016-03-02 LAB — HIV ANTIBODY (ROUTINE TESTING W REFLEX): HIV: NONREACTIVE

## 2016-03-02 MED ORDER — ATORVASTATIN CALCIUM 40 MG PO TABS: 40.0000 mg | ORAL_TABLET | Freq: Every day | ORAL | 3 refills | Status: DC

## 2016-03-02 MED ORDER — ASPIRIN EC 81 MG PO TBEC: 81.0000 mg | DELAYED_RELEASE_TABLET | Freq: Every day | ORAL | 3 refills | Status: DC

## 2016-03-02 NOTE — Addendum Note (Signed)
Addended by: Boykin Nearing on: 03/02/2016 09:24 AM   Modules accepted: Orders

## 2016-03-02 NOTE — Assessment & Plan Note (Signed)
Cholesterol has improved LDL still elevated Since patient is a smoker Heart disease and stroke risk is high 10.7 % 10 yr CVD risk  Start lipitor 40 mg daily to reduce risk and cholesterol  Patient is also in low dose aspirin benefit group (NNT > NNH), recommend 81mg  daily ASA

## 2016-03-03 ENCOUNTER — Telehealth: Payer: Self-pay

## 2016-03-03 NOTE — Telephone Encounter (Signed)
Pt was called and a VM was left informing pt of his lab results and medication being sent to pharmacy.

## 2016-03-29 ENCOUNTER — Ambulatory Visit: Payer: Self-pay | Attending: Family Medicine

## 2016-04-20 ENCOUNTER — Ambulatory Visit: Payer: Self-pay

## 2016-04-20 MED FILL — predniSONE 20 MG TABS: 20 | 30 days supply | Qty: 30 | Fill #1

## 2016-04-30 ENCOUNTER — Ambulatory Visit: Payer: Self-pay

## 2016-05-03 ENCOUNTER — Ambulatory Visit: Payer: Self-pay | Attending: Family Medicine

## 2016-05-10 ENCOUNTER — Ambulatory Visit: Payer: Self-pay | Attending: Family Medicine | Admitting: Family Medicine

## 2016-05-10 ENCOUNTER — Encounter: Payer: Self-pay | Admitting: Family Medicine

## 2016-05-10 VITALS — BP 145/85 | HR 88 | Temp 97.7°F | Ht 71.0 in | Wt 212.4 lb

## 2016-05-10 DIAGNOSIS — M06831 Other specified rheumatoid arthritis, right wrist: Secondary | ICD-10-CM | POA: Insufficient documentation

## 2016-05-10 DIAGNOSIS — M06832 Other specified rheumatoid arthritis, left wrist: Secondary | ICD-10-CM | POA: Insufficient documentation

## 2016-05-10 DIAGNOSIS — Z87442 Personal history of urinary calculi: Secondary | ICD-10-CM | POA: Insufficient documentation

## 2016-05-10 DIAGNOSIS — Z811 Family history of alcohol abuse and dependence: Secondary | ICD-10-CM | POA: Insufficient documentation

## 2016-05-10 DIAGNOSIS — Z981 Arthrodesis status: Secondary | ICD-10-CM | POA: Insufficient documentation

## 2016-05-10 DIAGNOSIS — M25531 Pain in right wrist: Secondary | ICD-10-CM

## 2016-05-10 DIAGNOSIS — M25532 Pain in left wrist: Secondary | ICD-10-CM

## 2016-05-10 DIAGNOSIS — Z801 Family history of malignant neoplasm of trachea, bronchus and lung: Secondary | ICD-10-CM | POA: Insufficient documentation

## 2016-05-10 DIAGNOSIS — Z7982 Long term (current) use of aspirin: Secondary | ICD-10-CM | POA: Insufficient documentation

## 2016-05-10 DIAGNOSIS — Z9889 Other specified postprocedural states: Secondary | ICD-10-CM | POA: Insufficient documentation

## 2016-05-10 DIAGNOSIS — Z836 Family history of other diseases of the respiratory system: Secondary | ICD-10-CM | POA: Insufficient documentation

## 2016-05-10 DIAGNOSIS — F17210 Nicotine dependence, cigarettes, uncomplicated: Secondary | ICD-10-CM | POA: Insufficient documentation

## 2016-05-10 DIAGNOSIS — M069 Rheumatoid arthritis, unspecified: Secondary | ICD-10-CM

## 2016-05-10 DIAGNOSIS — Z8261 Family history of arthritis: Secondary | ICD-10-CM | POA: Insufficient documentation

## 2016-05-10 DIAGNOSIS — M48061 Spinal stenosis, lumbar region without neurogenic claudication: Secondary | ICD-10-CM | POA: Insufficient documentation

## 2016-05-10 DIAGNOSIS — Z833 Family history of diabetes mellitus: Secondary | ICD-10-CM | POA: Insufficient documentation

## 2016-05-10 LAB — POCT CBG (FASTING - GLUCOSE)-MANUAL ENTRY: Glucose Fasting, POC: 87 mg/dL (ref 70–99)

## 2016-05-10 LAB — POCT GLYCOSYLATED HEMOGLOBIN (HGB A1C): HEMOGLOBIN A1C: 5.7

## 2016-05-10 NOTE — Assessment & Plan Note (Signed)
B/l wrist pain without swelling Negative rheumatoid factor and uric acid level  Plan: Continue prednisone for now Checking x-ray Neurology referral for EMG of UE ? OA ? Referral to hand surgery Patient uninsured

## 2016-05-10 NOTE — Patient Instructions (Addendum)
Dossie was seen today for wrist pain.  Diagnoses and all orders for this visit:  Rheumatoid arthritis involving multiple sites, unspecified rheumatoid factor presence ([place]) -     Cancel: Uric Acid -     Ambulatory referral to Neurology -     POCT glycosylated hemoglobin (Hb A1C) -     Glucose (CBG), Fasting  Bilateral wrist pain -     DG Wrist Complete Right; Future -     DG Wrist Complete Left; Future   F/u in 4 weeks for BP check and wrist pain   Dr. Adrian Blackwater

## 2016-05-10 NOTE — Progress Notes (Signed)
LOGO@  Subjective:  Patient ID: Erik Shaffer, male    DOB: Jul 10, 1965  Age: 51 y.o. MRN: [id number]  CC: Wrist Pain   HPI Erik Shaffer is a smoker with hx of cervical disc protrusion causing spinal stenosis s/p cervical vertebral fusion,  lumbar herniated disk d/p microdiscectomy in lumbar spine and  knee arthritis he presents for   1. Chronic joint pain: reports he was diagnosed with Rheumatoid Arthritis in 2015 in [place]. He was treated by Rheumatology by Dr. Amil Amen, then Dr. Titus Mould at [facility] on methotrexate but did not tolerate it at all and have severe leukopenia and nausea and vomiting.   He is now on prednisone 20 mg daily. He took 40 mg for a few days  last week due to worsening pain related to cold weather. He majority of pain is in both hands and wrist. No rash or joint swelling. He has decreased range of motion in wrist and pain with light touch.   We did labs last visit. He had negative rheumatoid factor and negative uric acid level.  He admits to eating a high sugar diet.    Past Medical History:  Diagnosis Date  . Arthritis   . Depression   . Kidney stones   . Ulcer [facility])     Past Surgical History:  Procedure Laterality Date  . HEMILAMINOTOMY LUMBAR SPINE  06/24/2010  . MICRODISCECTOMY LUMBAR     L5-S1  . SPINE SURGERY  02/2011   Dr. Sherley Bounds     Family History  Problem Relation Age of Onset  . Alcohol abuse Mother   . Cancer Father   . Arthritis Other   . COPD Other     Lung cancer  . Hyperlipidemia Other   . Diabetes Other     Social History  Substance Use Topics  . Smoking status: Current Every Day Smoker    Packs/day: 0.25    Years: 30.00    Types: Cigarettes  . Smokeless tobacco: Never Used  . Alcohol use No    ROS Review of Systems  Constitutional: Negative for chills, fatigue, fever and unexpected weight change.  Eyes: Negative for visual disturbance.  Respiratory: Negative for cough and shortness of breath.    Cardiovascular: Negative for chest pain, palpitations and leg swelling.  Gastrointestinal: Negative for abdominal pain, blood in stool, constipation, diarrhea, nausea and vomiting.  Endocrine: Negative for polydipsia, polyphagia and polyuria.  Musculoskeletal: Positive for arthralgias, back pain and gait problem. Negative for myalgias and neck pain.  Skin: Negative for rash.  Allergic/Immunologic: Negative for immunocompromised state.  Hematological: Negative for adenopathy. Does not bruise/bleed easily.  Psychiatric/Behavioral: Positive for dysphoric mood. Negative for sleep disturbance and suicidal ideas. The patient is not nervous/anxious.     Objective:   Today's Vitals: BP (!) 145/85 (BP Location: Left Arm, Patient Position: Sitting, Cuff Size: Small)   Pulse 88   Temp 97.7 F (36.5 C) (Oral)   Ht 5\' 11"  (1.803 m)   Wt 212 lb 6.4 oz (96.3 kg)   SpO2 98%   BMI 29.62 kg/m  BP Readings from Last 3 Encounters:  05/10/16 (!) 145/85  03/01/16 139/82  06/14/12 (!) 120/54    Physical Exam  Constitutional: He appears well-developed and well-nourished. No distress.  HENT:  Head: Normocephalic and atraumatic.  Neck: Normal range of motion. Neck supple.  Cardiovascular: Normal rate, regular rhythm, normal heart sounds and intact distal pulses.   Pulmonary/Chest: Effort normal and breath sounds normal.  Musculoskeletal: He exhibits edema and tenderness.       Right wrist: He exhibits decreased range of motion and tenderness. He exhibits no bony tenderness, no swelling, no effusion, no crepitus and no deformity.       Left wrist: He exhibits decreased range of motion and tenderness. He exhibits no bony tenderness, no swelling, no deformity and no laceration.       Right knee: He exhibits swelling and effusion. He exhibits normal range of motion.       Left knee: He exhibits swelling and effusion. He exhibits normal range of motion.       Right hand: He exhibits deformity and swelling.   Neurological: He is alert.  Skin: Skin is warm and dry. No rash noted. No erythema.  Psychiatric: He has a normal mood and affect.   Lab Results  Component Value Date   HGBA1C 5.7 05/10/2016   CBG 87  Depression screen PHQ 2/9 03/01/2016  Decreased Interest 3  Down, Depressed, Hopeless 3  PHQ - 2 Score 6  Altered sleeping 1  Tired, decreased energy 3  Change in appetite 0  Feeling bad or failure about yourself  2  Trouble concentrating 0  Moving slowly or fidgety/restless 0  Suicidal thoughts 1  PHQ-9 Score 13   GAD 7 : Generalized Anxiety Score 03/01/2016  Nervous, Anxious, on Edge 0  Control/stop worrying 0  Worry too much - different things 0  Trouble relaxing 3  Restless 3  Easily annoyed or irritable 1  Afraid - awful might happen 0  Total GAD 7 Score 7     Assessment & Plan:   Problem List Items Addressed This Visit      High   Rheumatoid arthritis ([place]) - Primary (Chronic)   Relevant Orders   Ambulatory referral to Neurology   POCT glycosylated hemoglobin (Hb A1C) (Completed)   Glucose (CBG), Fasting (Completed)    Other Visit Diagnoses    Bilateral wrist pain       Relevant Orders   DG Wrist Complete Right   DG Wrist Complete Left      Outpatient Encounter Prescriptions as of 05/10/2016  Medication Sig  . aspirin EC 81 MG tablet Take 1 tablet (81 mg total) by mouth daily.  Marland Kitchen atorvastatin (LIPITOR) 40 MG tablet Take 1 tablet (40 mg total) by mouth daily.  Marland Kitchen omeprazole (PRILOSEC) 20 MG capsule Take 1 capsule (20 mg total) by mouth daily. Reported on 04/23/2015  . predniSONE (DELTASONE) 20 MG tablet Take 1 tablet (20 mg total) by mouth daily with breakfast.  . triamcinolone cream (KENALOG) 0.1 % Apply 1 application topically 2 (two) times daily.   No facility-administered encounter medications on file as of 05/10/2016.     Follow-up: Return in about 4 weeks (around 06/07/2016) for wrist and hand pain .    Boykin Nearing MD

## 2016-05-12 ENCOUNTER — Ambulatory Visit (HOSPITAL_COMMUNITY)
Admission: RE | Admit: 2016-05-12 | Discharge: 2016-05-12 | Disposition: A | Discharge status: Home / Self Care | Payer: Self-pay | Source: Ambulatory Visit | Attending: Family Medicine | Admitting: Family Medicine

## 2016-05-12 DIAGNOSIS — M25531 Pain in right wrist: Secondary | ICD-10-CM | POA: Insufficient documentation

## 2016-05-12 DIAGNOSIS — M25532 Pain in left wrist: Secondary | ICD-10-CM | POA: Insufficient documentation

## 2016-05-12 DIAGNOSIS — R937 Abnormal findings on diagnostic imaging of other parts of musculoskeletal system: Secondary | ICD-10-CM | POA: Insufficient documentation

## 2016-05-14 ENCOUNTER — Other Ambulatory Visit: Payer: Self-pay | Admitting: Family Medicine

## 2016-05-14 ENCOUNTER — Encounter: Payer: Self-pay | Admitting: Family Medicine

## 2016-05-14 ENCOUNTER — Ambulatory Visit: Payer: Self-pay | Attending: Family Medicine | Admitting: Family Medicine

## 2016-05-14 ENCOUNTER — Telehealth: Payer: Self-pay

## 2016-05-14 VITALS — BP 135/89 | HR 76 | Temp 98.0°F | Ht 71.75 in | Wt 212.6 lb

## 2016-05-14 DIAGNOSIS — Z87442 Personal history of urinary calculi: Secondary | ICD-10-CM | POA: Insufficient documentation

## 2016-05-14 DIAGNOSIS — M25531 Pain in right wrist: Secondary | ICD-10-CM

## 2016-05-14 DIAGNOSIS — Z981 Arthrodesis status: Secondary | ICD-10-CM | POA: Insufficient documentation

## 2016-05-14 DIAGNOSIS — Z7982 Long term (current) use of aspirin: Secondary | ICD-10-CM | POA: Insufficient documentation

## 2016-05-14 DIAGNOSIS — M25532 Pain in left wrist: Principal | ICD-10-CM

## 2016-05-14 DIAGNOSIS — Z888 Allergy status to other drugs, medicaments and biological substances status: Secondary | ICD-10-CM | POA: Insufficient documentation

## 2016-05-14 DIAGNOSIS — M174 Other bilateral secondary osteoarthritis of knee: Secondary | ICD-10-CM

## 2016-05-14 DIAGNOSIS — Z7952 Long term (current) use of systemic steroids: Secondary | ICD-10-CM | POA: Insufficient documentation

## 2016-05-14 DIAGNOSIS — Z91018 Allergy to other foods: Secondary | ICD-10-CM | POA: Insufficient documentation

## 2016-05-14 DIAGNOSIS — M069 Rheumatoid arthritis, unspecified: Secondary | ICD-10-CM | POA: Insufficient documentation

## 2016-05-14 DIAGNOSIS — M170 Bilateral primary osteoarthritis of knee: Secondary | ICD-10-CM | POA: Insufficient documentation

## 2016-05-14 MED ORDER — METHOCARBAMOL 750 MG PO TABS: 750.0000 mg | ORAL_TABLET | Freq: Three times a day (TID) | ORAL | 0 refills | Status: DC | PRN

## 2016-05-14 MED ORDER — GABAPENTIN 300 MG PO CAPS: 300.0000 mg | ORAL_CAPSULE | Freq: Two times a day (BID) | ORAL | 0 refills | Status: DC

## 2016-05-14 MED FILL — METHOCARBAMOL 750 MG TABLET: 750 | 30 days supply | Qty: 90 | Fill #0

## 2016-05-14 MED FILL — GABAPENTIN 300 MG CAPSULE: 300 | 30 days supply | Qty: 60 | Fill #0

## 2016-05-14 NOTE — Progress Notes (Signed)
Subjective:  Patient ID: Erik Shaffer, male    DOB: 1965/10/25  Age: 51 y.o. MRN: [id number]  CC: Rheumatoid Arthritis and medication request (methocarbamol and gabapentin)   HPI Erik Shaffer is a 51 year old male with a history of L5-S1 microdiscectomy, C-spine fusion, rheumatoid arthritis who presents today with chronic pain and stiffness of both wrists and inability to flex his hands. He was seen by his PCP and referred to neurology at his last visit. He remains on chronic prednisone 20 mg daily with no much improvement in symptoms. He is requesting gabapentin and Robaxin as he took some of his sisters' and felt relief in his symptoms.  Past Medical History:  Diagnosis Date  . Arthritis   . Depression   . Kidney stones   . Ulcer Erik Shaffer)     Past Surgical History:  Procedure Laterality Date  . HEMILAMINOTOMY LUMBAR SPINE  06/24/2010  . MICRODISCECTOMY LUMBAR     L5-S1  . SPINE SURGERY  02/2011   Dr. Sherley Bounds     Allergies  Allergen Reactions  . Tomato   . Butrans [Buprenorphine]     fatigue     Outpatient Medications Prior to Visit  Medication Sig Dispense Refill  . predniSONE (DELTASONE) 20 MG tablet Take 1 tablet (20 mg total) by mouth daily with breakfast. 30 tablet 5  . triamcinolone cream (KENALOG) 0.1 % Apply 1 application topically 2 (two) times daily. 30 g 0  . aspirin EC 81 MG tablet Take 1 tablet (81 mg total) by mouth daily. (Patient not taking: Reported on 05/14/2016) 90 tablet 3  . atorvastatin (LIPITOR) 40 MG tablet Take 1 tablet (40 mg total) by mouth daily. (Patient not taking: Reported on 05/14/2016) 90 tablet 3  . omeprazole (PRILOSEC) 20 MG capsule Take 1 capsule (20 mg total) by mouth daily. Reported on 04/23/2015 (Patient not taking: Reported on 05/10/2016) 30 capsule 5   No facility-administered medications prior to visit.     ROS Review of Systems  Constitutional: Negative for activity change and appetite change.  HENT: Negative for sinus  pressure and sore throat.   Respiratory: Negative for chest tightness, shortness of breath and wheezing.   Cardiovascular: Negative for chest pain and palpitations.  Gastrointestinal: Negative for abdominal distention, abdominal pain and constipation.  Genitourinary: Negative.   Musculoskeletal:       See hpi  Psychiatric/Behavioral: Negative for behavioral problems and dysphoric mood.    Objective:  BP 135/89 (BP Location: Right Arm, Patient Position: Sitting, Cuff Size: Large)   Pulse 76   Temp 98 F (36.7 C) (Oral)   Ht 5' 11.75" (1.822 m)   Wt 212 lb 9.6 oz (96.4 kg)   SpO2 (!) 76%   BMI 29.04 kg/m   BP/Weight 05/14/2016 05/10/2016 [id number]  Systolic BP [id number]  Diastolic BP 89 85 82  Wt. (Lbs) 212.6 212.4 214.4  BMI 29.04 29.62 29.9      Physical Exam  Constitutional: He is oriented to person, place, and time. He appears well-developed and well-nourished.  Cardiovascular: Normal rate, normal heart sounds and intact distal pulses.   No murmur heard. Pulmonary/Chest: Effort normal and breath sounds normal. He has no wheezes. He has no rales. He exhibits no tenderness.  Abdominal: Soft. Bowel sounds are normal. He exhibits no distension and no mass. There is no tenderness.  Musculoskeletal:  Hands -Unable to make a fist in both hands; limited range of motion on flexion and extension of both wrists. Knees-crepitus  on range of motion.  Neurological: He is alert and oriented to person, place, and time.     Assessment & Plan:   1. Bilateral wrist pain Patient reports improvement of symptoms with the use of gabapentin and Robaxin - methocarbamol (ROBAXIN-750) 750 MG tablet; Take 1 tablet (750 mg total) by mouth every 8 (eight) hours as needed for muscle spasms.  Dispense: 90 tablet; Refill: 0 - gabapentin (NEURONTIN) 300 MG capsule; Take 1 capsule (300 mg total) by mouth 2 (two) times daily.  Dispense: 60 capsule; Refill: 0  2. Other secondary osteoarthritis of both  knees Stable   Meds ordered this encounter  Medications  . methocarbamol (ROBAXIN-750) 750 MG tablet    Sig: Take 1 tablet (750 mg total) by mouth every 8 (eight) hours as needed for muscle spasms.    Dispense:  90 tablet    Refill:  0  . gabapentin (NEURONTIN) 300 MG capsule    Sig: Take 1 capsule (300 mg total) by mouth 2 (two) times daily.    Dispense:  60 capsule    Refill:  0    Follow-up: Return in about 1 month (around 06/14/2016) for follow up with PCP (Dr Adrian Blackwater) - management of chronic medical conditions.   Arnoldo Morale MD

## 2016-05-14 NOTE — Telephone Encounter (Signed)
Contacted pt to go over xray results pt is aware of results and states he was contacted by the ortho and he wasn't able to make an appointment because he was told he had to have his orange card in order to schedule the appointment. Pt states he will call Monday morning to see if there is something he get so he can get his appointment

## 2016-05-18 ENCOUNTER — Ambulatory Visit (INDEPENDENT_AMBULATORY_CARE_PROVIDER_SITE_OTHER): Payer: Self-pay | Admitting: Orthopaedic Surgery

## 2016-05-20 MED FILL — ?PREDNISONE 20 MG TABLET: 20 | 30 days supply | Qty: 30 | Fill #2

## 2016-05-25 ENCOUNTER — Ambulatory Visit (INDEPENDENT_AMBULATORY_CARE_PROVIDER_SITE_OTHER): Payer: Self-pay | Admitting: Orthopaedic Surgery

## 2016-05-25 DIAGNOSIS — G8929 Other chronic pain: Secondary | ICD-10-CM | POA: Insufficient documentation

## 2016-05-25 DIAGNOSIS — M25561 Pain in right knee: Secondary | ICD-10-CM

## 2016-05-25 DIAGNOSIS — M174 Other bilateral secondary osteoarthritis of knee: Secondary | ICD-10-CM

## 2016-05-25 DIAGNOSIS — M25521 Pain in right elbow: Secondary | ICD-10-CM | POA: Insufficient documentation

## 2016-05-25 DIAGNOSIS — M069 Rheumatoid arthritis, unspecified: Secondary | ICD-10-CM

## 2016-05-25 MED ORDER — DICLOFENAC SODIUM 75 MG PO TBEC: 75.0000 mg | DELAYED_RELEASE_TABLET | Freq: Two times a day (BID) | ORAL | 2 refills | Status: DC

## 2016-05-25 MED ORDER — DICLOFENAC SODIUM 1 % TD GEL: 2.0000 g | Freq: Four times a day (QID) | TRANSDERMAL | 5 refills | Status: DC

## 2016-05-25 MED FILL — DICLOFENAC SODIUM 1% GEL: 1 | 12 days supply | Qty: 100 | Fill #0

## 2016-05-25 MED FILL — DICLOFENAC SOD DR 75 MG TAB: 75 | 15 days supply | Qty: 30 | Fill #0

## 2016-05-25 NOTE — Progress Notes (Signed)
Office Visit Note   Patient: Erik Shaffer           Date of Birth: [birth year]           MRN: [id number] Visit Date: 05/25/2016              Requested by: Boykin Nearing, MD [address] [place], [place] [address] PCP: Minerva Ends, MD   Assessment & Plan: Visit Diagnoses:  1. Rheumatoid arthritis involving multiple sites, unspecified rheumatoid factor presence ([place])   2. Chronic pain of right knee   3. Pain in right elbow   4. Other secondary osteoarthritis of both knees     Plan: Total face to face encounter time was greater than 45 minutes and over half of this time was spent in counseling and/or coordination of care. Patient has severe DJD of left wrist.  Arthrodesis would be option for pain relief but would limit his mobility.  The knee and elbow do not bother him significantly since he takes prednisone.  Referral to to rheumatologist for further work up.  I think he has underlying inflammatory arthritis.  F/u prn  Follow-Up Instructions: Return if symptoms worsen or fail to improve.   Orders:  Orders Placed This Encounter  Procedures  . Ambulatory referral to Rheumatology   Meds ordered this encounter  Medications  . diclofenac sodium (VOLTAREN) 1 % GEL    Sig: Apply 2 g topically 4 (four) times daily.    Dispense:  1 Tube    Refill:  5  . diclofenac (VOLTAREN) 75 MG EC tablet    Sig: Take 1 tablet (75 mg total) by mouth 2 (two) times daily.    Dispense:  30 tablet    Refill:  2      Procedures: No procedures performed   Clinical Data: No additional findings.   Subjective: Chief Complaint  Patient presents with  . Right Wrist - Pain  . Left Wrist - Pain    51 yo male with right elbow, left wrist, right knee pain for a long time.  Left wrist is bothering him the worst.  Denies injuries of left wrist. Plays a lot of drums and left wrist does limit him when doing this.  Right knee is painful and has effusion which he takes prednisone for.  He's  been previously diagnosed with RA but he didn't want to take any immunosuppressants.  His right elbow also has limited flexion and he has chronic pain in this.     Review of Systems  Constitutional: Negative.   All other systems reviewed and are negative.    Objective: Vital Signs: There were no vitals taken for this visit.  Physical Exam  Constitutional: He is oriented to person, place, and time. He appears well-developed and well-nourished.  HENT:  Head: Normocephalic and atraumatic.  Eyes: Pupils are equal, round, and reactive to light.  Neck: Neck supple.  Pulmonary/Chest: Effort normal.  Abdominal: Soft.  Musculoskeletal:       Right knee: He exhibits effusion.  Neurological: He is alert and oriented to person, place, and time.  Skin: Skin is warm.  Psychiatric: He has a normal mood and affect. His behavior is normal. Judgment and thought content normal.  Nursing note and vitals reviewed. Left wrist exam demonstrates crepitus with ROM and pain.  No effusion  Right Knee Exam   Range of Motion  Extension: normal  Flexion: abnormal   Muscle Strength   The patient has normal right knee  strength.  Tests  [place]:  Medial - negative Lateral - negative Varus: negative Valgus: negative  Other  Sensation: normal Pulse: present Swelling: none Other tests: effusion present   Right Elbow Exam   Range of Motion  Extension: normal  Flexion: abnormal  Pronation: normal  Supination: normal   Muscle Strength  The patient has normal right elbow strength.      Specialty Comments:  No specialty comments available.  Imaging: No results found.   PMFS History: Patient Active Problem List   Diagnosis Date Noted  . Chronic pain of right knee 05/25/2016  . Pain in right elbow 05/25/2016  . Bilateral wrist pain 05/10/2016  . Hyperlipidemia 03/02/2016  . Rheumatoid arthritis ([place]) 03/01/2016  . Dermatitis 03/01/2016  . Mood disorder ([place]) 03/01/2016  .  Cervical vertebral fusion syndrome 06/14/2012  . ED (erectile dysfunction) 01/20/2012  . DJD (degenerative joint disease) of knee 10/13/2011  . Tobacco abuse 03/10/2011   Past Medical History:  Diagnosis Date  . Arthritis   . Depression   . Kidney stones   . Ulcer ([place])     Family History  Problem Relation Age of Onset  . Alcohol abuse Mother   . Cancer Father   . Arthritis Other   . COPD Other     Lung cancer  . Hyperlipidemia Other   . Diabetes Other     Past Surgical History:  Procedure Laterality Date  . HEMILAMINOTOMY LUMBAR SPINE  06/24/2010  . MICRODISCECTOMY LUMBAR     L5-S1  . SPINE SURGERY  02/2011   Dr. Sherley Bounds    Social History   Occupational History  . maintenance    Social History Main Topics  . Smoking status: Current Every Day Smoker    Packs/day: 1.00    Years: 30.00    Types: Cigarettes  . Smokeless tobacco: Never Used  . Alcohol use No  . Drug use: No  . Sexual activity: Yes

## 2016-06-09 ENCOUNTER — Other Ambulatory Visit: Payer: Self-pay | Admitting: Family Medicine

## 2016-06-09 DIAGNOSIS — M25531 Pain in right wrist: Secondary | ICD-10-CM

## 2016-06-09 DIAGNOSIS — M25532 Pain in left wrist: Principal | ICD-10-CM

## 2016-06-09 MED FILL — DICLOFENAC SOD DR 75 MG TAB: 75 | 15 days supply | Qty: 30 | Fill #1

## 2016-06-09 MED FILL — VOLTAREN 1% GEL: 1 | 12 days supply | Qty: 100 | Fill #1

## 2016-06-09 NOTE — Telephone Encounter (Signed)
Pt. Sister came into facility stating that pt. predniSONE (DELTASONE) 20 MG tablet  Is not helping at all. She would like to know if the medication can be increased due to pt.  Taking more than one pill a day. Please f/u with pt. If  Pt. Can not be reached at his phone He can be reached at [phone].

## 2016-06-11 ENCOUNTER — Other Ambulatory Visit: Payer: Self-pay

## 2016-06-11 ENCOUNTER — Telehealth: Payer: Self-pay | Admitting: Family Medicine

## 2016-06-11 DIAGNOSIS — M069 Rheumatoid arthritis, unspecified: Secondary | ICD-10-CM

## 2016-06-11 DIAGNOSIS — M25531 Pain in right wrist: Secondary | ICD-10-CM

## 2016-06-11 DIAGNOSIS — M25532 Pain in left wrist: Principal | ICD-10-CM

## 2016-06-11 MED ORDER — PREDNISONE 20 MG PO TABS: 20.0000 mg | ORAL_TABLET | Freq: Every day | ORAL | 5 refills | Status: DC

## 2016-06-11 MED ORDER — METHOCARBAMOL 750 MG PO TABS: 750.0000 mg | ORAL_TABLET | Freq: Three times a day (TID) | ORAL | 0 refills | Status: DC | PRN

## 2016-06-11 MED FILL — ?PREDNISONE 20 MG TABLET: 20 | 30 days supply | Qty: 60 | Fill #0

## 2016-06-11 MED FILL — METHOCARBAMOL 750 MG TABLET: 750 | 30 days supply | Qty: 90 | Fill #0

## 2016-06-11 NOTE — Telephone Encounter (Signed)
Called  Patient  Verified DOB He is swelling more in knees and more pain in wrist He attributes this to the rain He is taking prednisone 60 mg when pain in flared for one day, 40 mg one day, then back 20   Changed Rx to reflect this change.

## 2016-06-16 ENCOUNTER — Ambulatory Visit (INDEPENDENT_AMBULATORY_CARE_PROVIDER_SITE_OTHER): Payer: Self-pay | Admitting: Neurology

## 2016-06-16 ENCOUNTER — Encounter: Payer: Self-pay | Admitting: Neurology

## 2016-06-16 VITALS — BP 140/90 | HR 75 | Ht 71.0 in | Wt 216.4 lb

## 2016-06-16 DIAGNOSIS — G5603 Carpal tunnel syndrome, bilateral upper limbs: Secondary | ICD-10-CM

## 2016-06-16 DIAGNOSIS — M130 Polyarthritis, unspecified: Secondary | ICD-10-CM

## 2016-06-16 NOTE — Patient Instructions (Signed)
You have carpal tunnel syndrome in both hands.  Continue to use a wrist splint for this. If your tingling gets worse, please call my office and we can do nerve testing to assess this.

## 2016-06-16 NOTE — Progress Notes (Signed)
[facility] Clinic Note - Initial Visit   Date: 06/16/16  Erik Shaffer MRN: [id number] DOB: Sep 15, 1965   Dear Dr. Adrian Blackwater:  Thank you for your kind referral of Erik Shaffer for consultation of bilateral wrist pain. Although his history is well known to you, please allow [name] to reiterate it for the purpose of our medical record. The patient was accompanied to the clinic by sister who also provides collateral information.     History of Present Illness: Erik Shaffer is a 51 y.o. right-handed Caucasian male with rheumatoid arthritis, depression, and lumbar microdisectomy and hemilaminectomy at L5-S1, and hyperlipidemia presenting for evaluation of bilateral wrist pain.    Starting in 2013, he started having bilateral wrist pain, described as tight vice-like sensation, as if someone was trying to push his hands off his wrists.  His wrist and finger range of motion is restricted.  He also has swelling of the wrists, fingers, and ankles. He was diagnosed with seronegative rheumatoid arthritis and was previously taking methotrexate which he did not tolerate and now takes prednisone 20mg .  His pain is very severe and he has seen pain management previously.  Most recently, he was evaluated by orthopeadics surgeon who recommended wrist fusion, but that it will severely limit his wrist range of motion, so does not wish to have this right now.   He has occasional numbness and tingling of the thumb and finger tips.  He also takes gabapentin 300mg  twice daily for this.  He has previously seen pain management.  He endorses some weakness with grip, especially when pain is severe.   He was diagnosed with bilateral carpal tunnel syndrome in is 40s and is not bothered by his tingling.  He has tried wearing wrist splints in the past.  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    Out-side paper records, electronic  medical record, and images have been reviewed where available and summarized as:  Lab Results  Component Value Date   TSH 0.36 10/13/2011   Lab Results  Component Value Date   HGBA1C 5.7 05/10/2016   Past Medical History:  Diagnosis Date  . Arthritis   . Depression   . Kidney stones   . Ulcer [facility])     Past Surgical History:  Procedure Laterality Date  . HEMILAMINOTOMY LUMBAR SPINE  06/24/2010  . MICRODISCECTOMY LUMBAR     L5-S1  . SPINE SURGERY  02/2011   Dr. Sherley Bounds      Medications:  Outpatient Encounter Prescriptions as of 06/16/2016  Medication Sig  . aspirin EC 81 MG tablet Take 1 tablet (81 mg total) by mouth daily. (Patient not taking: Reported on 05/25/2016)  . atorvastatin (LIPITOR) 40 MG tablet Take 1 tablet (40 mg total) by mouth daily. (Patient not taking: Reported on 05/25/2016)  . diclofenac (VOLTAREN) 75 MG EC tablet Take 1 tablet (75 mg total) by mouth 2 (two) times daily.  . diclofenac sodium (VOLTAREN) 1 % GEL Apply 2 g topically 4 (four) times daily.  Marland Kitchen gabapentin (NEURONTIN) 300 MG capsule Take 1 capsule (300 mg total) by mouth 2 (two) times daily.  . methocarbamol (ROBAXIN-750) 750 MG tablet Take 1 tablet (750 mg total) by mouth every 8 (eight) hours as needed for muscle spasms.  Marland Kitchen omeprazole (PRILOSEC) 20 MG capsule Take 1 capsule (20 mg total) by mouth daily. Reported on 04/23/2015  . predniSONE (DELTASONE) 20 MG  tablet Take 1 tablet (20 mg total) by mouth daily with breakfast. Take  60 mg for one day, then 40 mg for one day with flares.  . triamcinolone cream (KENALOG) 0.1 % Apply 1 application topically 2 (two) times daily.   No facility-administered encounter medications on file as of 06/16/2016.      Allergies:  Allergies  Allergen Reactions  . Tomato   . Butrans [Buprenorphine]     fatigue    Family History: Family History  Problem Relation Age of Onset  . Alcohol abuse Mother   . Cancer Father   . Arthritis Other   . COPD Other      Lung cancer  . Hyperlipidemia Other   . Diabetes Other     Social History: Social History  Substance Use Topics  . Smoking status: Current Every Day Smoker    Packs/day: 1.00    Years: 30.00    Types: Cigarettes  . Smokeless tobacco: Never Used  . Alcohol use No   Social History   Social History Narrative   Caffienated drinks-yes   Seat belt use often-yes   Regular Exercise-no   Smoke alarm in the home-yes   Firearms/guns in the home-yes   History of physical abuse-no   HLE-12th grade   Lives with sister and her husband in a one story home.  Has no children.  Not married. Currently trying to get disability.                Review of Systems:  CONSTITUTIONAL: No fevers, chills, night sweats, or weight loss.   EYES: No visual changes or eye pain ENT: No hearing changes.  No history of nose bleeds.   RESPIRATORY: No cough, wheezing and shortness of breath.   CARDIOVASCULAR: Negative for chest pain, and palpitations.   GI: Negative for abdominal discomfort, blood in stools or black stools.  No recent change in bowel habits.   GU:  No history of incontinence.   MUSCLOSKELETAL: + history of joint pain or swelling.  No myalgias.   SKIN: Negative for lesions, rash, and itching.   HEMATOLOGY/ONCOLOGY: Negative for prolonged bleeding, bruising easily, and swollen nodes.  No history of cancer.   ENDOCRINE: Negative for cold or heat intolerance, polydipsia or goiter.   PSYCH:  +depression or anxiety symptoms.   NEURO: As Above.   Vital Signs:  BP 140/90   Pulse 75   Ht 5\' 11"  (1.803 m)   Wt 216 lb 6 oz (98.1 kg)   SpO2 98%   BMI 30.18 kg/m    General Medical Exam:   General:  Anxious appearing, comfortable.   Eyes/ENT: see cranial nerve examination.   Neck: No masses appreciated.  Full range of motion without tenderness.  No carotid bruits. Respiratory:  Clear to auscultation, good air entry bilaterally.   Cardiac:  Regular rate and rhythm, no murmur.   Extremities:   No deformities, edema, or skin discoloration.   Neurological Exam: MENTAL STATUS including orientation to time, place, person, recent and remote memory, attention span and concentration, language, and fund of knowledge is normal.  Speech is not dysarthric.  CRANIAL NERVES: II:  No visual field defects.  Unremarkable fundi.   III-IV-VI: Pupils equal round and reactive to light.  Normal conjugate, extra-ocular eye movements in all directions of gaze.  No nystagmus.  No ptosis.   V:  Normal facial sensation.  VII:  Normal facial symmetry and movements.    VIII:  Normal hearing and vestibular function.  IX-X:  Normal palatal movement.   XI:  Normal shoulder shrug and head rotation.   XII:  Normal tongue strength and range of motion, no deviation or fasciculation.  MOTOR:  No atrophy, fasciculations or abnormal movements.  No pronator drift.  Tone is normal.    Right Upper Extremity:    Left Upper Extremity:    Deltoid  5/5   Deltoid  5/5   Biceps  5/5   Biceps  5/5   Triceps  5/5   Triceps  5/5   Wrist extensors  5/5   Wrist extensors  5/5   Wrist flexors  5/5   Wrist flexors  5/5   Finger extensors  5/5   Finger extensors  5/5   Finger flexors  5/5   Finger flexors  5/5   Dorsal interossei  5/5   Dorsal interossei  5/5   Abductor pollicis  5/5   Abductor pollicis  5/5   Tone (Ashworth scale)  0  Tone (Ashworth scale)  0   Right Lower Extremity:    Left Lower Extremity:    Hip flexors  5/5   Hip flexors  5/5   Hip extensors  5/5   Hip extensors  5/5   Knee flexors  5/5   Knee flexors  5/5   Knee extensors  5/5   Knee extensors  5/5   Dorsiflexors  5/5   Dorsiflexors  5/5   Plantarflexors  5/5   Plantarflexors  5/5   Toe extensors  5/5   Toe extensors  5/5   Toe flexors  5/5   Toe flexors  5/5   Tone (Ashworth scale)  0  Tone (Ashworth scale)  0   MSRs:  Right                                                                 Left brachioradialis 2+  brachioradialis 2+  biceps 2+   biceps 2+  triceps 2+  triceps 2+  patellar 2+  patellar 2+  ankle jerk 1+  ankle jerk 1+  Hoffman no  Hoffman no  plantar response down  plantar response down   SENSORY:  Reduced pin prick over the median nerve distribution bilaterally.  Vibration is mildly reduced at the great toe bilaterally.  Pin prick and light touch intact throughout.  Romberg's sign absent.   COORDINATION/GAIT: Normal finger-to- nose-finger.  Intact rapid alternating movements bilaterally.  Able to rise from a chair without using arms.  Gait narrow based and stable. Tandem and stressed gait intact.    IMPRESSION: Bilateral carpal tunnel syndrome causing mild paresthesias of the hands. Patient has been aware of this since his 40s and has had persistent symptoms of tingling and numbness over the median distribution, aggravated by playing the drums.  There is no weakness or atrophy of abductor pollicis brevis muscles, suggesting symptoms are not severe.  I offered to perform NCS/EMG of the hands to reassess the severity, but he is not bothered by his paresthesias so declined additional testing.  Most of his pain and discomfort is stemming from arthritis involving the wrist and he is being followed by orthopeadics and rheumatology for this.  If his paresthesias get worse, patient will call my office to schedule electrodiagnostic testing.  In the meantime, I encouraged him to continue to use a wrist splint.  The duration of this appointment visit was 35 minutes of face-to-face time with the patient.  Greater than 50% of this time was spent in counseling, explanation of diagnosis, planning of further management, and coordination of care.   Thank you for allowing me to participate in patient's care.  If I can answer any additional questions, I would be pleased to do so.    Sincerely,    Donika K. Posey Pronto, DO

## 2016-07-02 ENCOUNTER — Encounter: Payer: Self-pay | Admitting: Family Medicine

## 2016-07-02 ENCOUNTER — Ambulatory Visit: Payer: Self-pay | Attending: Family Medicine | Admitting: Family Medicine

## 2016-07-02 VITALS — BP 164/84 | HR 87 | Temp 98.6°F | Ht 72.0 in | Wt 212.4 lb

## 2016-07-02 DIAGNOSIS — M25542 Pain in joints of left hand: Secondary | ICD-10-CM | POA: Insufficient documentation

## 2016-07-02 DIAGNOSIS — M545 Low back pain, unspecified: Secondary | ICD-10-CM

## 2016-07-02 DIAGNOSIS — F329 Major depressive disorder, single episode, unspecified: Secondary | ICD-10-CM | POA: Insufficient documentation

## 2016-07-02 DIAGNOSIS — Z981 Arthrodesis status: Secondary | ICD-10-CM | POA: Insufficient documentation

## 2016-07-02 DIAGNOSIS — M069 Rheumatoid arthritis, unspecified: Secondary | ICD-10-CM | POA: Insufficient documentation

## 2016-07-02 DIAGNOSIS — M25532 Pain in left wrist: Secondary | ICD-10-CM | POA: Insufficient documentation

## 2016-07-02 DIAGNOSIS — I10 Essential (primary) hypertension: Secondary | ICD-10-CM | POA: Insufficient documentation

## 2016-07-02 DIAGNOSIS — Z7952 Long term (current) use of systemic steroids: Secondary | ICD-10-CM | POA: Insufficient documentation

## 2016-07-02 DIAGNOSIS — M25531 Pain in right wrist: Secondary | ICD-10-CM | POA: Insufficient documentation

## 2016-07-02 DIAGNOSIS — M25541 Pain in joints of right hand: Secondary | ICD-10-CM | POA: Insufficient documentation

## 2016-07-02 DIAGNOSIS — Z7982 Long term (current) use of aspirin: Secondary | ICD-10-CM | POA: Insufficient documentation

## 2016-07-02 DIAGNOSIS — Z87442 Personal history of urinary calculi: Secondary | ICD-10-CM | POA: Insufficient documentation

## 2016-07-02 DIAGNOSIS — G8929 Other chronic pain: Secondary | ICD-10-CM | POA: Insufficient documentation

## 2016-07-02 MED ORDER — AMLODIPINE BESYLATE 5 MG PO TABS: 5.0000 mg | ORAL_TABLET | Freq: Every day | ORAL | 3 refills | Status: DC

## 2016-07-02 MED ORDER — NAPROXEN 500 MG PO TABS: 500.0000 mg | ORAL_TABLET | Freq: Two times a day (BID) | ORAL | 2 refills | Status: DC

## 2016-07-02 MED ORDER — TIZANIDINE HCL 4 MG PO TABS: 4.0000 mg | ORAL_TABLET | Freq: Three times a day (TID) | ORAL | 3 refills | Status: DC | PRN

## 2016-07-02 MED FILL — NAPROXEN 500 MG TABLET: 500 | 30 days supply | Qty: 60 | Fill #0

## 2016-07-02 MED FILL — AMLODIPINE BESYLATE 5 MG TA: 5 | 30 days supply | Qty: 30 | Fill #0

## 2016-07-02 MED FILL — tiZANidine HCL 4 MG TABS: 4 | 30 days supply | Qty: 90 | Fill #0

## 2016-07-02 NOTE — Progress Notes (Signed)
Subjective:  Patient ID: Erik Shaffer, male    DOB: [birth year]  Age: 51 y.o. MRN: [id number]  CC: Back Pain (lower)   HPI Erik Shaffer is a 51 year old male with a history of L5-S1 microdiscectomy, C-spine fusion, rheumatoid arthritis who presents today with Lumbar back pain for the last 3 days after he bent down to pick up one of the parts to place inhis truck; he felt a sharp pain on rising up but did not hear any pop.  Denies radiation of pain into his lower extremities, numbness in extremities loss of sphincteric function.  He has chronic pain and stiffness of both wrists and inability to flex his hands. He was seen by his PCP and referred to neurology at his last visit; States he was referred to rheumatology and subsequently referred to Erik Shaffer but is yet to have an appointment. He remains on chronic prednisone 20 mg daily with no much improvement in symptoms.   His blood pressure is elevated and he endorses taking his sister's naproxen for the pain and has been taking some old Adderall pills which he hasn't home. Blood pressures prior to now have been normal but he has had an elevation of 145/85 six weeks ago. Plans to go see his psychiatrist on Monday received Benadryl for ADHD.  He is requesting to have x-rays of his elbows, knees, ankles "you might as well x-ray everything this I have pain in them if you're going to xray my back" he says.  Past Medical History:  Diagnosis Date  . Arthritis   . Depression   . Kidney stones   . Ulcer Erik Shaffer)     Past Surgical History:  Procedure Laterality Date  . HEMILAMINOTOMY LUMBAR SPINE  06/24/2010  . MICRODISCECTOMY LUMBAR     L5-S1  . SPINE SURGERY  02/2011   Erik Shaffer     Allergies  Allergen Reactions  . Tomato   . Butrans [Buprenorphine]     fatigue     Outpatient Medications Prior to Visit  Medication Sig Dispense Refill  . gabapentin (NEURONTIN) 300 MG capsule Take 1 capsule (300 mg total) by mouth 2 (two)  times daily. 60 capsule 0  . predniSONE (DELTASONE) 20 MG tablet Take 1 tablet (20 mg total) by mouth daily with breakfast. Take  60 mg for one day, then 40 mg for one day with flares. 60 tablet 5  . triamcinolone cream (KENALOG) 0.1 % Apply 1 application topically 2 (two) times daily. 30 g 0  . aspirin EC 81 MG tablet Take 1 tablet (81 mg total) by mouth daily. (Patient not taking: Reported on 05/25/2016) 90 tablet 3  . atorvastatin (LIPITOR) 40 MG tablet Take 1 tablet (40 mg total) by mouth daily. (Patient not taking: Reported on 05/25/2016) 90 tablet 3  . omeprazole (PRILOSEC) 20 MG capsule Take 1 capsule (20 mg total) by mouth daily. Reported on 04/23/2015 (Patient not taking: Reported on 07/02/2016) 30 capsule 5  . diclofenac (VOLTAREN) 75 MG EC tablet Take 1 tablet (75 mg total) by mouth 2 (two) times daily. (Patient not taking: Reported on 07/02/2016) 30 tablet 2  . diclofenac sodium (VOLTAREN) 1 % GEL Apply 2 g topically 4 (four) times daily. (Patient not taking: Reported on 07/02/2016) 1 Tube 5  . methocarbamol (ROBAXIN-750) 750 MG tablet Take 1 tablet (750 mg total) by mouth every 8 (eight) hours as needed for muscle spasms. (Patient not taking: Reported on 07/02/2016) 90 tablet 0   No  facility-administered medications prior to visit.     ROS Review of Systems Review of Systems  Constitutional: Negative for activity change and appetite change.  HENT: Negative for sinus pressure and sore throat.   Respiratory: Negative for chest tightness, shortness of breath and wheezing.   Cardiovascular: Negative for chest pain and palpitations.  Gastrointestinal: Negative for abdominal distention, abdominal pain and constipation.  Genitourinary: Negative.   Musculoskeletal:       See hpi  Skin: no rash Psychiatric/Behavioral: Negative for behavioral problems and dysphoric mood.  Objective:  BP (!) 164/84 (BP Location: Right Arm, Patient Position: Standing, Cuff Size: Small)   Pulse 87   Temp 98.6 F  (37 C) (Oral)   Ht 6' (1.829 m)   Wt 212 lb 6.4 oz (96.3 kg)   SpO2 98%   BMI 28.81 kg/m   BP/Weight 07/02/2016 06/16/2016 0/62/6948  Systolic BP 546 270 350  Diastolic BP 84 90 89  Wt. (Lbs) 212.4 216.38 212.6  BMI 28.81 30.18 29.04      Physical Exam Constitutional: He is oriented to person, place, and time. He appears well-developed and well-nourished.  Cardiovascular: Normal rate, normal heart sounds and intact distal pulses.   No murmur heard. Pulmonary/Chest: Effort normal and breath sounds normal. He has no wheezes. He has no rales. He exhibits no tenderness.  Abdominal: Soft. Bowel sounds are normal. He exhibits no distension and no mass. There is no tenderness.  Musculoskeletal:  Hands -Unable to make a fist in both hands; limited range of motion on flexion and extension of both wrists.  Lumbar spine-healed vertical surgical scar; tenderness in the L3-L4 region. Negative straight leg raise bilaterally.  Neurological: He is alert and oriented to person, place, and time.    Assessment & Plan:   1. Essential hypertension Newly diagnosed We'll commence an antihypertensive given he is on an NSAID and a stimulant which could raise his blood pressure Low-sodium diet, DASH diet, exercise Amlodipine (NORVASC) 5 MG tablet; Take 1 tablet (5 mg total) by mouth daily.  Dispense: 30 tablet; Refill: 3  2. Acute midline low back pain without sciatica Advised to use knee brace in addition to NSAIDs Apply heat Switch from Robaxin to tizanidine as he complains of blurry vision with Robaxin I have informed him that imaging is not. Plan of management and treatment of acute pain This may be indicated if his negative visit but would limit this to the discretion of his PCP. He could also discuss other x-rays he would need at that visit.  Naproxen (NAPROSYN) 500 MG tablet; Take 1 tablet (500 mg total) by mouth 2 (two) times daily with a meal.  Dispense: 60 tablet; Refill: 2 - tiZANidine  (ZANAFLEX) 4 MG tablet; Take 1 tablet (4 mg total) by mouth every 8 (eight) hours as needed for muscle spasms.  Dispense: 90 tablet; Refill: 3  3.Rheumatoid arthritis Advised to call rheumatology office to follow-up on referral Continue chronic prednisone  Meds ordered this encounter  Medications  . naproxen (NAPROSYN) 500 MG tablet    Sig: Take 1 tablet (500 mg total) by mouth 2 (two) times daily with a meal.    Dispense:  60 tablet    Refill:  2  . tiZANidine (ZANAFLEX) 4 MG tablet    Sig: Take 1 tablet (4 mg total) by mouth every 8 (eight) hours as needed for muscle spasms.    Dispense:  90 tablet    Refill:  3  . amLODipine (NORVASC) 5 MG tablet  Sig: Take 1 tablet (5 mg total) by mouth daily.    Dispense:  30 tablet    Refill:  3    Follow-up: Return in about 3 weeks (around 07/23/2016) for follow up on back pain.   Arnoldo Morale MD

## 2016-07-02 NOTE — Patient Instructions (Signed)

## 2016-07-12 MED FILL — ?PREDNISONE 20 MG TABLET: 20 | 30 days supply | Qty: 60 | Fill #1

## 2016-07-22 ENCOUNTER — Telehealth: Payer: Self-pay | Admitting: Family Medicine

## 2016-07-22 DIAGNOSIS — M25532 Pain in left wrist: Principal | ICD-10-CM

## 2016-07-22 DIAGNOSIS — M25531 Pain in right wrist: Secondary | ICD-10-CM

## 2016-07-22 MED ORDER — GABAPENTIN 300 MG PO CAPS: 300.0000 mg | ORAL_CAPSULE | Freq: Two times a day (BID) | ORAL | 3 refills | Status: DC

## 2016-07-22 MED FILL — GABAPENTIN 300 MG CAPSULE: 300 | 30 days supply | Qty: 60 | Fill #0

## 2016-07-22 NOTE — Telephone Encounter (Signed)
Medication Refill: gabapentin (NEURONTIN) 300 MG capsule

## 2016-07-22 NOTE — Telephone Encounter (Signed)
Gabapentin refilled

## 2016-08-02 ENCOUNTER — Encounter: Payer: Self-pay | Admitting: Family Medicine

## 2016-08-02 ENCOUNTER — Ambulatory Visit: Payer: Self-pay | Attending: Family Medicine | Admitting: Family Medicine

## 2016-08-02 ENCOUNTER — Other Ambulatory Visit: Payer: Self-pay

## 2016-08-02 VITALS — BP 131/79 | HR 75 | Temp 98.1°F | Ht 71.0 in | Wt 215.6 lb

## 2016-08-02 DIAGNOSIS — R0789 Other chest pain: Secondary | ICD-10-CM

## 2016-08-02 DIAGNOSIS — I10 Essential (primary) hypertension: Secondary | ICD-10-CM

## 2016-08-02 DIAGNOSIS — Z7952 Long term (current) use of systemic steroids: Secondary | ICD-10-CM

## 2016-08-02 DIAGNOSIS — M069 Rheumatoid arthritis, unspecified: Secondary | ICD-10-CM

## 2016-08-02 NOTE — Assessment & Plan Note (Signed)
A: taking Norvasc 5 mg daily, BP well controlled P: Continue Norvasc 5 mg daily

## 2016-08-02 NOTE — Progress Notes (Signed)
LOGO@  Subjective:  Patient ID: Erik Shaffer, male    DOB: 05-Jun-1965  Age: 51 y.o. MRN: [id number]  CC: Follow up Back Pain   HPI Erik Shaffer is a smoker with hx of cervical disc protrusion causing spinal stenosis s/p cervical vertebral fusion,  lumbar herniated disk d/p microdiscectomy in lumbar spine and  knee arthritis he presents for   1. Chronic joint pain: reports he was diagnosed with Rheumatoid Arthritis in 2015 in [place]. He was treated by Rheumatology by Dr. Amil Amen, then Dr. Titus Mould at [facility] on methotrexate but did not tolerate it at all and had severe leukopenia and nausea and vomiting.   He is now on prednisone 20 mg daily. His symptoms are well controlled. Marland Kitchen He majority of pain is in both hands and wrist. He also has some recent low back pain. He has been prescribed gabapentin but has not yet started it. No rash or joint swelling. He has decreased range of motion in wrist and pain with light touch.   We did labs last visit. He had negative rheumatoid factor and negative uric acid level. He has seen ortho, Dr. Erlinda Hong,  for his wrist pain. He has severe DJD of the left wrist. He has seen neurology, Dr. Posey Pronto,  who suspects chronic carpal tunnel syndrome and recommended nerve conduction if symptoms worsen. Fr now he i He is awaiting rheumatology appointment at [facility] in 10/2016.   He is attempting to lose weight and has decreased his sugar intake. He reports intermittent chest pains which he describes as "an Elephant on his chest". He denies pain currently. He continues to smoke.    Past Medical History:  Diagnosis Date  . Arthritis   . Depression   . Kidney stones   . Ulcer [facility])     Past Surgical History:  Procedure Laterality Date  . HEMILAMINOTOMY LUMBAR SPINE  06/24/2010  . MICRODISCECTOMY LUMBAR     L5-S1  . SPINE SURGERY  02/2011   Dr. Sherley Bounds     Family History  Problem Relation Age of Onset  . Alcohol abuse Mother   . Cancer Father   .  Arthritis Other   . COPD Other     Lung cancer  . Hyperlipidemia Other   . Diabetes Other     Social History  Substance Use Topics  . Smoking status: Current Every Day Smoker    Packs/day: 1.00    Years: 30.00    Types: Cigarettes  . Smokeless tobacco: Never Used  . Alcohol use No     Comment: nothing in 20 plus years    ROS Review of Systems  Constitutional: Negative for chills, fatigue, fever and unexpected weight change.  Eyes: Negative for visual disturbance.  Respiratory: Negative for cough and shortness of breath.   Cardiovascular: Positive for chest pain. Negative for palpitations and leg swelling.  Gastrointestinal: Negative for abdominal pain, blood in stool, constipation, diarrhea, nausea and vomiting.  Endocrine: Negative for polydipsia, polyphagia and polyuria.  Musculoskeletal: Positive for arthralgias, back pain and gait problem. Negative for myalgias and neck pain.  Skin: Negative for rash.  Allergic/Immunologic: Negative for immunocompromised state.  Hematological: Negative for adenopathy. Does not bruise/bleed easily.  Psychiatric/Behavioral: Positive for dysphoric mood. Negative for sleep disturbance and suicidal ideas. The patient is not nervous/anxious.     Objective:   Today's Vitals: BP 131/79   Pulse 75   Temp 98.1 F (36.7 C) (Oral)   Ht 5\' 11"  (1.803 m)  Wt 215 lb 9.6 oz (97.8 kg)   SpO2 98%   BMI 30.07 kg/m  BP Readings from Last 3 Encounters:  08/02/16 131/79  07/02/16 (!) 164/84  06/16/16 140/90    Physical Exam  Constitutional: He appears well-developed and well-nourished. No distress.  HENT:  Head: Normocephalic and atraumatic.  Neck: Normal range of motion. Neck supple.  Cardiovascular: Normal rate, regular rhythm, normal heart sounds and intact distal pulses.   Pulmonary/Chest: Effort normal and breath sounds normal.  Musculoskeletal: He exhibits edema and tenderness.       Right wrist: He exhibits decreased range of motion  and tenderness. He exhibits no bony tenderness, no swelling, no effusion, no crepitus and no deformity.       Left wrist: He exhibits decreased range of motion and tenderness. He exhibits no bony tenderness, no swelling, no deformity and no laceration.       Right knee: He exhibits swelling and effusion. He exhibits normal range of motion.       Left knee: He exhibits swelling and effusion. He exhibits normal range of motion.       Right hand: He exhibits deformity and swelling.  Neurological: He is alert.  Skin: Skin is warm and dry. No rash noted. No erythema.  Psychiatric: He has a normal mood and affect.   Lab Results  Component Value Date   HGBA1C 5.7 05/10/2016   CBG 87 EKG: normal EKG, normal sinus rhythm.  Depression screen [facility] 2/9 07/02/2016 05/14/2016 05/10/2016  Decreased Interest 1 1 2   Down, Depressed, Hopeless 0 1 1  PHQ - 2 Score 1 2 3   Altered sleeping 2 0 1  Tired, decreased energy 1 1 1   Change in appetite 1 0 0  Feeling bad or failure about yourself  0 2 2  Trouble concentrating 0 0 0  Moving slowly or fidgety/restless 0 0 0  Suicidal thoughts 0 0 0  PHQ-9 Score 5 5 7    GAD 7 : Generalized Anxiety Score 07/02/2016 05/14/2016 05/10/2016 03/01/2016  Nervous, Anxious, on Edge 0 0 0 0  Control/stop worrying 0 0 0 0  Worry too much - different things 0 0 0 0  Trouble relaxing 1 1 2 3   Restless 1 1 1 3   Easily annoyed or irritable 2 0 1 1  Afraid - awful might happen 0 0 0 0  Total GAD 7 Score 4 2 4 7      Assessment & Plan:   Problem List Items Addressed This Visit      High   HTN (hypertension) (Chronic)    A: taking Norvasc 5 mg daily, BP well controlled P: Continue Norvasc 5 mg daily           Unprioritized   Current chronic use of systemic steroids   Relevant Orders   Glucose (CBG)    Other Visit Diagnoses    Chest pressure    -  Primary   Relevant Orders   EKG 12-Lead      Outpatient Encounter Prescriptions as of 08/02/2016  Medication  Sig  . amLODipine (NORVASC) 5 MG tablet Take 1 tablet (5 mg total) by mouth daily.  Marland Kitchen aspirin EC 81 MG tablet Take 1 tablet (81 mg total) by mouth daily. (Patient not taking: Reported on 05/25/2016)  . atorvastatin (LIPITOR) 40 MG tablet Take 1 tablet (40 mg total) by mouth daily. (Patient not taking: Reported on 05/25/2016)  . gabapentin (NEURONTIN) 300 MG capsule Take 1 capsule (300 mg total)  by mouth 2 (two) times daily.  . naproxen (NAPROSYN) 500 MG tablet Take 1 tablet (500 mg total) by mouth 2 (two) times daily with a meal.  . omeprazole (PRILOSEC) 20 MG capsule Take 1 capsule (20 mg total) by mouth daily. Reported on 04/23/2015 (Patient not taking: Reported on 07/02/2016)  . predniSONE (DELTASONE) 20 MG tablet Take 1 tablet (20 mg total) by mouth daily with breakfast. Take  60 mg for one day, then 40 mg for one day with flares.  Marland Kitchen tiZANidine (ZANAFLEX) 4 MG tablet Take 1 tablet (4 mg total) by mouth every 8 (eight) hours as needed for muscle spasms.  Marland Kitchen triamcinolone cream (KENALOG) 0.1 % Apply 1 application topically 2 (two) times daily.   No facility-administered encounter medications on file as of 08/02/2016.     Follow-up: Return in about 4 weeks (around 08/30/2016) for chest pressure .    Boykin Nearing MD

## 2016-08-02 NOTE — Assessment & Plan Note (Signed)
A: rheumatoid factor negative severe DJD in wrist. Symptoms controlled with prednisone 20 mg daily P: Continue prednisone Rheumatology referral in place and patient is awaiting appointment

## 2016-08-02 NOTE — Patient Instructions (Addendum)
Erik Shaffer was seen today for follow up back pain.  Diagnoses and all orders for this visit:  Current chronic use of systemic steroids -     Cancel: HgB A1c -     Glucose (CBG)  Hypertension, unspecified type  be sure to take lipitor and aspirin daily Smoking cessation support: smoking cessation hotline: 1-800-QUIT-NOW.  Smoking cessation classes are available through [facility]. Call [phone] or visit our website at [email or web address]   Return in 4 weeks for chest pressure  f/u in 3 months for arthritis, HTN, sooner if needed   Dr. Adrian Blackwater

## 2016-08-05 MED FILL — tiZANidine HCL 4 MG TABS: 4 | 30 days supply | Qty: 90 | Fill #1

## 2016-08-05 MED FILL — AMLODIPINE BESYLATE 5 MG TA: 5 | 30 days supply | Qty: 30 | Fill #1

## 2016-08-05 MED FILL — NAPROXEN 500 MG TABLET: 500 | 30 days supply | Qty: 60 | Fill #1

## 2016-08-23 ENCOUNTER — Other Ambulatory Visit: Payer: Self-pay

## 2016-08-23 DIAGNOSIS — Z720 Tobacco use: Secondary | ICD-10-CM

## 2016-08-23 DIAGNOSIS — E785 Hyperlipidemia, unspecified: Secondary | ICD-10-CM

## 2016-08-23 MED ORDER — ATORVASTATIN CALCIUM 40 MG PO TABS: 40.0000 mg | ORAL_TABLET | Freq: Every day | ORAL | 0 refills | Status: DC

## 2016-08-23 MED FILL — predniSONE 20 MG TABS: 20 | 30 days supply | Qty: 60 | Fill #2

## 2016-08-23 MED FILL — GABAPENTIN 300 MG CAPSULE: 300 | 30 days supply | Qty: 60 | Fill #1

## 2016-08-23 MED FILL — ?ATORVASTATIN 40MG TABLET: 40 | 30 days supply | Qty: 30 | Fill #0

## 2016-09-01 ENCOUNTER — Other Ambulatory Visit: Payer: Self-pay | Admitting: Family Medicine

## 2016-09-01 ENCOUNTER — Encounter: Payer: Self-pay | Admitting: Family Medicine

## 2016-09-01 DIAGNOSIS — M545 Low back pain, unspecified: Secondary | ICD-10-CM

## 2016-09-01 MED FILL — tiZANidine HCL 4 MG TABS: 4 | 30 days supply | Qty: 90 | Fill #2

## 2016-09-01 MED FILL — predniSONE 20 MG TABS: 20 | 30 days supply | Qty: 30 | Fill #3

## 2016-09-01 MED FILL — AMLODIPINE BESYLATE 5 MG TA: 5 | 30 days supply | Qty: 30 | Fill #2

## 2016-09-01 MED FILL — NAPROXEN 500 MG TABLET: 500 | 30 days supply | Qty: 60 | Fill #2

## 2016-09-01 NOTE — Progress Notes (Signed)
LOGO@  Subjective:  Patient ID: Erik Shaffer, male    DOB: 11-29-1965  Age: 51 y.o. MRN: [id number]  CC: Follow-up   HPI Erik Shaffer is a smoker with hx of cervical disc protrusion causing spinal stenosis s/p cervical vertebral fusion,  lumbar herniated disk d/p microdiscectomy in lumbar spine and  knee arthritis he presents for   1. Chest pressure: resolved. since last visit. He reports decreasing intake of red bull and doctor pepper. He takes about 6 tums per year. He is compliant with aspirin, Lipitor and Norvasc. He takes prednisone and naproxen on an empty stomach. He is currently smoking 1 PPD. He does not desire to quit.    Past Medical History:  Diagnosis Date  . Arthritis   . Depression   . Kidney stones   . Ulcer     Past Surgical History:  Procedure Laterality Date  . HEMILAMINOTOMY LUMBAR SPINE  06/24/2010  . MICRODISCECTOMY LUMBAR     L5-S1  . SPINE SURGERY  02/2011   Dr. Sherley Bounds     Family History  Problem Relation Age of Onset  . Alcohol abuse Mother   . Cancer Father   . Arthritis Other   . COPD Other        Lung cancer  . Hyperlipidemia Other   . Diabetes Other     Social History  Substance Use Topics  . Smoking status: Current Every Day Smoker    Packs/day: 1.00    Years: 30.00    Types: Cigarettes  . Smokeless tobacco: Never Used  . Alcohol use No     Comment: nothing in 20 plus years    ROS Review of Systems  Constitutional: Negative for chills, fatigue, fever and unexpected weight change.  Eyes: Negative for visual disturbance.  Respiratory: Negative for cough and shortness of breath.   Cardiovascular: Positive for chest pain. Negative for palpitations and leg swelling.  Gastrointestinal: Negative for abdominal pain, blood in stool, constipation, diarrhea, nausea and vomiting.  Endocrine: Negative for polydipsia, polyphagia and polyuria.  Musculoskeletal: Positive for arthralgias, back pain and gait problem. Negative for myalgias  and neck pain.  Skin: Negative for rash.  Allergic/Immunologic: Negative for immunocompromised state.  Hematological: Negative for adenopathy. Does not bruise/bleed easily.  Psychiatric/Behavioral: Positive for dysphoric mood. Negative for sleep disturbance and suicidal ideas. The patient is not nervous/anxious.     Objective:   Today's Vitals: There were no vitals taken for this visit. BP Readings from Last 3 Encounters:  08/02/16 131/79  07/02/16 (!) 164/84  06/16/16 140/90    Physical Exam  Constitutional: He appears well-developed and well-nourished. No distress.  HENT:  Head: Normocephalic and atraumatic.  Neck: Normal range of motion. Neck supple.  Cardiovascular: Normal rate, regular rhythm, normal heart sounds and intact distal pulses.   Pulmonary/Chest: Effort normal and breath sounds normal.  Abdominal: Soft. Bowel sounds are normal. He exhibits no distension and no mass. There is no tenderness. There is no rebound and no guarding.  Musculoskeletal: He exhibits edema and tenderness.       Right wrist: He exhibits decreased range of motion and tenderness. He exhibits no bony tenderness, no swelling, no effusion, no crepitus and no deformity.       Left wrist: He exhibits decreased range of motion and tenderness. He exhibits no bony tenderness, no swelling, no deformity and no laceration.       Right knee: He exhibits swelling and effusion. He exhibits normal range of motion.  Left knee: He exhibits swelling and effusion. He exhibits normal range of motion.       Right hand: He exhibits deformity and swelling.  Neurological: He is alert.  Skin: Skin is warm and dry. No rash noted. No erythema.  Psychiatric: He has a normal mood and affect.   Lab Results  Component Value Date   HGBA1C 5.7 05/10/2016    Depression screen [facility] 2/9 08/02/2016 07/02/2016 05/14/2016  Decreased Interest 1 1 1   Down, Depressed, Hopeless 1 0 1  PHQ - 2 Score 2 1 2   Altered sleeping 0 2 0   Tired, decreased energy 1 1 1   Change in appetite 0 1 0  Feeling bad or failure about yourself  - 0 2  Trouble concentrating 0 0 0  Moving slowly or fidgety/restless 0 0 0  Suicidal thoughts 0 0 0  PHQ-9 Score 3 5 5    GAD 7 : Generalized Anxiety Score 08/02/2016 07/02/2016 05/14/2016 05/10/2016  Nervous, Anxious, on Edge 0 0 0 0  Control/stop worrying 0 0 0 0  Worry too much - different things 0 0 0 0  Trouble relaxing 1 1 1 2   Restless 1 1 1 1   Easily annoyed or irritable 1 2 0 1  Afraid - awful might happen 0 0 0 0  Total GAD 7 Score 3 4 2 4      Assessment & Plan:   Problem List Items Addressed This Visit      High   Tobacco abuse (Chronic)   Relevant Medications   atorvastatin (LIPITOR) 40 MG tablet   Rheumatoid arthritis (HCC) (Chronic)   Relevant Medications   tiZANidine (ZANAFLEX) 4 MG tablet   predniSONE (DELTASONE) 20 MG tablet   naproxen (NAPROSYN) 500 MG tablet   HTN (hypertension) (Chronic)   Relevant Medications   amLODipine (NORVASC) 5 MG tablet   atorvastatin (LIPITOR) 40 MG tablet     Medium   Hyperlipidemia (Chronic)   Relevant Medications   amLODipine (NORVASC) 5 MG tablet   atorvastatin (LIPITOR) 40 MG tablet     Unprioritized   Bilateral wrist pain   Relevant Medications   gabapentin (NEURONTIN) 300 MG capsule    Other Visit Diagnoses    Hypertension, unspecified type    -  Primary   Relevant Medications   amLODipine (NORVASC) 5 MG tablet   atorvastatin (LIPITOR) 40 MG tablet   Chest pressure       Relevant Medications   ranitidine (ZANTAC) 300 MG tablet   Acute midline low back pain without sciatica       Relevant Medications   tiZANidine (ZANAFLEX) 4 MG tablet   predniSONE (DELTASONE) 20 MG tablet   naproxen (NAPROSYN) 500 MG tablet      Outpatient Encounter Prescriptions as of 09/02/2016  Medication Sig  . amLODipine (NORVASC) 5 MG tablet Take 1 tablet (5 mg total) by mouth daily.  Marland Kitchen aspirin EC 81 MG tablet Take 1 tablet (81 mg  total) by mouth daily.  Marland Kitchen atorvastatin (LIPITOR) 40 MG tablet Take 1 tablet (40 mg total) by mouth daily.  Marland Kitchen gabapentin (NEURONTIN) 300 MG capsule Take 1 capsule (300 mg total) by mouth 2 (two) times daily.  . naproxen (NAPROSYN) 500 MG tablet Take 1 tablet (500 mg total) by mouth 2 (two) times daily with a meal.  . predniSONE (DELTASONE) 20 MG tablet Take 1 tablet (20 mg total) by mouth daily with breakfast. Take  60 mg for one day, then 40 mg for one  day with flares.  Marland Kitchen tiZANidine (ZANAFLEX) 4 MG tablet Take 1 tablet (4 mg total) by mouth every 8 (eight) hours as needed for muscle spasms.   No facility-administered encounter medications on file as of 09/02/2016.     Follow-up: Return in about 3 months (around 12/03/2016).    Boykin Nearing MD

## 2016-09-02 ENCOUNTER — Encounter: Payer: Self-pay | Admitting: Family Medicine

## 2016-09-02 ENCOUNTER — Ambulatory Visit: Payer: Self-pay | Attending: Family Medicine | Admitting: Family Medicine

## 2016-09-02 VITALS — BP 119/75 | HR 76 | Temp 97.5°F | Wt 216.0 lb

## 2016-09-02 DIAGNOSIS — M069 Rheumatoid arthritis, unspecified: Secondary | ICD-10-CM | POA: Insufficient documentation

## 2016-09-02 DIAGNOSIS — Z79899 Other long term (current) drug therapy: Secondary | ICD-10-CM | POA: Insufficient documentation

## 2016-09-02 DIAGNOSIS — Z720 Tobacco use: Secondary | ICD-10-CM

## 2016-09-02 DIAGNOSIS — F17210 Nicotine dependence, cigarettes, uncomplicated: Secondary | ICD-10-CM | POA: Insufficient documentation

## 2016-09-02 DIAGNOSIS — Z7982 Long term (current) use of aspirin: Secondary | ICD-10-CM | POA: Insufficient documentation

## 2016-09-02 DIAGNOSIS — Z833 Family history of diabetes mellitus: Secondary | ICD-10-CM | POA: Insufficient documentation

## 2016-09-02 DIAGNOSIS — M48061 Spinal stenosis, lumbar region without neurogenic claudication: Secondary | ICD-10-CM | POA: Insufficient documentation

## 2016-09-02 DIAGNOSIS — Z809 Family history of malignant neoplasm, unspecified: Secondary | ICD-10-CM | POA: Insufficient documentation

## 2016-09-02 DIAGNOSIS — Z981 Arthrodesis status: Secondary | ICD-10-CM | POA: Insufficient documentation

## 2016-09-02 DIAGNOSIS — F329 Major depressive disorder, single episode, unspecified: Secondary | ICD-10-CM | POA: Insufficient documentation

## 2016-09-02 DIAGNOSIS — M545 Low back pain, unspecified: Secondary | ICD-10-CM

## 2016-09-02 DIAGNOSIS — E785 Hyperlipidemia, unspecified: Secondary | ICD-10-CM | POA: Insufficient documentation

## 2016-09-02 DIAGNOSIS — M25532 Pain in left wrist: Secondary | ICD-10-CM | POA: Insufficient documentation

## 2016-09-02 DIAGNOSIS — R0789 Other chest pain: Secondary | ICD-10-CM | POA: Insufficient documentation

## 2016-09-02 DIAGNOSIS — Z811 Family history of alcohol abuse and dependence: Secondary | ICD-10-CM | POA: Insufficient documentation

## 2016-09-02 DIAGNOSIS — M25531 Pain in right wrist: Secondary | ICD-10-CM | POA: Insufficient documentation

## 2016-09-02 DIAGNOSIS — Z825 Family history of asthma and other chronic lower respiratory diseases: Secondary | ICD-10-CM | POA: Insufficient documentation

## 2016-09-02 DIAGNOSIS — Z9889 Other specified postprocedural states: Secondary | ICD-10-CM | POA: Insufficient documentation

## 2016-09-02 DIAGNOSIS — Z8261 Family history of arthritis: Secondary | ICD-10-CM | POA: Insufficient documentation

## 2016-09-02 DIAGNOSIS — I10 Essential (primary) hypertension: Secondary | ICD-10-CM | POA: Insufficient documentation

## 2016-09-02 DIAGNOSIS — Z87442 Personal history of urinary calculi: Secondary | ICD-10-CM | POA: Insufficient documentation

## 2016-09-02 DIAGNOSIS — Z801 Family history of malignant neoplasm of trachea, bronchus and lung: Secondary | ICD-10-CM | POA: Insufficient documentation

## 2016-09-02 MED ORDER — PREDNISONE 20 MG PO TABS: 20.0000 mg | ORAL_TABLET | Freq: Every day | ORAL | 5 refills | Status: DC

## 2016-09-02 MED ORDER — RANITIDINE HCL 300 MG PO TABS: 300.0000 mg | ORAL_TABLET | Freq: Every day | ORAL | 5 refills | Status: DC

## 2016-09-02 MED ORDER — GABAPENTIN 300 MG PO CAPS: 300.0000 mg | ORAL_CAPSULE | Freq: Two times a day (BID) | ORAL | 3 refills | Status: DC

## 2016-09-02 MED ORDER — ATORVASTATIN CALCIUM 40 MG PO TABS: 40.0000 mg | ORAL_TABLET | Freq: Every day | ORAL | 0 refills | Status: DC

## 2016-09-02 MED ORDER — NAPROXEN 500 MG PO TABS: 500.0000 mg | ORAL_TABLET | Freq: Two times a day (BID) | ORAL | 5 refills | Status: DC

## 2016-09-02 MED ORDER — TIZANIDINE HCL 4 MG PO TABS: 4.0000 mg | ORAL_TABLET | Freq: Three times a day (TID) | ORAL | 3 refills | Status: DC | PRN

## 2016-09-02 MED ORDER — AMLODIPINE BESYLATE 5 MG PO TABS: 5.0000 mg | ORAL_TABLET | Freq: Every day | ORAL | 3 refills | Status: DC

## 2016-09-02 NOTE — Assessment & Plan Note (Signed)
Current smoker Does not desire to quit Cessation counseling provided

## 2016-09-02 NOTE — Assessment & Plan Note (Signed)
A: taking Norvasc 5 mg daily, BP well controlled P: Continue Norvasc 5 mg daily

## 2016-09-02 NOTE — Assessment & Plan Note (Signed)
Resolved by decreasing intake of caffeine and carbonated beverages Suspect GERD  Plan: Zantac since patient takes daily prednisone and naproxen

## 2016-09-02 NOTE — Assessment & Plan Note (Addendum)
A: rheumatoid factor and CCP negative severe DJD in wrist. Symptoms controlled with prednisone 20 mg daily P: Continue prednisone Rheumatology referral in place and patient is awaiting appointment

## 2016-09-02 NOTE — Patient Instructions (Addendum)
Erik Shaffer was seen today for follow-up.  Diagnoses and all orders for this visit:  Hypertension, unspecified type  Chest pressure  Essential hypertension -     amLODipine (NORVASC) 5 MG tablet; Take 1 tablet (5 mg total) by mouth daily.  Tobacco abuse -     atorvastatin (LIPITOR) 40 MG tablet; Take 1 tablet (40 mg total) by mouth daily.  Hyperlipidemia, unspecified hyperlipidemia type -     atorvastatin (LIPITOR) 40 MG tablet; Take 1 tablet (40 mg total) by mouth daily.  Bilateral wrist pain -     gabapentin (NEURONTIN) 300 MG capsule; Take 1 capsule (300 mg total) by mouth 2 (two) times daily.  Acute midline low back pain without sciatica -     tiZANidine (ZANAFLEX) 4 MG tablet; Take 1 tablet (4 mg total) by mouth every 8 (eight) hours as needed for muscle spasms. -     naproxen (NAPROSYN) 500 MG tablet; Take 1 tablet (500 mg total) by mouth 2 (two) times daily with a meal.  Rheumatoid arthritis involving multiple sites, unspecified rheumatoid factor presence (HCC) -     predniSONE (DELTASONE) 20 MG tablet; Take 1 tablet (20 mg total) by mouth daily with breakfast. Take  60 mg for one day, then 40 mg for one day with flares.  take naproxen and prednisone with food even a small snack is recommended I also recommend zantac to prevent gastritis    F/u in 3 months sooner if needed  Dr. Adrian Blackwater

## 2016-09-20 MED FILL — predniSONE 20 MG TABS: 20 | 30 days supply | Qty: 60 | Fill #3

## 2016-09-20 MED FILL — ATORVASTATIN 40 MG TABLET: 40 | 30 days supply | Qty: 30 | Fill #0

## 2016-10-18 ENCOUNTER — Other Ambulatory Visit: Payer: Self-pay | Admitting: Family Medicine

## 2016-10-18 DIAGNOSIS — E785 Hyperlipidemia, unspecified: Secondary | ICD-10-CM

## 2016-10-18 DIAGNOSIS — Z720 Tobacco use: Secondary | ICD-10-CM

## 2016-10-18 MED FILL — ?ATORVASTATIN 40MG TABLET: 40 | 30 days supply | Qty: 30 | Fill #0

## 2016-10-18 MED FILL — NAPROXEN 500 MG TABLET: 500 | 30 days supply | Qty: 60 | Fill #0

## 2016-10-18 MED FILL — ?PREDNISONE 20 MG TABLET: 20 | 30 days supply | Qty: 60 | Fill #4

## 2016-10-18 MED FILL — AMLODIPINE BESYLATE 5 MG TA: 5 | 30 days supply | Qty: 30 | Fill #0

## 2016-10-18 MED FILL — GABAPENTIN 300 MG CAPSULE: 300 | 30 days supply | Qty: 60 | Fill #0

## 2016-10-18 MED FILL — tiZANidine HCL 4 MG TABS: 4 | 30 days supply | Qty: 90 | Fill #0

## 2016-10-28 ENCOUNTER — Ambulatory Visit: Payer: Self-pay | Attending: Family Medicine | Admitting: Physician Assistant

## 2016-10-28 ENCOUNTER — Encounter: Payer: Self-pay | Admitting: Physician Assistant

## 2016-10-28 VITALS — BP 126/78 | HR 94 | Temp 98.8°F | Resp 18 | Ht 72.0 in | Wt 210.8 lb

## 2016-10-28 DIAGNOSIS — M1990 Unspecified osteoarthritis, unspecified site: Secondary | ICD-10-CM | POA: Insufficient documentation

## 2016-10-28 DIAGNOSIS — Z79899 Other long term (current) drug therapy: Secondary | ICD-10-CM | POA: Insufficient documentation

## 2016-10-28 DIAGNOSIS — E785 Hyperlipidemia, unspecified: Secondary | ICD-10-CM | POA: Insufficient documentation

## 2016-10-28 DIAGNOSIS — Z888 Allergy status to other drugs, medicaments and biological substances status: Secondary | ICD-10-CM | POA: Insufficient documentation

## 2016-10-28 DIAGNOSIS — Z87442 Personal history of urinary calculi: Secondary | ICD-10-CM | POA: Insufficient documentation

## 2016-10-28 DIAGNOSIS — R21 Rash and other nonspecific skin eruption: Secondary | ICD-10-CM | POA: Insufficient documentation

## 2016-10-28 DIAGNOSIS — Z7952 Long term (current) use of systemic steroids: Secondary | ICD-10-CM | POA: Insufficient documentation

## 2016-10-28 DIAGNOSIS — I10 Essential (primary) hypertension: Secondary | ICD-10-CM | POA: Insufficient documentation

## 2016-10-28 DIAGNOSIS — M069 Rheumatoid arthritis, unspecified: Secondary | ICD-10-CM | POA: Insufficient documentation

## 2016-10-28 MED ORDER — PREDNISONE 20 MG PO TABS: 20.0000 mg | ORAL_TABLET | Freq: Every day | ORAL | 5 refills | Status: DC

## 2016-10-28 NOTE — Progress Notes (Signed)
Erik Shaffer, is a 51 y.o. male  KDX:[id number]  ZJQ:[id number]  DOB - [birth year]  Subjective:  Chief Complaint and HPI: Erik Shaffer is a 51 y.o. male here today for rash on back and chest for ~1-2 months.  It does not itch.  It seems to be worse after he is out in the heat.  No new meds/soaps/detergents. He has been on prednisone for about 1 yr.  No f/c  ROS:   Constitutional:  No f/c, No night sweats, No unexplained weight loss. EENT:  No vision changes, No blurry vision, No hearing changes. No mouth, throat, or ear problems.  Respiratory: No cough, No SOB Cardiac: No CP, no palpitations GI:  No abd pain, No N/V/D. GU: No Urinary s/sx Musculoskeletal: No new joint pain Neuro: No headache, no dizziness, no motor weakness.  Skin: + rash Endocrine:  No polydipsia. No polyuria.  Psych: Denies SI/HI  No problems updated.  ALLERGIES: Allergies  Allergen Reactions  . Tomato   . Butrans [Buprenorphine]     fatigue    PAST MEDICAL HISTORY: Past Medical History:  Diagnosis Date  . Arthritis   . Depression   . Kidney stones   . Ulcer     MEDICATIONS AT HOME: Prior to Admission medications   Medication Sig Start Date End Date Taking? Authorizing Provider  amLODipine (NORVASC) 5 MG tablet Take 1 tablet (5 mg total) by mouth daily. 09/02/16  Yes Funches, Josalyn, MD  atorvastatin (LIPITOR) 40 MG tablet TAKE 1 TABLET BY MOUTH DAILY. 10/18/16  Yes Funches, Josalyn, MD  gabapentin (NEURONTIN) 300 MG capsule Take 1 capsule (300 mg total) by mouth 2 (two) times daily. 09/02/16  Yes Funches, Adriana Mccallum, MD  naproxen (NAPROSYN) 500 MG tablet Take 1 tablet (500 mg total) by mouth 2 (two) times daily with a meal. 09/02/16  Yes Funches, Josalyn, MD  predniSONE (DELTASONE) 20 MG tablet Take 1 tablet (20 mg total) by mouth daily with breakfast. Take  60 mg for one day, then 40 mg for one day with flares. 10/28/16  Yes Freeman Caldron M, PA-C  ranitidine (ZANTAC) 300 MG tablet Take 1 tablet (300  mg total) by mouth at bedtime. 09/02/16  Yes Funches, Josalyn, MD  tiZANidine (ZANAFLEX) 4 MG tablet Take 1 tablet (4 mg total) by mouth every 8 (eight) hours as needed for muscle spasms. 09/02/16  Yes Funches, Adriana Mccallum, MD  aspirin EC 81 MG tablet Take 1 tablet (81 mg total) by mouth daily. 03/02/16   Funches, Adriana Mccallum, MD     Objective:  EXAM:   Vitals:   10/28/16 1524  BP: 126/78  Pulse: 94  Resp: 18  Temp: 98.8 F (37.1 C)  TempSrc: Oral  SpO2: 96%  Weight: 210 lb 12.8 oz (95.6 kg)  Height: 6' (1.829 m)    General appearance : A&OX3. NAD. Non-toxic-appearing HEENT: Atraumatic and Normocephalic.  PERRLA. EOM intact.  Neck: supple, no JVD. No cervical lymphadenopathy. No thyromegaly Chest/Lungs:  Breathing-non-labored, Good air entry bilaterally, breath sounds normal without rales, rhonchi, or wheezing  CVS: S1 S2 regular, no murmurs, gallops, rubs  Extremities: Bilateral Lower Ext shows no edema, both legs are warm to touch with = pulse throughout Neurology:  CN II-XII grossly intact, Non focal.   Psych:  TP linear. J/I WNL. Normal speech. Appropriate eye contact and affect.  Skin:  Rash on back and mild/minimal on chest-occasional pustules/open comedomes.  No secondary infection or exanthem or sign of bites  Data Review Lab Results  Component  Value Date   HGBA1C 5.7 05/10/2016     Assessment & Plan   1. Rash Likely pustular acne secondary to chronic prednisone use. Can try OTC acne medication for now bc mild to moderate in nature.  2. Hypertension, unspecified type controlled - Comprehensive metabolic panel - CBC with Differential/Platelet  3. Rheumatoid arthritis involving multiple sites, unspecified rheumatoid factor presence (HCC) controlled - predniSONE (DELTASONE) 20 MG tablet; Take 1 tablet (20 mg total) by mouth daily with breakfast. Take  60 mg for one day, then 40 mg for one day with flares.  Dispense: 60 tablet; Refill: 5  4. Hyperlipidemia, unspecified  hyperlipidemia type - Lipid panel     Patient have been counseled extensively about nutrition and exercise  Return in about 3 months (around 01/28/2017) for assign new PCP.  The patient was given clear instructions to go to ER or return to medical center if symptoms don't improve, worsen or new problems develop. The patient verbalized understanding. The patient was told to call to get lab results if they haven't heard anything in the next week.     Freeman Caldron, PA-C [facility] [place], [place]   10/28/2016, 3:59 PMPatient ID: Erik Shaffer, male   DOB: 12-Nov-1965, 51 y.o.   MRN: [id number]

## 2016-10-29 LAB — CBC WITH DIFFERENTIAL/PLATELET
Basophils Absolute: 0 10*3/uL (ref 0.0–0.2)
Basos: 0 %
EOS (ABSOLUTE): 0 10*3/uL (ref 0.0–0.4)
EOS: 0 %
HEMATOCRIT: 42.9 % (ref 37.5–51.0)
HEMOGLOBIN: 14.6 g/dL (ref 13.0–17.7)
IMMATURE GRANS (ABS): 0 10*3/uL (ref 0.0–0.1)
IMMATURE GRANULOCYTES: 0 %
LYMPHS: 11 %
Lymphocytes Absolute: 1.2 10*3/uL (ref 0.7–3.1)
MCH: 30.4 pg (ref 26.6–33.0)
MCHC: 34 g/dL (ref 31.5–35.7)
MCV: 89 fL (ref 79–97)
MONOCYTES: 3 %
Monocytes Absolute: 0.3 10*3/uL (ref 0.1–0.9)
NEUTROS PCT: 86 %
Neutrophils Absolute: 8.7 10*3/uL — ABNORMAL HIGH (ref 1.4–7.0)
Platelets: 347 10*3/uL (ref 150–379)
RBC: 4.8 x10E6/uL (ref 4.14–5.80)
RDW: 15.1 % (ref 12.3–15.4)
WBC: 10.2 10*3/uL (ref 3.4–10.8)

## 2016-10-29 LAB — COMPREHENSIVE METABOLIC PANEL
A/G RATIO: 1.8 (ref 1.2–2.2)
ALT: 25 IU/L (ref 0–44)
AST: 25 IU/L (ref 0–40)
Albumin: 4.4 g/dL (ref 3.5–5.5)
Alkaline Phosphatase: 74 IU/L (ref 39–117)
BUN/Creatinine Ratio: 11 (ref 9–20)
BUN: 11 mg/dL (ref 6–24)
CHLORIDE: 104 mmol/L (ref 96–106)
CO2: 21 mmol/L (ref 20–29)
Calcium: 9.2 mg/dL (ref 8.7–10.2)
Creatinine, Ser: 1.02 mg/dL (ref 0.76–1.27)
GFR calc Af Amer: 99 mL/min/{1.73_m2} (ref >59)
GFR calc non Af Amer: 85 mL/min/{1.73_m2} (ref >59)
Globulin, Total: 2.5 g/dL (ref 1.5–4.5)
Glucose: 158 mg/dL — ABNORMAL HIGH (ref 65–99)
POTASSIUM: 4.7 mmol/L (ref 3.5–5.2)
Sodium: 141 mmol/L (ref 134–144)
Total Protein: 6.9 g/dL (ref 6.0–8.5)

## 2016-10-29 LAB — LIPID PANEL
Chol/HDL Ratio: 3 ratio (ref 0.0–5.0)
Cholesterol, Total: 153 mg/dL (ref 100–199)
HDL: 51 mg/dL (ref >39)
LDL Calculated: 84 mg/dL (ref 0–99)
Triglycerides: 89 mg/dL (ref 0–149)
VLDL Cholesterol Cal: 18 mg/dL (ref 5–40)

## 2016-11-04 ENCOUNTER — Ambulatory Visit: Payer: Self-pay | Admitting: Family Medicine

## 2016-11-11 ENCOUNTER — Telehealth: Payer: Self-pay | Admitting: *Deleted

## 2016-11-11 NOTE — Telephone Encounter (Signed)
-----   Message from [place] Donovan, [place] sent at 11/01/2016  5:55 PM EDT ----- Please have them add a hemogolbin A1C on his blood work.  If it is too late to add it, please have him come in so we can check one.  Thanks, Freeman Caldron, PA-C

## 2016-11-11 NOTE — Telephone Encounter (Signed)
Patient verified DOB Patient is aware of levels indicating an elevated blood sugar. Patient will return to pickup his medication on tomorrow and will be screened for DM at that time. No further questions.

## 2016-11-17 MED FILL — tiZANidine HCL 4 MG TABS: 4 | 30 days supply | Qty: 90 | Fill #1

## 2016-11-17 MED FILL — ?PREDNISONE 20 MG TABLET: 20 | 30 days supply | Qty: 60 | Fill #5

## 2016-11-17 MED FILL — AMLODIPINE BESYLATE 5 MG TA: 5 | 30 days supply | Qty: 30 | Fill #1

## 2016-11-17 MED FILL — NAPROXEN 500 MG TABLET: 500 | 30 days supply | Qty: 60 | Fill #1

## 2016-11-17 MED FILL — ?ATORVASTATIN 40MG TABLET: 40 | 30 days supply | Qty: 30 | Fill #1

## 2016-11-17 MED FILL — GABAPENTIN 300 MG CAPSULE: 300 | 30 days supply | Qty: 60 | Fill #1

## 2016-11-18 ENCOUNTER — Ambulatory Visit
Admission: RE | Admit: 2016-11-18 | Discharge: 2016-11-18 | Disposition: A | Discharge status: Home / Self Care | Attending: Rheumatology | Admitting: Rheumatology

## 2016-11-18 ENCOUNTER — Ambulatory Visit: Admission: RE | Admit: 2016-11-18 | Discharge: 2016-11-18 | Disposition: A | Discharge status: Home / Self Care

## 2016-11-18 DIAGNOSIS — M069 Rheumatoid arthritis, unspecified: Principal | ICD-10-CM

## 2016-11-18 DIAGNOSIS — M1990 Unspecified osteoarthritis, unspecified site: Principal | ICD-10-CM

## 2016-11-18 DIAGNOSIS — M79641 Pain in right hand: Secondary | ICD-10-CM

## 2016-11-19 MED ORDER — LEFLUNOMIDE 20 MG TABLET: 20 mg | tablet | Freq: Every day | 11 refills | 0 days | Status: AC

## 2016-11-19 MED ORDER — LEFLUNOMIDE 20 MG TABLET
ORAL_TABLET | Freq: Every day | ORAL | 11 refills | 0.00000 days | Status: CP
Start: 2016-11-19 — End: 2016-12-09

## 2016-11-23 MED FILL — LEFLUNOMIDE/20MG/TAB: LEFLUNOMIDE/20MG/TAB | 14 days supply | Qty: 14 | Fill #0

## 2016-12-07 ENCOUNTER — Ambulatory Visit: Payer: Self-pay | Admitting: Internal Medicine

## 2016-12-09 MED ORDER — LEFLUNOMIDE 20 MG TABLET
ORAL_TABLET | Freq: Every day | ORAL | 3 refills | 0.00000 days | Status: CP
Start: 2016-12-09 — End: 2017-12-09

## 2016-12-09 MED ORDER — LEFLUNOMIDE 20 MG TABLET: tablet | 3 refills | 0 days

## 2016-12-09 MED FILL — LEFLUNOMIDE/20MG/TAB: LEFLUNOMIDE/20MG/TAB | 90 days supply | Qty: 90 | Fill #1

## 2016-12-09 NOTE — Unmapped (Signed)
PAP approved for 1 year - patient started on leflunomide last week and is aware to return for labs monthly x 3 months.      Chelsea Aus

## 2016-12-24 MED FILL — tiZANidine HCL 4 MG TABS: 4 | 30 days supply | Qty: 90 | Fill #2

## 2016-12-24 MED FILL — GABAPENTIN 300 MG CAPSULE: 300 | 30 days supply | Qty: 60 | Fill #2

## 2016-12-24 MED FILL — ?PREDNISONE 20 MG TABLET: 20 | 30 days supply | Qty: 60 | Fill #0

## 2016-12-24 MED FILL — ?ATORVASTATIN 40MG TABLET: 40 | 30 days supply | Qty: 30 | Fill #2

## 2016-12-24 MED FILL — NAPROXEN 500 MG TABLET: 500 | 30 days supply | Qty: 60 | Fill #2

## 2016-12-24 MED FILL — AMLODIPINE BESYLATE 5 MG TA: 5 | 30 days supply | Qty: 30 | Fill #2

## 2017-01-26 MED FILL — GABAPENTIN 300 MG CAPSULE: 300 | 30 days supply | Qty: 60 | Fill #3

## 2017-01-26 MED FILL — tiZANidine HCL 4 MG TABS: 4 | 30 days supply | Qty: 90 | Fill #3

## 2017-01-26 MED FILL — ?PREDNISONE 20MG TABLET: 20 | 30 days supply | Qty: 60 | Fill #1

## 2017-01-26 MED FILL — AMLODIPINE BESYLATE 5 MG TA: 5 | 30 days supply | Qty: 30 | Fill #3

## 2017-01-26 MED FILL — ?ATORVASTATIN 40MG TABLET: 40 | 30 days supply | Qty: 30 | Fill #3

## 2017-01-26 MED FILL — NAPROXEN 500 MG TABLET: 500 | 30 days supply | Qty: 60 | Fill #3

## 2017-05-16 ENCOUNTER — Ambulatory Visit: Payer: Self-pay | Attending: Nurse Practitioner | Admitting: Nurse Practitioner

## 2017-05-16 ENCOUNTER — Encounter: Payer: Self-pay | Admitting: Nurse Practitioner

## 2017-05-16 VITALS — BP 164/71 | HR 96 | Temp 98.1°F | Ht 72.0 in | Wt 204.0 lb

## 2017-05-16 DIAGNOSIS — M545 Low back pain, unspecified: Secondary | ICD-10-CM

## 2017-05-16 DIAGNOSIS — M79641 Pain in right hand: Secondary | ICD-10-CM | POA: Insufficient documentation

## 2017-05-16 DIAGNOSIS — F39 Unspecified mood [affective] disorder: Secondary | ICD-10-CM

## 2017-05-16 DIAGNOSIS — M069 Rheumatoid arthritis, unspecified: Secondary | ICD-10-CM

## 2017-05-16 DIAGNOSIS — Z811 Family history of alcohol abuse and dependence: Secondary | ICD-10-CM | POA: Insufficient documentation

## 2017-05-16 DIAGNOSIS — I10 Essential (primary) hypertension: Secondary | ICD-10-CM

## 2017-05-16 DIAGNOSIS — F17200 Nicotine dependence, unspecified, uncomplicated: Secondary | ICD-10-CM

## 2017-05-16 DIAGNOSIS — M79642 Pain in left hand: Secondary | ICD-10-CM | POA: Insufficient documentation

## 2017-05-16 DIAGNOSIS — G8929 Other chronic pain: Secondary | ICD-10-CM | POA: Insufficient documentation

## 2017-05-16 DIAGNOSIS — Z981 Arthrodesis status: Secondary | ICD-10-CM | POA: Insufficient documentation

## 2017-05-16 DIAGNOSIS — Z87442 Personal history of urinary calculi: Secondary | ICD-10-CM | POA: Insufficient documentation

## 2017-05-16 DIAGNOSIS — E785 Hyperlipidemia, unspecified: Secondary | ICD-10-CM | POA: Insufficient documentation

## 2017-05-16 DIAGNOSIS — F329 Major depressive disorder, single episode, unspecified: Secondary | ICD-10-CM | POA: Insufficient documentation

## 2017-05-16 DIAGNOSIS — L989 Disorder of the skin and subcutaneous tissue, unspecified: Secondary | ICD-10-CM

## 2017-05-16 DIAGNOSIS — R21 Rash and other nonspecific skin eruption: Secondary | ICD-10-CM

## 2017-05-16 MED ORDER — NAPROXEN 500 MG PO TABS: 500.0000 mg | ORAL_TABLET | Freq: Two times a day (BID) | ORAL | 5 refills | Status: DC

## 2017-05-16 MED ORDER — TIZANIDINE HCL 4 MG PO TABS: 4.0000 mg | ORAL_TABLET | Freq: Three times a day (TID) | ORAL | 2 refills | Status: DC | PRN

## 2017-05-16 MED ORDER — LISINOPRIL 20 MG PO TABS: 10.0000 mg | ORAL_TABLET | Freq: Every day | ORAL | 2 refills | Status: DC

## 2017-05-16 MED ORDER — TRIAMCINOLONE ACETONIDE 0.025 % EX CREA: TOPICAL_CREAM | Freq: Two times a day (BID) | CUTANEOUS | 0 refills | Status: DC

## 2017-05-16 MED ORDER — SULFAMETHOXAZOLE-TRIMETHOPRIM 800-160 MG PO TABS: 1.0000 | ORAL_TABLET | Freq: Two times a day (BID) | ORAL | 0 refills | Status: AC

## 2017-05-16 MED FILL — TRIAMCINOLONE 0.025% CREAM: 0.025 | 30 days supply | Qty: 80 | Fill #0

## 2017-05-16 MED FILL — NAPROXEN 500 MG TABLET: 500 | 30 days supply | Qty: 60 | Fill #0

## 2017-05-16 MED FILL — LISINOPRIL 20 MG TAB: 20 | 30 days supply | Qty: 15 | Fill #0

## 2017-05-16 MED FILL — SULFAMETHOXAZOLE-TMP DS TAB: 800-160 | 7 days supply | Qty: 14 | Fill #0

## 2017-05-16 MED FILL — ?TIZANIDINE HCL 4MG TABLETS: 4 | 20 days supply | Qty: 60 | Fill #0

## 2017-05-16 NOTE — Progress Notes (Signed)
Assessment & Plan:  Erik Shaffer was seen today for establish care.  Diagnoses and all orders for this visit:  Essential hypertension -     lisinopril (PRINIVIL,ZESTRIL) 20 MG tablet; Take 0.5 tablets (10 mg total) by mouth daily. Continue all antihypertensives as prescribed.  Remember to bring in your blood pressure log with you for your follow up appointment.  DASH/Mediterranean Diets are healthier choices for HTN.    Rheumatoid arthritis involving multiple sites, unspecified rheumatoid factor presence ([place]) -     Ambulatory referral to Rheumatology  Acute midline low back pain without sciatica -     tiZANidine (ZANAFLEX) 4 MG tablet; Take 1 tablet (4 mg total) by mouth every 8 (eight) hours as needed for muscle spasms. -     naproxen (NAPROSYN) 500 MG tablet; Take 1 tablet (500 mg total) by mouth 2 (two) times daily with a meal.  Facial rash -     triamcinolone (KENALOG) 0.025 % cream; Apply topically 2 (two) times daily. Do not use for more than 2 days in a row.  Skin lesion of back -     sulfamethoxazole-trimethoprim (BACTRIM DS,SEPTRA DS) 800-160 MG tablet; Take 1 tablet by mouth 2 (two) times daily for 7 days. -     WOUND CULTURE  Tobacco dependence Erik Shaffer was counseled on the dangers of tobacco use, and was advised to quit. Reviewed strategies to maximize success, including removing cigarettes and smoking materials from environment, stress management and support of family/friends as well as pharmacological alternatives including: Wellbutrin, Chantix, Nicotine patch, Nicotine gum or lozenges. Smoking cessation support: smoking cessation hotline: 1-800-QUIT-NOW.  Smoking cessation classes are also available through [facility]. Call [phone] or visit our website at [email or web address]   Spent 3  minutes counseling on smoking cessation and patient is not ready to quit.  Mood disorder ([place]) Takes abilify and xanax. Managed by psychiatry.      Patient has been counseled on age-appropriate routine health concerns for screening and prevention. These are reviewed and up-to-date. Referrals have been placed accordingly. Immunizations are up-to-date or declined.    Subjective:   Chief Complaint  Patient presents with  . Establish Care    Patient is here to establish care. Patient stated he have a breakout on his face due to eatting tomatos product. Patient would like medication refills.    HPI Erik Shaffer 52 y.o. male presents to office today to establish care. He has a history of HTN, Hyperlipidemia, RA, Low back pain. His chief complaint today is in regards to a facial rash.    Rash  He has a facial rash. Per his report this is chronic due to allergies to tomatoes,  however he eats tomatoes every day and has a chronic facial rash which has worsened over the past 1.5 weeks. He was prescribed a high potency steroid cream in the past for his facial rash. Today his face is red, pruritic and burning. He also has RA and has taken prednisone for many years. His rash could possibly be related to chronic steroid use.   Essential Hypertension Initially he reports significant relief of his hypertension on amlodipine. However after a few months of taking amlodipine he started experiencing extreme "head rush" so he stopped taking norvasc several months ago. He drinks caffeine (red bulls and mountain dew) every day. Blood pressure is poorly controlled. He is not diet or exercise compliant. Will start lisinopril today. Denies chest pain, shortness of breath, palpitations, lightheadedness, dizziness, headaches  or BLE edema.  BP Readings from Last 3 Encounters:  05/16/17 (!) 164/71  10/28/16 126/78  09/02/16 119/75    Back Pain Chronic back pain. He has a history of cervical disc protrusion with spinal stenosis. S/P cervical fusion, lumbar microdiscectomy and knee OA.   RA Chronic pain in bilateral hands. He has taken methotrexate in the  past however was unable to tolerate and was then placed on chronic prednisone. According to recent notes from Rheumatology (11-19-2016) patient needed to begin taking a steroid sparing agent. He was agreeable at that time to start leflunomide. Today he is requesting that I fill PO prednisone for him. I instructed him that I did not feel comfortable prescribing prednisone based on rheumatology notes. He will need to be referred back to rheumatology. Needs an appointment with the financial representative.   Skin Lesion He has a lesion on his back that is currently draining a small amount of purulent fluid. He states it started off as a pimple and increased in size and started to become painful to touch. He could not get the lesion to drain so he took a metal object and opened the lesion himself. He is requesting that I examine the area today to see if it has healed. He denies fever.     Review of Systems  Constitutional: Negative for fever, malaise/fatigue and weight loss.  HENT: Negative.  Negative for nosebleeds.   Eyes: Negative.  Negative for blurred vision, double vision and photophobia.  Respiratory: Negative.  Negative for cough and shortness of breath.   Cardiovascular: Negative.  Negative for chest pain, palpitations and leg swelling.  Gastrointestinal: Negative.  Negative for abdominal pain, constipation, diarrhea, heartburn, nausea and vomiting.  Musculoskeletal: Positive for back pain and joint pain. Negative for myalgias.  Skin: Positive for rash.       See HPI  Neurological: Negative.  Negative for dizziness, focal weakness, seizures and headaches.  Endo/Heme/Allergies: Negative for environmental allergies.  Psychiatric/Behavioral: Negative for suicidal ideas. The patient is nervous/anxious.     Past Medical History:  Diagnosis Date  . Arthritis   . Depression   . Kidney stones   . Ulcer     Past Surgical History:  Procedure Laterality Date  . HEMILAMINOTOMY LUMBAR SPINE   06/24/2010  . MICRODISCECTOMY LUMBAR     L5-S1  . SPINE SURGERY  02/2011   Dr. Sherley Bounds     Family History  Problem Relation Age of Onset  . Alcohol abuse Mother   . Cancer Father   . Arthritis Other   . COPD Other        Lung cancer  . Hyperlipidemia Other   . Diabetes Other     Social History Reviewed with no changes to be made today.   Outpatient Medications Prior to Visit  Medication Sig Dispense Refill  . amLODipine (NORVASC) 5 MG tablet Take 1 tablet (5 mg total) by mouth daily. (Patient not taking: Reported on 05/16/2017) 30 tablet 3  . aspirin EC 81 MG tablet Take 1 tablet (81 mg total) by mouth daily. (Patient not taking: Reported on 05/16/2017) 90 tablet 3  . atorvastatin (LIPITOR) 40 MG tablet TAKE 1 TABLET BY MOUTH DAILY. (Patient not taking: Reported on 05/16/2017) 30 tablet 3  . gabapentin (NEURONTIN) 300 MG capsule Take 1 capsule (300 mg total) by mouth 2 (two) times daily. (Patient not taking: Reported on 05/16/2017) 60 capsule 3  . predniSONE (DELTASONE) 20 MG tablet Take 1 tablet (20 mg total)  by mouth daily with breakfast. Take  60 mg for one day, then 40 mg for one day with flares. (Patient not taking: Reported on 05/16/2017) 60 tablet 5  . ranitidine (ZANTAC) 300 MG tablet Take 1 tablet (300 mg total) by mouth at bedtime. (Patient not taking: Reported on 05/16/2017) 30 tablet 5  . naproxen (NAPROSYN) 500 MG tablet Take 1 tablet (500 mg total) by mouth 2 (two) times daily with a meal. (Patient not taking: Reported on 05/16/2017) 60 tablet 5  . tiZANidine (ZANAFLEX) 4 MG tablet Take 1 tablet (4 mg total) by mouth every 8 (eight) hours as needed for muscle spasms. (Patient not taking: Reported on 05/16/2017) 90 tablet 3   No facility-administered medications prior to visit.     Allergies  Allergen Reactions  . Tomato   . Butrans [Buprenorphine]     fatigue       Objective:    BP (!) 164/71 (BP Location: Left Arm, Patient Position: Sitting, Cuff Size: Normal)    Pulse 96   Temp 98.1 F (36.7 C) (Oral)   Ht 6' (1.829 m)   Wt 204 lb (92.5 kg)   SpO2 98%   BMI 27.67 kg/m  Wt Readings from Last 3 Encounters:  05/16/17 204 lb (92.5 kg)  10/28/16 210 lb 12.8 oz (95.6 kg)  09/02/16 216 lb (98 kg)    Physical Exam  Constitutional: He is oriented to person, place, and time. He appears well-developed and well-nourished. He is cooperative.  HENT:  Head: Normocephalic and atraumatic.  Cardiovascular: Normal rate, regular rhythm, normal heart sounds and intact distal pulses. Exam reveals no gallop and no friction rub.  No murmur heard. Pulmonary/Chest: Effort normal and breath sounds normal. No tachypnea. He has no decreased breath sounds. He has no rhonchi.  Abdominal: Soft. Bowel sounds are normal.  Musculoskeletal: He exhibits no edema.  Neurological: He is alert and oriented to person, place, and time. Coordination normal.  Skin: Skin is warm and dry. Lesion and rash noted. Rash is macular (erythematous facial rash.). No erythema.     Psychiatric: He has a normal mood and affect. Judgment and thought content normal. He is hyperactive. He expresses no homicidal and no suicidal ideation. He expresses no suicidal plans and no homicidal plans.  Nursing note and vitals reviewed.     Patient has been counseled extensively about nutrition and exercise as well as the importance of adherence with medications and regular follow-up. The patient was given clear instructions to go to ER or return to medical center if symptoms don't improve, worsen or new problems develop. The patient verbalized understanding.   Follow-up: Return for Needs appointment with financial representative.Gildardo Pounds, FNP-BC [facility] [place], [place]   05/18/2017, 11:36 PM

## 2017-05-16 NOTE — Patient Instructions (Addendum)
Rheumatoid Arthritis Rheumatoid arthritis (RA) is a long-term (chronic) disease. RA causes inflammation in your joints. Your joints may feel painful, stiff, swollen, warm, or tender. RA may start slowly. Usually, it affects the small joints of the hands and feet. It can also affect other parts of the body, even the heart, eyes, or lungs. Symptoms of RA often come and go. Sometimes, symptoms get worse for a while. These are called flares. There is no cure for RA, but your doctor will work with you to find the best treatment option for you. This will depend on how the disease is changing in your body. Follow these instructions at home:  Take over-the-counter and prescription medicines only as told by your doctor. Your doctor may change (adjust) your medicines every 3 months.  Start an exercise program as told by your doctor.  Rest when you have a flare.  Return to your normal activities as told by your doctor. Ask your doctor what activities are safe for you.  Keep all follow-up visits as told by your doctor. This is important. Contact a doctor if:  You have a flare.  You have a fever.  You have problems (side effects) because of your medicines. Get help right away if:  You have chest pain.  You have trouble breathing.  You have a hot, painful joint all of a sudden, and it is worse than your usual joint aches. This information is not intended to replace advice given to you by your health care provider. Make sure you discuss any questions you have with your health care provider. Document Released: 06/28/2011 Document Revised: 09/11/2015 Document Reviewed: 01/16/2015 Elsevier Interactive Patient Education  2018 Elsevier Inc.  

## 2017-05-18 ENCOUNTER — Encounter: Payer: Self-pay | Admitting: Nurse Practitioner

## 2017-05-19 ENCOUNTER — Encounter: Payer: Self-pay | Admitting: Nurse Practitioner

## 2017-05-25 ENCOUNTER — Ambulatory Visit: Payer: Self-pay

## 2017-06-01 ENCOUNTER — Encounter: Payer: Self-pay | Admitting: Pharmacist

## 2017-06-01 ENCOUNTER — Ambulatory Visit: Payer: Self-pay | Attending: Nurse Practitioner | Admitting: Pharmacist

## 2017-06-01 VITALS — BP 127/83 | HR 102

## 2017-06-01 DIAGNOSIS — I10 Essential (primary) hypertension: Secondary | ICD-10-CM | POA: Insufficient documentation

## 2017-06-01 DIAGNOSIS — Z79899 Other long term (current) drug therapy: Secondary | ICD-10-CM | POA: Insufficient documentation

## 2017-06-01 NOTE — Patient Instructions (Addendum)
Thanks for coming to see [name]  Monitor blood pressure at home or at a local pharmacy, if you have readings higher than 140/90, let us know.    DASH Eating Plan DASH stands for "Dietary Approaches to Stop Hypertension." The DASH eating plan is a healthy eating plan that has been shown to reduce high blood pressure (hypertension). It may also reduce your risk for type 2 diabetes, heart disease, and stroke. The DASH eating plan may also help with weight loss. What are tips for following this plan? General guidelines  Avoid eating more than 2,300 mg (milligrams) of salt (sodium) a day. If you have hypertension, you may need to reduce your sodium intake to 1,500 mg a day.  Limit alcohol intake to no more than 1 drink a day for nonpregnant women and 2 drinks a day for men. One drink equals 12 oz of beer, 5 oz of wine, or 1 oz of hard liquor.  Work with your health care provider to maintain a healthy body weight or to lose weight. Ask what an ideal weight is for you.  Get at least 30 minutes of exercise that causes your heart to beat faster (aerobic exercise) most days of the week. Activities may include walking, swimming, or biking.  Work with your health care provider or diet and nutrition specialist (dietitian) to adjust your eating plan to your individual calorie needs. Reading food labels  Check food labels for the amount of sodium per serving. Choose foods with less than 5 percent of the Daily Value of sodium. Generally, foods with less than 300 mg of sodium per serving fit into this eating plan.  To find whole grains, look for the word "whole" as the first word in the ingredient list. Shopping  Buy products labeled as "low-sodium" or "no salt added."  Buy fresh foods. Avoid canned foods and premade or frozen meals. Cooking  Avoid adding salt when cooking. Use salt-free seasonings or herbs instead of table salt or sea salt. Check with your health care provider or pharmacist before using  salt substitutes.  Do not fry foods. Cook foods using healthy methods such as baking, boiling, grilling, and broiling instead.  Cook with heart-healthy oils, such as olive, canola, soybean, or sunflower oil. Meal planning   Eat a balanced diet that includes: ? 5 or more servings of fruits and vegetables each day. At each meal, try to fill half of your plate with fruits and vegetables. ? Up to 6-8 servings of whole grains each day. ? Less than 6 oz of lean meat, poultry, or fish each day. A 3-oz serving of meat is about the same size as a deck of cards. One egg equals 1 oz. ? 2 servings of low-fat dairy each day. ? A serving of nuts, seeds, or beans 5 times each week. ? Heart-healthy fats. Healthy fats called Omega-3 fatty acids are found in foods such as flaxseeds and coldwater fish, like sardines, salmon, and mackerel.  Limit how much you eat of the following: ? Canned or prepackaged foods. ? Food that is high in trans fat, such as fried foods. ? Food that is high in saturated fat, such as fatty meat. ? Sweets, desserts, sugary drinks, and other foods with added sugar. ? Full-fat dairy products.  Do not salt foods before eating.  Try to eat at least 2 vegetarian meals each week.  Eat more home-cooked food and less restaurant, buffet, and fast food.  When eating at a restaurant, ask that your  food be prepared with less salt or no salt, if possible. What foods are recommended? The items listed may not be a complete list. Talk with your dietitian about what dietary choices are best for you. Grains Whole-grain or whole-wheat bread. Whole-grain or whole-wheat pasta. Brown rice. [place]. Bulgur. Whole-grain and low-sodium cereals. Pita bread. Low-fat, low-sodium crackers. Whole-wheat flour tortillas. Vegetables Fresh or frozen vegetables (raw, steamed, roasted, or grilled). Low-sodium or reduced-sodium tomato and vegetable juice. Low-sodium or reduced-sodium tomato sauce and  tomato paste. Low-sodium or reduced-sodium canned vegetables. Fruits All fresh, dried, or frozen fruit. Canned fruit in natural juice (without added sugar). Meat and other protein foods Skinless chicken or [place]. Ground chicken or [place]. Pork with fat trimmed off. Fish and seafood. Egg whites. Dried beans, peas, or lentils. Unsalted nuts, nut butters, and seeds. Unsalted canned beans. Lean cuts of beef with fat trimmed off. Low-sodium, lean deli meat. Dairy Low-fat (1%) or fat-free (skim) milk. Fat-free, low-fat, or reduced-fat cheeses. Nonfat, low-sodium ricotta or cottage cheese. Low-fat or nonfat yogurt. Low-fat, low-sodium cheese. Fats and oils Soft margarine without trans fats. Vegetable oil. Low-fat, reduced-fat, or light mayonnaise and salad dressings (reduced-sodium). Canola, safflower, olive, soybean, and sunflower oils. Avocado. Seasoning and other foods Herbs. Spices. Seasoning mixes without salt. Unsalted popcorn and pretzels. Fat-free sweets. What foods are not recommended? The items listed may not be a complete list. Talk with your dietitian about what dietary choices are best for you. Grains Baked goods made with fat, such as croissants, muffins, or some breads. Dry pasta or rice meal packs. Vegetables Creamed or fried vegetables. Vegetables in a cheese sauce. Regular canned vegetables (not low-sodium or reduced-sodium). Regular canned tomato sauce and paste (not low-sodium or reduced-sodium). Regular tomato and vegetable juice (not low-sodium or reduced-sodium). Angie Fava. Olives. Fruits Canned fruit in a light or heavy syrup. Fried fruit. Fruit in cream or butter sauce. Meat and other protein foods Fatty cuts of meat. Ribs. Fried meat. Berniece Salines. Sausage. Bologna and other processed lunch meats. Salami. Fatback. Hotdogs. Bratwurst. Salted nuts and seeds. Canned beans with added salt. Canned or smoked fish. Whole eggs or egg yolks. Chicken or [place] with skin. Dairy Whole or 2%  milk, cream, and half-and-half. Whole or full-fat cream cheese. Whole-fat or sweetened yogurt. Full-fat cheese. Nondairy creamers. Whipped toppings. Processed cheese and cheese spreads. Fats and oils Butter. Stick margarine. Lard. Shortening. Ghee. Bacon fat. Tropical oils, such as coconut, palm kernel, or palm oil. Seasoning and other foods Salted popcorn and pretzels. Onion salt, garlic salt, seasoned salt, table salt, and sea salt. Worcestershire sauce. Tartar sauce. Barbecue sauce. Teriyaki sauce. Soy sauce, including reduced-sodium. Steak sauce. Canned and packaged gravies. Fish sauce. Oyster sauce. Cocktail sauce. Horseradish that you find on the shelf. Ketchup. Mustard. Meat flavorings and tenderizers. Bouillon cubes. Hot sauce and Tabasco sauce. Premade or packaged marinades. Premade or packaged taco seasonings. Relishes. Regular salad dressings. Where to find more information:  National Heart, Lung, and [place]: https://wilson-eaton.com/  American Heart Association: www.heart.org Summary  The DASH eating plan is a healthy eating plan that has been shown to reduce high blood pressure (hypertension). It may also reduce your risk for type 2 diabetes, heart disease, and stroke.  With the DASH eating plan, you should limit salt (sodium) intake to 2,300 mg a day. If you have hypertension, you may need to reduce your sodium intake to 1,500 mg a day.  When on the DASH eating plan, aim to eat more fresh fruits and vegetables,  whole grains, lean proteins, low-fat dairy, and heart-healthy fats.  Work with your health care provider or diet and nutrition specialist (dietitian) to adjust your eating plan to your individual calorie needs. This information is not intended to replace advice given to you by your health care provider. Make sure you discuss any questions you have with your health care provider. Document Released: 03/25/2011 Document Revised: 03/29/2016 Document Reviewed:  03/29/2016 Elsevier Interactive Patient Education  [employer].

## 2017-06-01 NOTE — Progress Notes (Signed)
   S:   Patient arrives in good spirits.  Presents to the clinic for hypertension evaluation.   Patient denies adherence with medications. However he was not able to split the lisinopril so he just didn't take it and he doesn't have the amlodipine.  Current BP Medications include:  Lisinopril 20 mg daily (10 mg daily), amlodipine 5 mg daily.   He wants to know if he can take gabapentin just at night to help nerve pain when he is trying to sleep.  O:   Last 3 Office BP readings: BP Readings from Last 3 Encounters:  06/01/17 127/83  05/16/17 (!) 164/71  10/28/16 126/78    BMET    Component Value Date/Time   NA 141 10/28/2016 1552   K 4.7 10/28/2016 1552   CL 104 10/28/2016 1552   CO2 21 10/28/2016 1552   GLUCOSE 158 (H) 10/28/2016 1552   GLUCOSE 95 03/01/2016 1032   BUN 11 10/28/2016 1552   CREATININE 1.02 10/28/2016 1552   CREATININE 0.82 03/01/2016 1032   CALCIUM 9.2 10/28/2016 1552   GFRNONAA 85 10/28/2016 1552   GFRNONAA >89 03/01/2016 1032   GFRAA 99 10/28/2016 1552   GFRAA >89 03/01/2016 1032    A/P: Hypertension longstanding currently controlled off of medications. Continue to hold BP meds, patient will monitor BP out of office and let us know if he has readings >140/90. He would really like to stay off medications if possible. Information provided on DASH diet and encouraged him to cut back on caffeine intake.   Will forward note to PCP regarding gabapentin refill.  Results reviewed and written information provided.   Total time in face-to-face counseling 5 minutes.   F/U Clinic Visit with PCP in 3 months.  Patient seen with Reino Bellis, PharmD Candidate

## 2017-06-27 ENCOUNTER — Telehealth: Payer: Self-pay | Admitting: Nurse Practitioner

## 2017-06-27 ENCOUNTER — Ambulatory Visit (INDEPENDENT_AMBULATORY_CARE_PROVIDER_SITE_OTHER): Payer: Self-pay

## 2017-06-27 ENCOUNTER — Encounter (HOSPITAL_COMMUNITY): Payer: Self-pay | Admitting: Emergency Medicine

## 2017-06-27 ENCOUNTER — Ambulatory Visit (HOSPITAL_COMMUNITY)
Admission: EM | Admit: 2017-06-27 | Discharge: 2017-06-27 | Disposition: A | Discharge status: Home / Self Care | Payer: Self-pay | Attending: Family Medicine | Admitting: Family Medicine

## 2017-06-27 DIAGNOSIS — M25511 Pain in right shoulder: Secondary | ICD-10-CM

## 2017-06-27 NOTE — Telephone Encounter (Signed)
4page, paperwork received through fax 06-27-17.

## 2017-06-27 NOTE — ED Triage Notes (Signed)
Pt sts right shoulder pain x several weeks; pt denies obvious injury

## 2017-06-27 NOTE — Discharge Instructions (Signed)
Continue with your pain management regimen of medications. Range of motion exercises as provided. Heat application to shoulder muscles may help loosen shoulder. May follow with orthopedics as needed for further evaluation and treatment options.

## 2017-06-27 NOTE — ED Provider Notes (Signed)
[place]    CSN: [id number] Arrival date & time: 06/27/17  [place]     History   Chief Complaint Chief Complaint  Patient presents with  . Shoulder Pain    HPI DILLARD PASCAL is a 52 y.o. male.   Tavaughn presents with complaints of right shoulder pain which started gradually approximately 5 days ago. He states he has had similar in the past, feels like he pops it and it improves. He is right handed. Without weakness, numbness or tingling to the arm. No change in ROM. Takes prednisone as well as naproxen for RA. Had been off prednisone for a week but has restarted it. History of cervical fusion with right arm issues in the past. No new injury.    ROS per HPI.       Past Medical History:  Diagnosis Date  . Arthritis   . Depression   . Kidney stones   . Ulcer     Patient Active Problem List   Diagnosis Date Noted  . Current chronic use of systemic steroids 08/02/2016  . Essential hypertension 08/02/2016  . Chronic pain of right knee 05/25/2016  . Pain in right elbow 05/25/2016  . Bilateral wrist pain 05/10/2016  . Hyperlipidemia 03/02/2016  . Rheumatoid arthritis ([place]) 03/01/2016  . Dermatitis 03/01/2016  . Mood disorder ([place]) 03/01/2016  . Cervical vertebral fusion syndrome 06/14/2012  . ED (erectile dysfunction) 01/20/2012  . DJD (degenerative joint disease) of knee 10/13/2011  . Tobacco abuse 03/10/2011    Past Surgical History:  Procedure Laterality Date  . HEMILAMINOTOMY LUMBAR SPINE  06/24/2010  . MICRODISCECTOMY LUMBAR     L5-S1  . SPINE SURGERY  02/2011   Dr. Sherley Bounds        Home Medications    Prior to Admission medications   Medication Sig Start Date End Date Taking? Authorizing Provider  aspirin EC 81 MG tablet Take 1 tablet (81 mg total) by mouth daily. Patient not taking: Reported on 05/16/2017 03/02/16   Boykin Nearing, MD  atorvastatin (LIPITOR) 40 MG tablet TAKE 1 TABLET BY MOUTH DAILY. 10/18/16   Funches, Adriana Mccallum, MD    gabapentin (NEURONTIN) 300 MG capsule Take 1 capsule (300 mg total) by mouth 2 (two) times daily. Patient not taking: Reported on 05/16/2017 09/02/16   Boykin Nearing, MD  naproxen (NAPROSYN) 500 MG tablet Take 1 tablet (500 mg total) by mouth 2 (two) times daily with a meal. 05/16/17   Gildardo Pounds, NP  predniSONE (DELTASONE) 20 MG tablet Take 1 tablet (20 mg total) by mouth daily with breakfast. Take  60 mg for one day, then 40 mg for one day with flares. 10/28/16   Argentina Donovan, PA-C  tiZANidine (ZANAFLEX) 4 MG tablet Take 1 tablet (4 mg total) by mouth every 8 (eight) hours as needed for muscle spasms. 05/16/17   Gildardo Pounds, NP  triamcinolone (KENALOG) 0.025 % cream Apply topically 2 (two) times daily. Do not use for more than 2 days in a row. 05/16/17   Gildardo Pounds, NP    Family History Family History  Problem Relation Age of Onset  . Alcohol abuse Mother   . Cancer Father   . Arthritis Other   . COPD Other        Lung cancer  . Hyperlipidemia Other   . Diabetes Other     Social History Social History   Tobacco Use  . Smoking status: Current Every Day Smoker  Packs/day: 1.00    Years: 30.00    Pack years: 30.00    Types: Cigarettes  . Smokeless tobacco: Never Used  Substance Use Topics  . Alcohol use: No    Comment: nothing in 20 plus years  . Drug use: No     Allergies   Tomato and Butrans [buprenorphine]   Review of Systems Review of Systems   Physical Exam Triage Vital Signs ED Triage Vitals  Enc Vitals Group     BP 06/27/17 1940 140/73     Pulse Rate 06/27/17 1940 92     Resp 06/27/17 1940 18     Temp 06/27/17 1940 98.6 F (37 C)     Temp Source 06/27/17 1940 Oral     SpO2 06/27/17 1940 99 %     Weight --      Height --      Head Circumference --      Peak Flow --      Pain Score 06/27/17 1941 7     Pain Loc --      Pain Edu? --      Excl. in [place]? --    No data found.  Updated Vital Signs BP 140/73 (BP Location: Left Arm)    Pulse 92   Temp 98.6 F (37 C) (Oral)   Resp 18   SpO2 99%   Visual Acuity Right Eye Distance:   Left Eye Distance:   Bilateral Distance:    Right Eye Near:   Left Eye Near:    Bilateral Near:     Physical Exam  Constitutional: He is oriented to person, place, and time. He appears well-developed and well-nourished.  Cardiovascular: Normal rate and regular rhythm.  Pulmonary/Chest: Effort normal and breath sounds normal.  Musculoskeletal:       Right shoulder: He exhibits tenderness and pain. He exhibits normal range of motion, no bony tenderness, no swelling, no effusion, no crepitus, no deformity, no laceration, no spasm, normal pulse and normal strength.       Arms: Right trapezius with point tenderness; without bony tenderness; full ROM to right shoulder; strength equal bilaterally; sensation intact, strong and equal pulses.   Neurological: He is alert and oriented to person, place, and time.  Skin: Skin is warm and dry.     UC Treatments / Results  Labs (all labs ordered are listed, but only abnormal results are displayed) Labs Reviewed - No data to display  EKG  EKG Interpretation None       Radiology Dg Shoulder Right  Result Date: 06/27/2017 CLINICAL DATA:  Right shoulder pain, no known injury, initial encounter EXAM: RIGHT SHOULDER - 2+ VIEW COMPARISON:  None. FINDINGS: Degenerative changes of the acromioclavicular joint are noted. No acute fracture or dislocation is seen. No soft tissue abnormality is noted. The underlying bony thorax is within normal limits. IMPRESSION: No acute abnormality noted. Electronically Signed   By: Inez Catalina M.D.   On: 06/27/2017 20:11    Procedures Procedures (including critical care time)  Medications Ordered in UC Medications - No data to display   Initial Impression / Assessment and Plan / UC Course  I have reviewed the triage vital signs and the nursing notes.  Pertinent labs & imaging results that were available  during my care of the patient were reviewed by me and considered in my medical decision making (see chart for details).     Without acute findings. Reassurance provided. Full active ROM noted. To continue with regimen  as prescribed. Follow up with PCP and/or orthopedics for continued management and treatment. Patient verbalized understanding and agreeable to plan.    Final Clinical Impressions(s) / UC Diagnoses   Final diagnoses:  Right shoulder pain, unspecified chronicity    ED Discharge Orders    None       Controlled Substance Prescriptions [place] Controlled Substance Registry consulted? Not Applicable   Zigmund Gottron, NP 06/27/17 2127

## 2017-06-28 MED FILL — tiZANidine HCL 4 MG TABS: 4 | 20 days supply | Qty: 60 | Fill #1

## 2017-06-28 MED FILL — NAPROXEN 500 MG TABLET: 500 | 30 days supply | Qty: 60 | Fill #1

## 2017-07-04 ENCOUNTER — Ambulatory Visit: Payer: Self-pay | Admitting: Nurse Practitioner

## 2017-11-24 ENCOUNTER — Ambulatory Visit (INDEPENDENT_AMBULATORY_CARE_PROVIDER_SITE_OTHER): Payer: Self-pay | Admitting: Physician Assistant

## 2017-11-24 ENCOUNTER — Telehealth (INDEPENDENT_AMBULATORY_CARE_PROVIDER_SITE_OTHER): Payer: Self-pay | Admitting: Orthopaedic Surgery

## 2017-11-24 ENCOUNTER — Encounter (INDEPENDENT_AMBULATORY_CARE_PROVIDER_SITE_OTHER): Payer: Self-pay | Admitting: Physician Assistant

## 2017-11-24 DIAGNOSIS — M069 Rheumatoid arthritis, unspecified: Secondary | ICD-10-CM

## 2017-11-24 MED ORDER — NAPROXEN 500 MG PO TABS: 500.0000 mg | ORAL_TABLET | Freq: Two times a day (BID) | ORAL | 5 refills | Status: DC

## 2017-11-24 MED ORDER — METHYLPREDNISOLONE 4 MG PO TBPK: ORAL_TABLET | ORAL | 0 refills | Status: DC

## 2017-11-24 NOTE — Telephone Encounter (Signed)
Message sent in error. Appointment made with Mendel Ryder

## 2017-11-24 NOTE — Progress Notes (Signed)
Office Visit Note   Patient: Erik Shaffer           Date of Birth: 1965/05/27           MRN: [id number] Visit Date: 11/24/2017              Requested by: Gildardo Pounds, NP [place], [place] [address] PCP: Gildardo Pounds, NP   Assessment & Plan: Visit Diagnoses:  1. Rheumatoid arthritis involving multiple sites, unspecified rheumatoid factor presence ([place])     Plan: Impression is flareup of his rheumatoid arthritis.  We will call in a steroid taper.  I also called in naproxen which he will take as needed once he is finished with the steroid taper.  He has agreed to find a doctor and rheumatologist at [facility] to the arrives.  Call with concerns or questions.  Follow-Up Instructions: Return if symptoms worsen or fail to improve.   Orders:  No orders of the defined types were placed in this encounter.  Meds ordered this encounter  Medications  . methylPREDNISolone (MEDROL DOSEPAK) 4 MG TBPK tablet    Sig: Take as directed    Dispense:  21 tablet    Refill:  0  . naproxen (NAPROSYN) 500 MG tablet    Sig: Take 1 tablet (500 mg total) by mouth 2 (two) times daily with a meal.    Dispense:  30 tablet    Refill:  5      Procedures: No procedures performed   Clinical Data: No additional findings.   Subjective: Chief Complaint  Patient presents with  . Right Wrist - Pain  . Left Wrist - Pain  . Right Knee - Pain  . Left Knee - Pain    HPI patient is a pleasant 52 year old gentleman who presents to our clinic today with recurrent bilateral wrist and knee pain.  History of rheumatoid arthritis with occasional flareups.  He was referred to rheumatologist a few years back where he was started on methotrexate.  He states that this made him very sick and he decided he did not want to take "that poison anymore ".  He does note that this helped with the swelling to his wrist and knees, however.  The pain he has is to the hands is worse with any  activity.  He feels as though he is starting to get weak.  The pain he has of the knees is constant.  Worse with motion and ambulation.  He has taken NSAIDs in the past with significant relief of symptoms.  He is planning a move to [facility] in the near future.  Review of Systems as detailed in HPI.  All others reviewed and are negative.   Objective: Vital Signs: There were no vitals taken for this visit.  Physical Exam well-developed and well-nourished gentleman in no acute distress.  Alert and oriented x3.  Ortho Exam examination of both wrists reveal moderate swelling.  He is not able to make a full composite fist on the right.  He is able to make a full composite fist on the left however.  Examination of both knees reveals trace effusion.  Range of motion from 0 to 100 degrees although this is slightly painful.  Slight joint line tenderness.  Specialty Comments:  No specialty comments available.  Imaging: No new imaging   PMFS History: Patient Active Problem List   Diagnosis Date Noted  . Current chronic use of systemic steroids 08/02/2016  .  Essential hypertension 08/02/2016  . Chronic pain of right knee 05/25/2016  . Pain in right elbow 05/25/2016  . Bilateral wrist pain 05/10/2016  . Hyperlipidemia 03/02/2016  . Rheumatoid arthritis ([place]) 03/01/2016  . Dermatitis 03/01/2016  . Mood disorder ([place]) 03/01/2016  . Cervical vertebral fusion syndrome 06/14/2012  . ED (erectile dysfunction) 01/20/2012  . DJD (degenerative joint disease) of knee 10/13/2011  . Tobacco abuse 03/10/2011   Past Medical History:  Diagnosis Date  . Arthritis   . Depression   . Kidney stones   . Ulcer     Family History  Problem Relation Age of Onset  . Alcohol abuse Mother   . Cancer Father   . Arthritis Other   . COPD Other        Lung cancer  . Hyperlipidemia Other   . Diabetes Other     Past Surgical History:  Procedure Laterality Date  . HEMILAMINOTOMY LUMBAR SPINE  06/24/2010  .  MICRODISCECTOMY LUMBAR     L5-S1  . SPINE SURGERY  02/2011   Dr. Sherley Bounds    Social History   Occupational History  . Occupation: not working  Tobacco Use  . Smoking status: Current Every Day Smoker    Packs/day: 1.00    Years: 30.00    Pack years: 30.00    Types: Cigarettes  . Smokeless tobacco: Never Used  Substance and Sexual Activity  . Alcohol use: No    Comment: nothing in 20 plus years  . Drug use: No  . Sexual activity: Yes

## 2019-07-26 IMAGING — DX DG SHOULDER 2+V*R*
4 series · 4 of 4 positions shown · non-contrast
Comparison: None.

CLINICAL DATA: Right shoulder pain, no known injury, initial
encounter

EXAM:
RIGHT SHOULDER - 2+ VIEW

[shoulder ap]
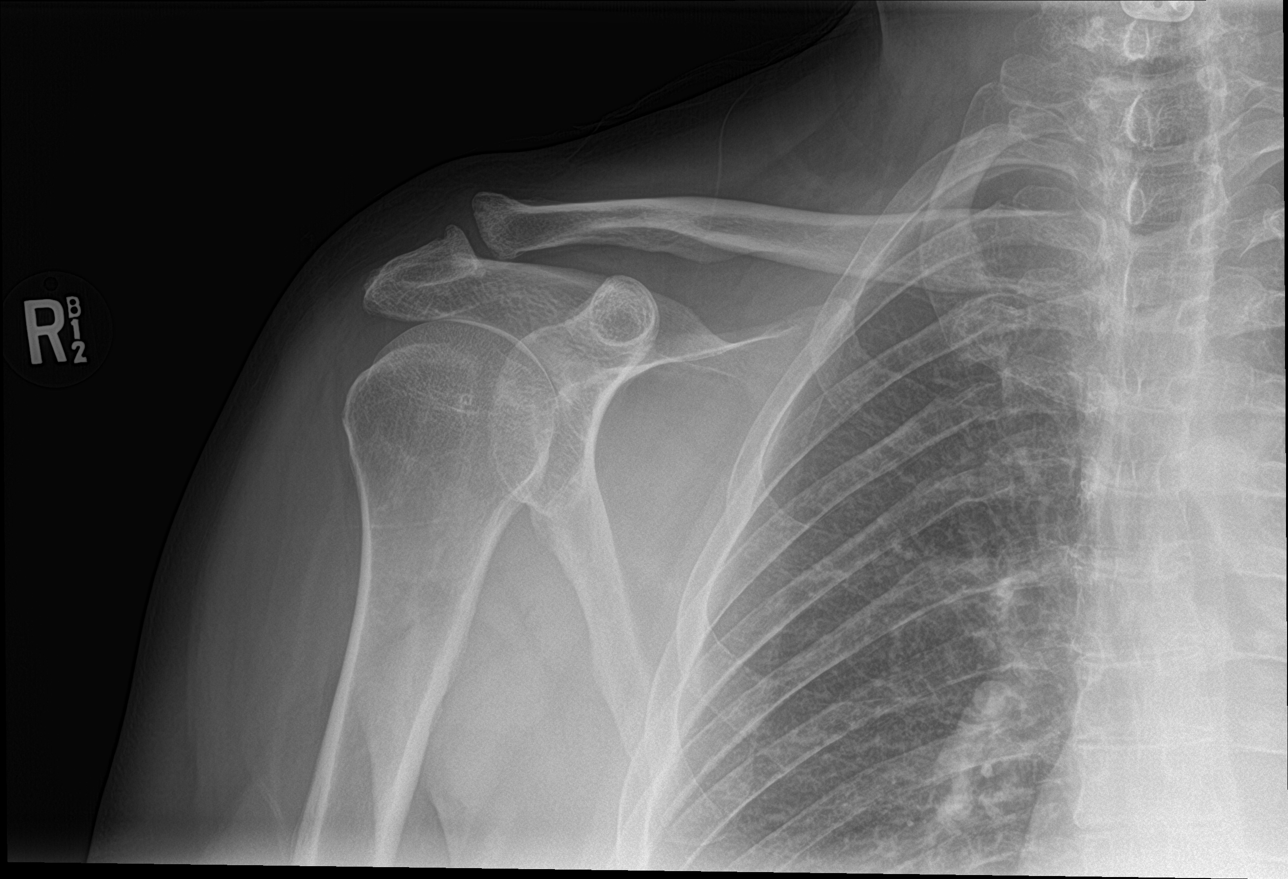

[shoulder grashey]
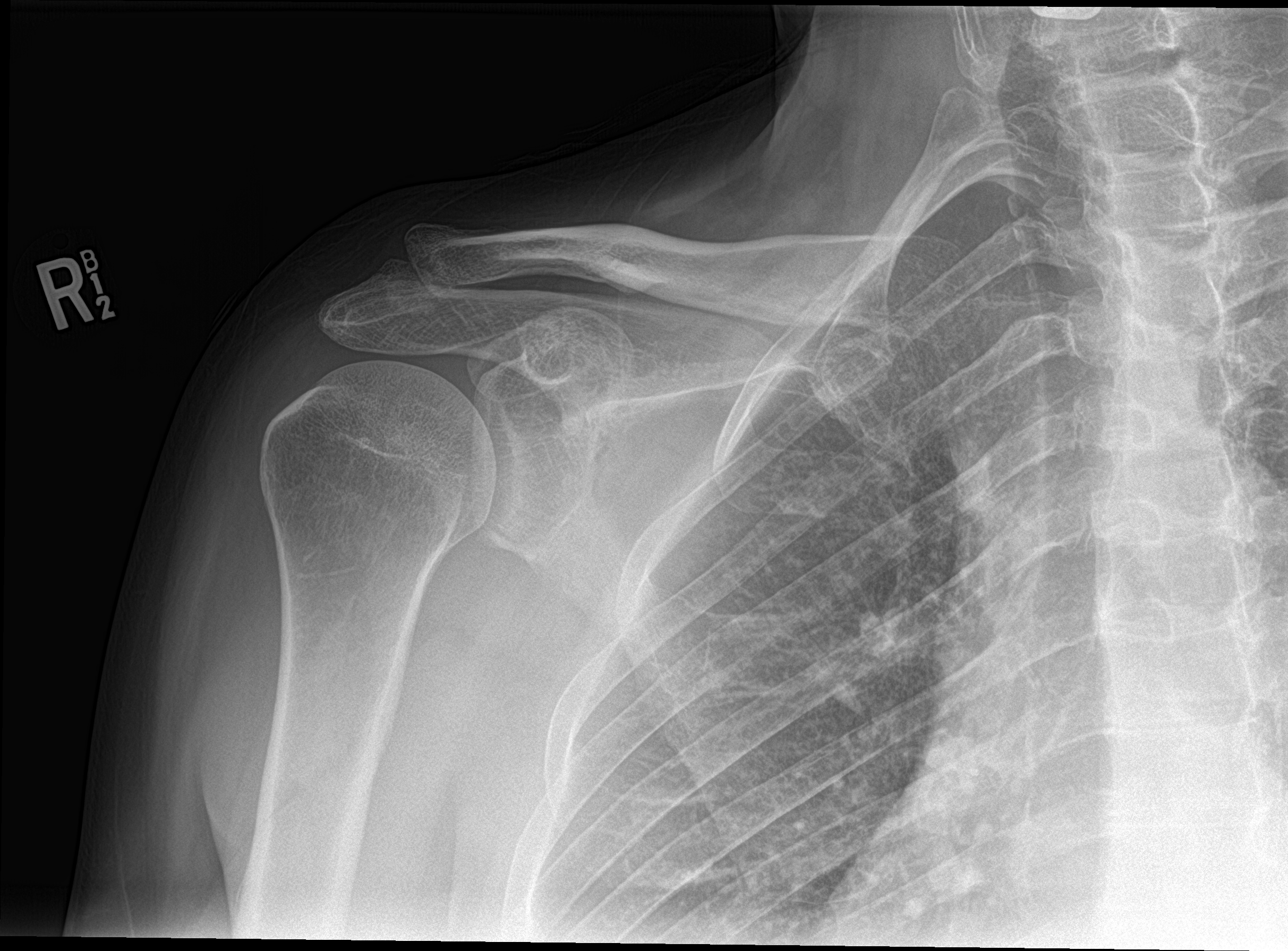

[shoulder y-view]
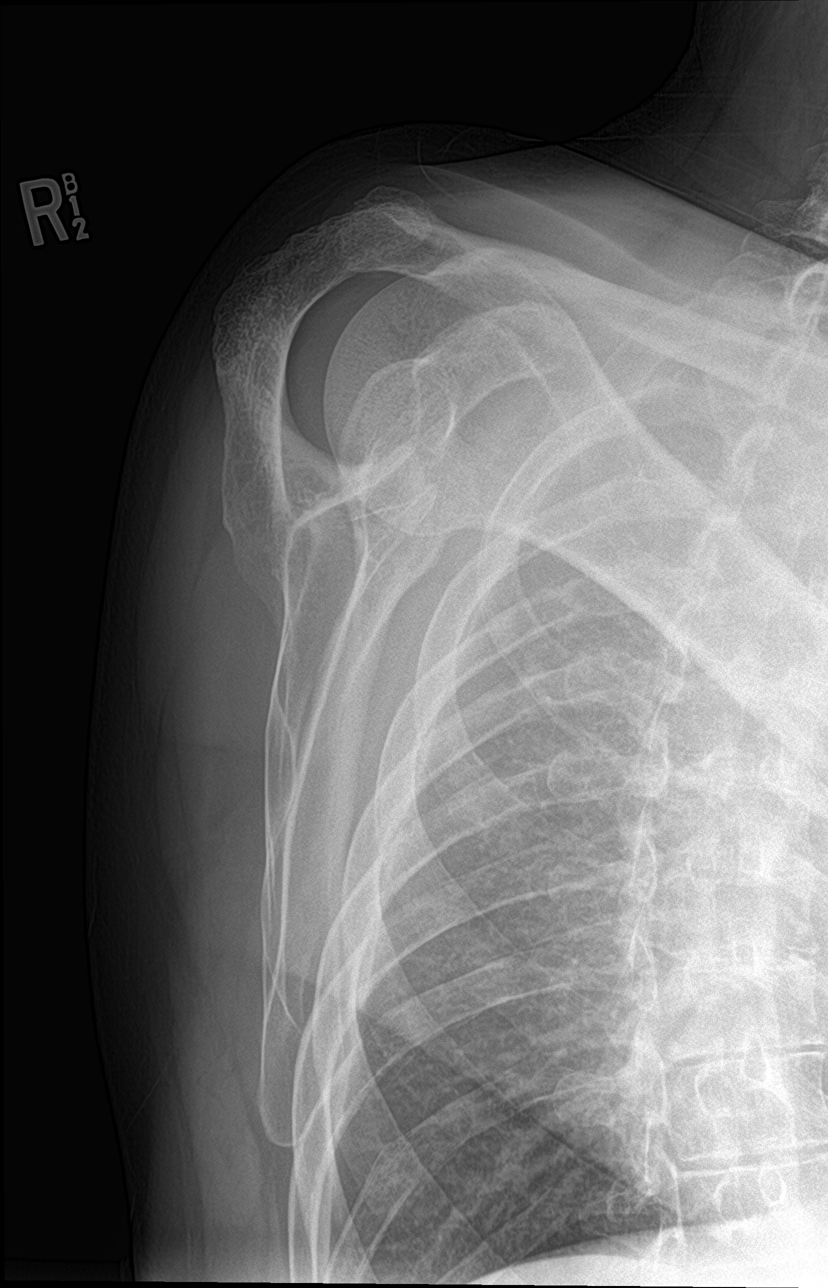

[shoulder axial]
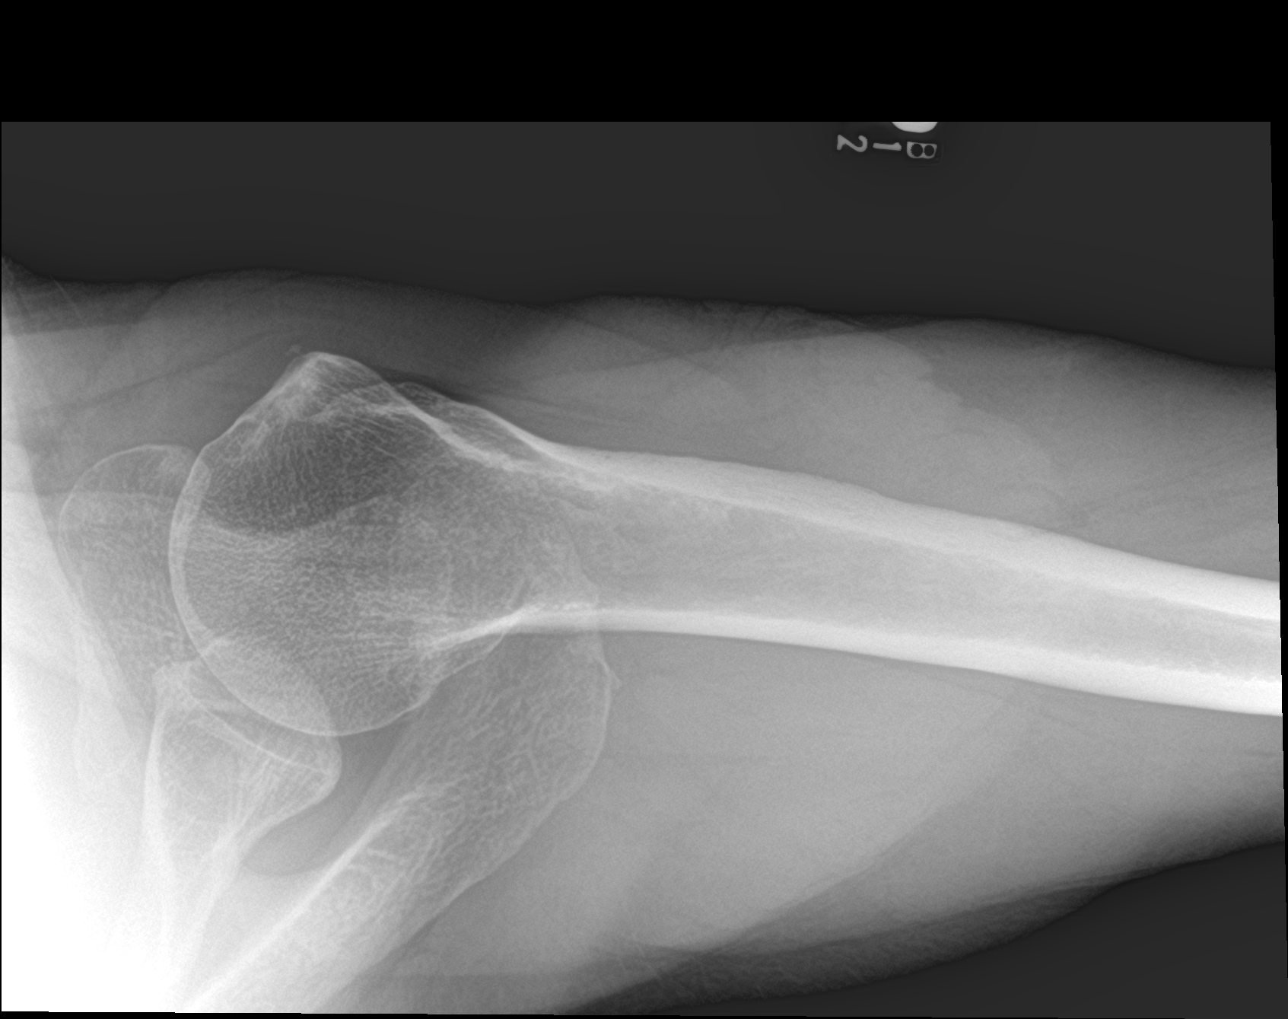

[4 of 4 positions shown; findings below may reference images not displayed]

FINDINGS: Degenerative changes of the acromioclavicular joint are noted. No
acute fracture or dislocation is seen. No soft tissue abnormality is
noted. The underlying bony thorax is within normal limits.
IMPRESSION: No acute abnormality noted.

## 2021-01-14 ENCOUNTER — Ambulatory Visit (INDEPENDENT_AMBULATORY_CARE_PROVIDER_SITE_OTHER): Payer: Medicare HMO | Admitting: Family Medicine

## 2021-01-14 ENCOUNTER — Encounter: Payer: Self-pay | Admitting: Family Medicine

## 2021-01-14 ENCOUNTER — Other Ambulatory Visit: Payer: Self-pay

## 2021-01-14 VITALS — BP 123/69 | HR 91 | Temp 98.0°F | Ht 69.0 in | Wt 152.0 lb

## 2021-01-14 DIAGNOSIS — Q761 Klippel-Feil syndrome: Secondary | ICD-10-CM | POA: Diagnosis not present

## 2021-01-14 DIAGNOSIS — D472 Monoclonal gammopathy: Secondary | ICD-10-CM | POA: Insufficient documentation

## 2021-01-14 DIAGNOSIS — D509 Iron deficiency anemia, unspecified: Secondary | ICD-10-CM | POA: Insufficient documentation

## 2021-01-14 DIAGNOSIS — F39 Unspecified mood [affective] disorder: Secondary | ICD-10-CM

## 2021-01-14 DIAGNOSIS — M0579 Rheumatoid arthritis with rheumatoid factor of multiple sites without organ or systems involvement: Secondary | ICD-10-CM | POA: Diagnosis not present

## 2021-01-14 DIAGNOSIS — G894 Chronic pain syndrome: Secondary | ICD-10-CM | POA: Diagnosis not present

## 2021-01-14 DIAGNOSIS — M174 Other bilateral secondary osteoarthritis of knee: Secondary | ICD-10-CM | POA: Diagnosis not present

## 2021-01-14 DIAGNOSIS — I10 Essential (primary) hypertension: Secondary | ICD-10-CM

## 2021-01-14 DIAGNOSIS — D500 Iron deficiency anemia secondary to blood loss (chronic): Secondary | ICD-10-CM

## 2021-01-14 DIAGNOSIS — E782 Mixed hyperlipidemia: Secondary | ICD-10-CM

## 2021-01-14 DIAGNOSIS — K219 Gastro-esophageal reflux disease without esophagitis: Secondary | ICD-10-CM

## 2021-01-14 MED ORDER — KETOROLAC TROMETHAMINE 30 MG/ML IJ SOLN
30.0000 mg | Freq: Once | INTRAMUSCULAR | Status: AC
Start: 2021-01-14 — End: 2021-01-14
  Administered 2021-01-14: 30 mg via INTRAMUSCULAR

## 2021-01-14 MED ORDER — METHYLPREDNISOLONE ACETATE 80 MG/ML IJ SUSP
80.0000 mg | Freq: Once | INTRAMUSCULAR | Status: AC
  Administered 2021-01-14: 80 mg via INTRAMUSCULAR

## 2021-01-14 MED ORDER — NAPROXEN 500 MG PO TABS: 500.0000 mg | ORAL_TABLET | Freq: Two times a day (BID) | ORAL | 5 refills | Status: DC

## 2021-01-14 NOTE — Progress Notes (Signed)
Subjective:  Patient ID: Erik Shaffer, male    DOB: 1965-09-10, 55 y.o.   MRN: [id number]  Patient Care Team: Baruch Gouty, FNP as PCP - General (Family Medicine) Latanya Maudlin, MD (Orthopedic Surgery)   Chief Complaint:  New Patient (Initial Visit) (Chronic back pain/)   HPI: Erik Shaffer is a 55 y.o. male presenting on 01/14/2021 for New Patient (Initial Visit) (Chronic back pain/)   Pt presents today to establish care with new PCP. Pt recently moved from [place] to [place] with his sister. He has a very complex medical history including rheumatoid arthritis, hypertension, hyperlipidemia, mood disorder, iron deficiency anemia, folic acid deficiency, GERD, and erectile dysfunction. Last labs noted in EHR 2018. Was seen by hem/onc 08/2020 for MGUS, awaiting appointment with hem/onc in area. He has not been to rheumatology in several years but is scheduled to see someone soon per sister. Had to stop long term prednisone due to brittle bones, no DEXA noted in EHR, prior records requested. He is a heavy everyday smoker and does not wish to quit. His biggest concern today is pain management. He was followed by pain management in [place], has not established since moving to area. He would like refills for narcotic pain medications today. Pt aware [facility] does not manage chronic pain and he would be referred to pain management. He was also made aware that controlled substances are not prescribed on initial visit. He states the pain in his back, hands, wrists, and bilateral knees is worse. He has been out of pain medications since last Thursday. No acute withdrawal symptoms.    Relevant past medical, surgical, family, and social history reviewed and updated as indicated.  Allergies and medications reviewed and updated. Data reviewed: Chart in Epic.   Past Medical History:  Diagnosis Date   Arthritis    Depression    Ulcer     Past Surgical History:  Procedure Laterality Date    HEMILAMINOTOMY LUMBAR SPINE  06/24/2010   MICRODISCECTOMY LUMBAR     L5-S1   SPINE SURGERY  02/2011   Dr. Sherley Bounds     Social History   Socioeconomic History   Marital status: Divorced    Spouse name: Not on file   Number of children: 0   Years of education: Not on file   Highest education level: Not on file  Occupational History   Occupation: not working  Tobacco Use   Smoking status: Every Day    Packs/day: 1.00    Years: 30.00    Pack years: 30.00    Types: Cigarettes   Smokeless tobacco: Never  Substance and Sexual Activity   Alcohol use: No    Comment: nothing in 20 plus years   Drug use: No   Sexual activity: Yes  Other Topics Concern   Not on file  Social History Narrative   Caffienated drinks-yes   Seat belt use often-yes   Regular Exercise-no   Smoke alarm in the home-yes   Firearms/guns in the home-yes   History of physical abuse-no   HLE-12th grade   Lives with sister and her husband in a one story home.  Has no children.  Not married. Currently trying to get disability.            Social Determinants of Health   Financial Resource Strain: Not on file  Food Insecurity: Not on file  Transportation Needs: Not on file  Physical Activity: Not on file  Stress: Not on file  Social Connections: Not on file  Intimate Partner Violence: Not on file    Outpatient Encounter Medications as of 01/14/2021  Medication Sig   gabapentin (NEURONTIN) 600 MG tablet Take 600 mg by mouth daily.   pramipexole (MIRAPEX) 1 MG tablet Take 1 mg by mouth at bedtime.   tiZANidine (ZANAFLEX) 4 MG tablet Take 1 tablet (4 mg total) by mouth every 8 (eight) hours as needed for muscle spasms.   triamcinolone (KENALOG) 0.025 % cream Apply topically 2 (two) times daily. Do not use for more than 2 days in a row.   [DISCONTINUED] gabapentin (NEURONTIN) 300 MG capsule Take 1 capsule (300 mg total) by mouth 2 (two) times daily.   [DISCONTINUED] naproxen (NAPROSYN) 500 MG tablet Take 1  tablet (500 mg total) by mouth 2 (two) times daily with a meal.   naproxen (NAPROSYN) 500 MG tablet Take 1 tablet (500 mg total) by mouth 2 (two) times daily with a meal.   [DISCONTINUED] aspirin EC 81 MG tablet Take 1 tablet (81 mg total) by mouth daily. (Patient not taking: Reported on 05/16/2017)   [DISCONTINUED] atorvastatin (LIPITOR) 40 MG tablet TAKE 1 TABLET BY MOUTH DAILY.   [DISCONTINUED] methylPREDNISolone (MEDROL DOSEPAK) 4 MG TBPK tablet Take as directed   [DISCONTINUED] naproxen (NAPROSYN) 500 MG tablet Take 1 tablet (500 mg total) by mouth 2 (two) times daily with a meal.   [DISCONTINUED] predniSONE (DELTASONE) 20 MG tablet Take 1 tablet (20 mg total) by mouth daily with breakfast. Take  60 mg for one day, then 40 mg for one day with flares.   [EXPIRED] ketorolac (TORADOL) 30 MG/ML injection 30 mg    [EXPIRED] methylPREDNISolone acetate (DEPO-MEDROL) injection 80 mg    No facility-administered encounter medications on file as of 01/14/2021.    Allergies  Allergen Reactions   Tomato    Butrans [Buprenorphine]     fatigue    Review of Systems  Constitutional:  Positive for activity change, appetite change and fatigue. Negative for chills, diaphoresis, fever and unexpected weight change.  HENT: Negative.    Eyes: Negative.  Negative for photophobia and visual disturbance.  Respiratory:  Negative for cough, chest tightness and shortness of breath.   Cardiovascular:  Negative for chest pain, palpitations and leg swelling.  Gastrointestinal:  Negative for abdominal pain, blood in stool, constipation, diarrhea, nausea and vomiting.  Endocrine: Negative.  Negative for cold intolerance, heat intolerance, polydipsia, polyphagia and polyuria.  Genitourinary:  Negative for decreased urine volume, difficulty urinating, dysuria, frequency and urgency.  Musculoskeletal:  Positive for arthralgias, back pain, gait problem, joint swelling, myalgias, neck pain and neck stiffness.  Skin:  Negative.   Allergic/Immunologic: Negative.   Neurological:  Negative for dizziness, tremors, seizures, syncope, facial asymmetry, speech difficulty, weakness, light-headedness, numbness and headaches.  Hematological: Negative.   Psychiatric/Behavioral:  Negative for confusion, hallucinations, sleep disturbance and suicidal ideas.   All other systems reviewed and are negative.      Objective:  BP 123/69   Pulse 91   Temp 98 F (36.7 C)   Ht 5\' 9"  (1.753 m)   Wt 152 lb (68.9 kg)   SpO2 98%   BMI 22.45 kg/m    Wt Readings from Last 3 Encounters:  01/14/21 152 lb (68.9 kg)  05/16/17 204 lb (92.5 kg)  10/28/16 210 lb 12.8 oz (95.6 kg)    Physical Exam Vitals and nursing note reviewed.  Constitutional:      General: He is not in acute distress.  Appearance: Normal appearance. He is well-developed, well-groomed and normal weight. He is not ill-appearing, toxic-appearing or diaphoretic.  HENT:     Head: Normocephalic and atraumatic.     Jaw: There is normal jaw occlusion.     Right Ear: Hearing normal.     Left Ear: Hearing normal.     Nose: Nose normal.     Mouth/Throat:     Lips: Pink.     Mouth: Mucous membranes are moist.     Pharynx: Oropharynx is clear. Uvula midline.  Eyes:     General: Lids are normal.     Extraocular Movements: Extraocular movements intact.     Conjunctiva/sclera: Conjunctivae normal.     Pupils: Pupils are equal, round, and reactive to light.  Neck:     Thyroid: No thyroid mass, thyromegaly or thyroid tenderness.     Vascular: No carotid bruit or JVD.     Trachea: Trachea and phonation normal.  Cardiovascular:     Rate and Rhythm: Normal rate and regular rhythm.     Chest Wall: PMI is not displaced.     Pulses: Normal pulses.     Heart sounds: Normal heart sounds. No murmur heard.   No friction rub. No gallop.  Pulmonary:     Effort: Pulmonary effort is normal. No respiratory distress.     Breath sounds: Normal breath sounds. No  wheezing.  Abdominal:     General: Bowel sounds are normal. There is no distension or abdominal bruit.     Palpations: Abdomen is soft. There is no hepatomegaly or splenomegaly.     Tenderness: There is no abdominal tenderness. There is no right CVA tenderness or left CVA tenderness.     Hernia: No hernia is present.  Musculoskeletal:        General: Deformity present.     Right wrist: Swelling present. Decreased range of motion.     Left wrist: Swelling and deformity present. Decreased range of motion.     Right hand: Swelling and deformity present. Decreased range of motion. Decreased strength.     Left hand: Swelling and deformity present. Decreased range of motion. Decreased strength.     Cervical back: Neck supple. Decreased range of motion.     Thoracic back: Decreased range of motion. Scoliosis present.     Lumbar back: Decreased range of motion. Scoliosis present.     Right upper leg: Normal.     Left upper leg: Normal.     Right knee: Swelling present. Decreased range of motion. Tenderness present.     Left knee: Swelling present. Decreased range of motion. Tenderness present.     Right lower leg: No swelling. No edema.     Left lower leg: No swelling. No edema.     Comments: Bouchards nodes noted to let and right 3rd-5th fingers with stiffness and swelling of fingers. 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 Ulnar drift noted in left wrist.  Scoliosis and kyphosis.   Lymphadenopathy:     Cervical: No cervical adenopathy.  Skin:    General: Skin is warm and dry.     Capillary Refill: Capillary refill takes less than 2 seconds.     Coloration: Skin is not cyanotic, jaundiced or pale.     Findings: No rash.  Neurological:     General: No focal deficit present.     Mental Status: He is alert and oriented to person, place, and time.     Cranial Nerves: Cranial nerves are  intact.     Sensory: Sensation is intact.     Motor: Motor function is intact.     Coordination:  Coordination is intact.     Gait: Gait abnormal (antalgic, slow, hunched over posture).     Deep Tendon Reflexes: Reflexes are normal and symmetric.  Psychiatric:        Attention and Perception: Attention and perception normal.        Mood and Affect: Mood and affect normal.        Speech: Speech normal.        Behavior: Behavior normal. Behavior is cooperative.        Thought Content: Thought content normal.        Cognition and Memory: Cognition and memory normal.        Judgment: Judgment normal.    Results for orders placed or performed in visit on 10/28/16  Comprehensive metabolic panel  Result Value Ref Range   Glucose 158 (H) 65 - 99 mg/dL   BUN 11 6 - 24 mg/dL   Creatinine, Ser 1.02 0.76 - 1.27 mg/dL   GFR calc non Af Amer 85 >59 mL/min/1.73   GFR calc Af Amer 99 >59 mL/min/1.73   BUN/Creatinine Ratio 11 9 - 20   Sodium 141 134 - 144 mmol/L   Potassium 4.7 3.5 - 5.2 mmol/L   Chloride 104 96 - 106 mmol/L   CO2 21 20 - 29 mmol/L   Calcium 9.2 8.7 - 10.2 mg/dL   Total Protein 6.9 6.0 - 8.5 g/dL   Albumin 4.4 3.5 - 5.5 g/dL   Globulin, Total 2.5 1.5 - 4.5 g/dL   Albumin/Globulin Ratio 1.8 1.2 - 2.2   Bilirubin Total <0.2 0.0 - 1.2 mg/dL   Alkaline Phosphatase 74 39 - 117 IU/L   AST 25 0 - 40 IU/L   ALT 25 0 - 44 IU/L  Lipid panel  Result Value Ref Range   Cholesterol, Total 153 100 - 199 mg/dL   Triglycerides 89 0 - 149 mg/dL   HDL 51 >39 mg/dL   VLDL Cholesterol Cal 18 5 - 40 mg/dL   LDL Calculated 84 0 - 99 mg/dL   Chol/HDL Ratio 3.0 0.0 - 5.0 ratio  CBC with Differential/Platelet  Result Value Ref Range   WBC 10.2 3.4 - 10.8 x10E3/uL   RBC 4.80 4.14 - 5.80 x10E6/uL   Hemoglobin 14.6 13.0 - 17.7 g/dL   Hematocrit 42.9 37.5 - 51.0 %   MCV 89 79 - 97 fL   MCH 30.4 26.6 - 33.0 pg   MCHC 34.0 31.5 - 35.7 g/dL   RDW 15.1 12.3 - 15.4 %   Platelets 347 150 - 379 x10E3/uL   Neutrophils 86 Not Estab. %   Lymphs 11 Not Estab. %   Monocytes 3 Not Estab. %   Eos 0  Not Estab. %   Basos 0 Not Estab. %   Neutrophils Absolute 8.7 (H) 1.4 - 7.0 x10E3/uL   Lymphocytes Absolute 1.2 0.7 - 3.1 x10E3/uL   Monocytes Absolute 0.3 0.1 - 0.9 x10E3/uL   EOS (ABSOLUTE) 0.0 0.0 - 0.4 x10E3/uL   Basophils Absolute 0.0 0.0 - 0.2 x10E3/uL   Immature Granulocytes 0 Not Estab. %   Immature Grans (Abs) 0.0 0.0 - 0.1 x10E3/uL       Pertinent labs & imaging results that were available during my care of the patient were reviewed by me and considered in my medical decision making.  Assessment & Plan:  Erik Shaffer  was seen today for new patient (initial visit).  Diagnoses and all orders for this visit:  Rheumatoid arthritis involving multiple sites with positive rheumatoid factor (HCC) Cervical vertebral fusion syndrome Chronic pain disorder Other secondary osteoarthritis of both knees Referral to pain management. Has appointment with rheumatology scheduled. Labs pending. Will give steroid and toradol injection in office today to help mitigate pain until pt can get in with pain management. Pt to continue other medications as prescribed.  -     Ambulatory referral to Pain Clinic -     CBC with Differential/Platelet -     CMP14+EGFR -     VITAMIN D 25 Hydroxy (Vit-D Deficiency, Fractures) -     methylPREDNISolone acetate (DEPO-MEDROL) injection 80 mg -     ketorolac (TORADOL) 30 MG/ML injection 30 mg -     naproxen (NAPROSYN) 500 MG tablet; Take 1 tablet (500 mg total) by mouth 2 (two) times daily with a meal.  Essential hypertension Well controlled on current regimen. DASH diet and exercise encouraged. Labs pending. -     CBC with Differential/Platelet -     CMP14+EGFR -     Lipid panel -     Thyroid Panel With TSH  Mixed hyperlipidemia Labs pending. Diet and exercise encouraged. Continue statin therapy.  -     CMP14+EGFR -     Lipid panel  Mood disorder ([place]) Will check below labs. -     Thyroid Panel With TSH -     VITAMIN D 25 Hydroxy (Vit-D Deficiency,  Fractures)  Gastroesophageal reflux disease without esophagitis Iron deficiency anemia due to chronic blood loss Was to have EGD and colonoscopy prior to moving to area, will refer to GI.  -     CBC with Differential/Platelet -     Ambulatory referral to Gastroenterology  Pt will need health maintenance updated once all health records are received. Unable to locate DEXA scan report, will need DEXA scan due to long term steroid use.   Continue all other maintenance medications.  Follow up plan: Return in about 4 weeks (around 02/11/2021), or if symptoms worsen or fail to improve.   Continue healthy lifestyle choices, including diet (rich in fruits, vegetables, and lean proteins, and low in salt and simple carbohydrates) and exercise (at least 30 minutes of moderate physical activity daily).  Educational handout given for chronic pain  The above assessment and management plan was discussed with the patient. The patient verbalized understanding of and has agreed to the management plan. Patient is aware to call the clinic if they develop any new symptoms or if symptoms persist or worsen. Patient is aware when to return to the clinic for a follow-up visit. Patient educated on when it is appropriate to go to the emergency department.   Monia Pouch, FNP-C [facility] [phone]

## 2021-01-15 ENCOUNTER — Telehealth: Payer: Self-pay | Admitting: Family Medicine

## 2021-01-15 LAB — CBC WITH DIFFERENTIAL/PLATELET
Basophils Absolute: 0.1 10*3/uL (ref 0.0–0.2)
Basos: 1 %
EOS (ABSOLUTE): 0.3 10*3/uL (ref 0.0–0.4)
Eos: 3 %
Hematocrit: 29.8 % — ABNORMAL LOW (ref 37.5–51.0)
Hemoglobin: 8.8 g/dL — CL (ref 13.0–17.7)
Immature Grans (Abs): 0 10*3/uL (ref 0.0–0.1)
Immature Granulocytes: 0 %
Lymphocytes Absolute: 3.2 10*3/uL — ABNORMAL HIGH (ref 0.7–3.1)
Lymphs: 31 %
MCH: 19.9 pg — ABNORMAL LOW (ref 26.6–33.0)
MCHC: 29.5 g/dL — ABNORMAL LOW (ref 31.5–35.7)
MCV: 67 fL — ABNORMAL LOW (ref 79–97)
Monocytes Absolute: 1.1 10*3/uL — ABNORMAL HIGH (ref 0.1–0.9)
Monocytes: 11 %
Neutrophils Absolute: 5.6 10*3/uL (ref 1.4–7.0)
Neutrophils: 54 %
Platelets: 791 10*3/uL — ABNORMAL HIGH (ref 150–450)
RBC: 4.42 x10E6/uL (ref 4.14–5.80)
RDW: 16.3 % — ABNORMAL HIGH (ref 11.6–15.4)
WBC: 10.3 10*3/uL (ref 3.4–10.8)

## 2021-01-15 LAB — CMP14+EGFR
ALT: 10 IU/L (ref 0–44)
AST: 17 IU/L (ref 0–40)
Albumin/Globulin Ratio: 0.7 — ABNORMAL LOW (ref 1.2–2.2)
Albumin: 3.4 g/dL — ABNORMAL LOW (ref 3.8–4.9)
Alkaline Phosphatase: 145 IU/L — ABNORMAL HIGH (ref 44–121)
BUN/Creatinine Ratio: 16 (ref 9–20)
BUN: 12 mg/dL (ref 6–24)
Bilirubin Total: <0.2 mg/dL (ref 0.0–1.2)
CO2: 23 mmol/L (ref 20–29)
Calcium: 9 mg/dL (ref 8.7–10.2)
Chloride: 93 mmol/L — ABNORMAL LOW (ref 96–106)
Creatinine, Ser: 0.77 mg/dL (ref 0.76–1.27)
Globulin, Total: 4.9 g/dL — ABNORMAL HIGH (ref 1.5–4.5)
Glucose: 81 mg/dL (ref 70–99)
Potassium: 4.4 mmol/L (ref 3.5–5.2)
Sodium: 131 mmol/L — ABNORMAL LOW (ref 134–144)
Total Protein: 8.3 g/dL (ref 6.0–8.5)
eGFR: 106 mL/min/{1.73_m2} (ref >59)

## 2021-01-15 LAB — THYROID PANEL WITH TSH
Free Thyroxine Index: 2.6 (ref 1.2–4.9)
T3 Uptake Ratio: 33 % (ref 24–39)
T4, Total: 7.8 ug/dL (ref 4.5–12.0)
TSH: 2.56 u[IU]/mL (ref 0.450–4.500)

## 2021-01-15 LAB — LIPID PANEL
Chol/HDL Ratio: 4.4 ratio (ref 0.0–5.0)
Cholesterol, Total: 163 mg/dL (ref 100–199)
HDL: 37 mg/dL — ABNORMAL LOW (ref >39)
LDL Chol Calc (NIH): 104 mg/dL — ABNORMAL HIGH (ref 0–99)
Triglycerides: 123 mg/dL (ref 0–149)
VLDL Cholesterol Cal: 22 mg/dL (ref 5–40)

## 2021-01-15 LAB — VITAMIN D 25 HYDROXY (VIT D DEFICIENCY, FRACTURES): Vit D, 25-Hydroxy: 23.2 ng/mL — ABNORMAL LOW (ref 30.0–100.0)

## 2021-01-15 MED ORDER — IRON (FERROUS SULFATE) 325 (65 FE) MG PO TABS: 325.0000 mg | ORAL_TABLET | Freq: Every day | ORAL | 1 refills | Status: DC

## 2021-01-15 NOTE — Telephone Encounter (Signed)
CRITICAL LAB RESULT:  Hemoglobin 8.8

## 2021-01-15 NOTE — Addendum Note (Signed)
Addended by: Baruch Gouty on: 01/15/2021 07:57 AM   Modules accepted: Orders

## 2021-01-20 ENCOUNTER — Telehealth: Payer: Self-pay | Admitting: Physician Assistant

## 2021-01-20 ENCOUNTER — Telehealth: Payer: Self-pay | Admitting: Family Medicine

## 2021-01-20 NOTE — Telephone Encounter (Signed)
Pt calling back to check on status of pain management and asking if he can be prescribed pain meds.

## 2021-01-20 NOTE — Progress Notes (Deleted)
[place] [phone]   [phone]  CONSULT NOTE  REFERRING PHYSICIAN: Dr. Thayer Ohm   REASON FOR CONSULTATION:  MGUS, thrombocytosis, and iron deficiency anemia  HPI Erik Shaffer is a 55 y.o. male with a medical history significant for rheumatoid arthritis, chronic pain, hypertension, degenerative disc disease, hyperlipidemia and ***is referred to the clinic for MGUS, thrombocytosis, and iron deficiency anemia.  The patient recently moved to the area from [facility] to [facility].  The patient's sister lives in [place] and assist him.  Patient was following with [facility] hematology oncology regarding his thrombocytosis, MGUS, and iron deficiency anemia.  He was last seen in the clinic on 09/16/2020.  The patient had several studies performed including BCR/ABL, JAK2, MPL, and CAL R testing which was all negative.  The patient had a bone marrow biopsy and aspirate performed which showed a mildly hypercellular marrow with mildly increased numbers of megakaryocytes, and mild reticulin fibrosis.  5% of the bone marrow was plasma cells.  The patient has monoclonal gammopathy with mild elevated light chain and M protein identified in immunofixation.  He appeared to have M spike's, both IgG kappa.  His findings are consistent with MGUS.  He also is taking a folic acid supplement secondary to folic acid deficiency.  He also has iron deficiency and was supposed to have an EGD and colonoscopy prior to moving.  He is currently taking oral iron supplements with ferrous sulfate 325 mg p.o. daily.  He is compliant with this.  Hematology oncology felt that his anemia and thrombocytosis was likely reactive secondary to his rheumatoid arthritis, possibly superimposed iron deficiency.  The patient was supposed to follow-up with [name] his hematologist in 1 month but he moved to the area before he could have a follow-up visit.  He then met with his new  primary care provider on 01/14/2021 to establish care.  She we referred him to a rheumatologist due to his active rheumatoid arthritis.  He previously was on methotrexate and prednisone and apparently almost died secondary to cytopenias.  She also referred the patient to [facility] for consideration of EGD and colonoscopy due to his iron deficiency anemia.  She also referred him to [name] to reestablish care regarding his lab abnormalities.  The patient CBC showed a low hemoglobin at 8.8, MCV low at 67 and normal white blood cell count of 10.3, elevated platelet count at 791.  The patient did not have any anemia in 2018.  It appears that the patient start developing anemia at least in 2020.  The patient is currently taking iron and folic acid supplements and is compliant with this.  He takes approximately ***of iron.  He denies ever needing a blood transfusion or an iron infusion before.  He denies any known abnormal bleeding or bruising including epistaxis, gingival bleeding, hemoptysis, hematemesis, melena, or hematochezia.  His last colonoscopy was ***under the care of Dr. Marland Kitchen  He denies any aspirin or blood thinner use.  He denies any particular dietary habits such as being a vegan or vegetarian.  He denies any liver disease or kidney disease to his knowledge.  He is currently still taking prednisone.  He denies any history of bariatric surgery.  Overall he has a lot of joint pain secondary to his rheumatoid arthritis.  He reports fatigue, shortness of breath with exertion, lightheadedness, dizziness.  Denies any fever, chills, night sweats, lymphadenopathy, or unexplained weight loss.  Denies any pallor.  HPI  Past Medical History:  Diagnosis Date   Arthritis    Depression    Ulcer     Past Surgical History:  Procedure Laterality Date   HEMILAMINOTOMY LUMBAR SPINE  06/24/2010   MICRODISCECTOMY LUMBAR     L5-S1   SPINE SURGERY  02/2011   Dr. Sherley Bounds     Family  History  Problem Relation Age of Onset   Alcohol abuse Mother    Cancer Father    Arthritis Other    COPD Other        Lung cancer   Hyperlipidemia Other    Diabetes Other     Social History Social History   Tobacco Use   Smoking status: Every Day    Packs/day: 1.00    Years: 30.00    Pack years: 30.00    Types: Cigarettes   Smokeless tobacco: Never  Substance Use Topics   Alcohol use: No    Comment: nothing in 20 plus years   Drug use: No    Allergies  Allergen Reactions   Tomato    Butrans [Buprenorphine]     fatigue    Current Outpatient Medications  Medication Sig Dispense Refill   gabapentin (NEURONTIN) 600 MG tablet Take 600 mg by mouth daily.     Iron, Ferrous Sulfate, 325 (65 Fe) MG TABS Take 325 mg by mouth daily. 90 tablet 1   naproxen (NAPROSYN) 500 MG tablet Take 1 tablet (500 mg total) by mouth 2 (two) times daily with a meal. 60 tablet 5   pramipexole (MIRAPEX) 1 MG tablet Take 1 mg by mouth at bedtime.     tiZANidine (ZANAFLEX) 4 MG tablet Take 1 tablet (4 mg total) by mouth every 8 (eight) hours as needed for muscle spasms. 60 tablet 2   triamcinolone (KENALOG) 0.025 % cream Apply topically 2 (two) times daily. Do not use for more than 2 days in a row. 80 g 0   No current facility-administered medications for this visit.    REVIEW OF SYSTEMS:   Review of Systems  Constitutional: Negative for appetite change, chills, fatigue, fever and unexpected weight change.  HENT:   Negative for mouth sores, nosebleeds, sore throat and trouble swallowing.   Eyes: Negative for eye problems and icterus.  Respiratory: Negative for cough, hemoptysis, shortness of breath and wheezing.   Cardiovascular: Negative for chest pain and leg swelling.  Gastrointestinal: Negative for abdominal pain, constipation, diarrhea, nausea and vomiting.  Genitourinary: Negative for bladder incontinence, difficulty urinating, dysuria, frequency and hematuria.   Musculoskeletal:  Negative for back pain, gait problem, neck pain and neck stiffness.  Skin: Negative for itching and rash.  Neurological: Negative for dizziness, extremity weakness, gait problem, headaches, light-headedness and seizures.  Hematological: Negative for adenopathy. Does not bruise/bleed easily.  Psychiatric/Behavioral: Negative for confusion, depression and sleep disturbance. The patient is not nervous/anxious.     PHYSICAL EXAMINATION:  There were no vitals taken for this visit.  ECOG PERFORMANCE STATUS: {CHL ONC ECOG [id number]  Physical Exam  Constitutional: Oriented to person, place, and time and well-developed, well-nourished, and in no distress. No distress.  HENT:  Head: Normocephalic and atraumatic.  Mouth/Throat: Oropharynx is clear and moist. No oropharyngeal exudate.  Eyes: Conjunctivae are normal. Right eye exhibits no discharge. Left eye exhibits no discharge. No scleral icterus.  Neck: Normal range of motion. Neck supple.  Cardiovascular: Normal rate, regular rhythm, normal heart sounds and intact distal pulses.   Pulmonary/Chest: Effort normal and breath sounds  normal. No respiratory distress. No wheezes. No rales.  Abdominal: Soft. Bowel sounds are normal. Exhibits no distension and no mass. There is no tenderness.  Musculoskeletal: Normal range of motion. Exhibits no edema.  Lymphadenopathy:    No cervical adenopathy.  Neurological: Alert and oriented to person, place, and time. Exhibits normal muscle tone. Gait normal. Coordination normal.  Skin: Skin is warm and dry. No rash noted. Not diaphoretic. No erythema. No pallor.  Psychiatric: Mood, memory and judgment normal.  Vitals reviewed.  LABORATORY DATA: Lab Results  Component Value Date   WBC 10.3 01/14/2021   HGB 8.8 (LL) 01/14/2021   HCT 29.8 (L) 01/14/2021   MCV 67 (L) 01/14/2021   PLT 791 (H) 01/14/2021      Chemistry      Component Value Date/Time   NA 131 (L) 01/14/2021 1525   K 4.4 01/14/2021  1525   CL 93 (L) 01/14/2021 1525   CO2 23 01/14/2021 1525   BUN 12 01/14/2021 1525   CREATININE 0.77 01/14/2021 1525   CREATININE 0.82 03/01/2016 1032      Component Value Date/Time   CALCIUM 9.0 01/14/2021 1525   ALKPHOS 145 (H) 01/14/2021 1525   AST 17 01/14/2021 1525   ALT 10 01/14/2021 1525   BILITOT <0.2 01/14/2021 1525       RADIOGRAPHIC STUDIES: No results found.  ASSESSMENT: This is a very pleasant 55 year old Caucasian male referred to the clinic for MGUS, iron deficiency anemia, and reactive thrombocytosis.  He previously had JAK2, CAL R, MLP, and BCR ABL testing performed which was negative performed in [facility].  His bone marrow biopsy and aspirate showed mild hypercellular bone marrow with mild **plasma cells 5%.   PLAN: The patient was seen with Dr. Julien Nordmann today.  The patient had a CBC, CMP, LDH, iron studies, ferritin, folic acid, vitamin J09, kappa lambda Mohamed light chain, and beta-2 microglobulin performed today, and ***.  His labs from today demonstrate***  We will arrange the patient to receive weekly IV iron infusions with Venofer 300 mg weekly x3.  Expect for him to receive his first dose of treatment next week  The patient was also encouraged to increase his dietary intake of iron.  He was given a handout of iron rich food.  He will continue taking his folic acid and iron supplement p.o. daily.  Encouraged to take this with vitamin C as it increases absorption of iron.  MGUS?  Follow-up with rheumatology and pain management regarding his joint pain as it seems to be an ongoing issue.  Follow-up?  The patient will follow-up with gastroenterology as planned for a colonoscopy and endoscopy  The patient voices understanding of current disease status and treatment options and is in agreement with the current care plan.  All questions were answered. The patient knows to call the clinic with any problems, questions or concerns. We can certainly  see the patient much sooner if necessary.  Thank you so much for allowing me to participate in the care of ALYAN HARTLINE. I will continue to follow up the patient with you and assist in his care.  I spent {CHL ONC TIME VISIT - TOIZT:[id number]} counseling the patient face to face. The total time spent in the appointment was {CHL ONC TIME VISIT - ASNKN:[id number]}.  Disclaimer: This note was dictated with voice recognition software. Similar sounding words can inadvertently be transcribed and may not be corrected upon review.   Ely Spragg L Miguel Medal January 20, 2021, 4:00 PM

## 2021-01-20 NOTE — Telephone Encounter (Signed)
Scheduled appt per 9/29 referral. Pt is aware of appt date and time.

## 2021-01-21 ENCOUNTER — Inpatient Hospital Stay: Payer: Self-pay | Admitting: Physician Assistant

## 2021-01-21 ENCOUNTER — Other Ambulatory Visit: Payer: Self-pay | Admitting: Physician Assistant

## 2021-01-21 ENCOUNTER — Inpatient Hospital Stay: Payer: Self-pay

## 2021-01-21 DIAGNOSIS — D500 Iron deficiency anemia secondary to blood loss (chronic): Secondary | ICD-10-CM

## 2021-01-21 DIAGNOSIS — D472 Monoclonal gammopathy: Secondary | ICD-10-CM

## 2021-01-21 NOTE — Telephone Encounter (Signed)
Spoke to patient. He says he spoke to referral coordinator this morning and is awaiting a call back

## 2021-01-22 DIAGNOSIS — M170 Bilateral primary osteoarthritis of knee: Secondary | ICD-10-CM | POA: Diagnosis not present

## 2021-01-22 DIAGNOSIS — M7062 Trochanteric bursitis, left hip: Secondary | ICD-10-CM | POA: Diagnosis not present

## 2021-01-22 DIAGNOSIS — M1711 Unilateral primary osteoarthritis, right knee: Secondary | ICD-10-CM | POA: Diagnosis not present

## 2021-01-22 DIAGNOSIS — M1712 Unilateral primary osteoarthritis, left knee: Secondary | ICD-10-CM | POA: Diagnosis not present

## 2021-01-22 DIAGNOSIS — M7061 Trochanteric bursitis, right hip: Secondary | ICD-10-CM | POA: Diagnosis not present

## 2021-01-23 ENCOUNTER — Telehealth: Payer: Self-pay | Admitting: Family Medicine

## 2021-01-23 NOTE — Telephone Encounter (Signed)
Pt called stating that he was supposed to have an appt with pain management facility this morning and they just called him and said that they had to cancel his appt and rescheduled it for 10/18.   Pt wants to know if Dr Thayer Ohm can see pt today and help him until he can go see pain management on the 18th?  Please advise and call patient.

## 2021-01-23 NOTE — Telephone Encounter (Signed)
Please advise if patient has an appointment with you will you be able to fill his medication until he sees pain management.

## 2021-01-23 NOTE — Progress Notes (Signed)
[place] [phone]   [phone]  CONSULT NOTE  REFERRING PHYSICIAN: Dr. Thayer Ohm   REASON FOR CONSULTATION:  MGUS, iron deficiency anemia, and reactive thrombocytosis.   HPI Erik Shaffer is a 55 y.o. male with a past medical history significant for rheumatoid arthritis, hypertension, pre-diabetes, MGUS, GERD, hyperlipidemia, tobacco abuse, and chronic pain syndrome is referred to the clinic for evaluation of MGUS, iron deficiency, and reactive thrombocytosis.   The patient recently moved to the area from [facility] to be closer to his sister due to advancement of his rheumatoid arthritis. The patient has a very complex medical history. While in [facility], the patient saw Dr. Wyn Quaker, hematologist oncologist beginning in 2020 for evaluation of anemia. He had been having mild leukocytosis and thrombocytosis since 2012 per their note. The patient's oncologist/hematologist felt that his anemia and thrombocytosis was likely reactive and secondary to his rheumatoid arthritis with superimposed iron deficiency. He had BCR/ABL, JAK2, MPL, and CALR testing which was negative.   The patient also had monoclonal gammopathy with mildly elevated light chain and M protein identified with immunofixation.  He was also found to have 2 M spike's, both IgG kappa.  He had a bone marrow biopsy and aspirate performed which showed 5% plasma cells which is consistent with MGUS.   At this time he was found to be iron deficient and Dr. Wyn Quaker recommended IV iron. The patient opted to try oral iron before IV. Dr. Wyn Quaker recommended follow-up and repeat labs in one month but the patient moved prior to this being completed. He was also scheduled for an EGD and colonoscopy in [facility] but did not have this performed prior to moving.  The patient reports that the oral iron caused him to have an intense burning sensation in his chest whenever he ate or swallowed  food and drinks. He is not currently taking any PO iron because of this. In addition to a history of iron-deficiency, patient has a history of folate deficiency for which he used to take oral supplements for. He reports he stopped taking folate at the same time he stopped PO iron due to side effects.   He recently had a follow-up appointment with his primary care provider on 01/14/2021 to establish care.  At that appointment, the patient continues to have significant pain secondary to his rheumatoid arthritis. His PCP referred him to the pain management team and rheumatology, and started the patient on prednisone which he began taking 3 days ago. The patient historically was reportedly treated with methotrexate and prednisone and "almost died due to low white blood cell count" on methotrexate.  He had a repeat CBC performed that day which showed low hemoglobin at 8.8.  His MCV was low at 67.  His platelets were elevated at 791. He was also referred to gastroenterology for consideration of EGD and colonoscopy. He has not heard from their office to schedule this at this time.   He was referred here for consideration of management and to reestablish care in the area with hematology oncology.   The patient denies any history of other vitamin or nutritional deficiencies besides folic acid and iron. He denies having any blood transfusions or IV iron infusions in the past. He denies any history of MI, stroke, any bleeding or clotting disorders. He denies a history of kidney or liver disease. He is not currently on any blood thinner. Reports taking aspirin in the past which caused him to have severe  bruising. Denies a history of abdominal procedures or bariatric surgery. He reports a long history of hemorrhoids, states that in his 34s he needed to get them lanced. He reports that he experiences bright red blood when wiping intermittently. Usually he experiences the bleeding for about 2 days, and reports this occurs a  little over once per month. He has not had the colonoscopy or EGD completed yet. He is not currently seeing a rheumatologist for RA and is not currently on any treatment other than prednisone.  The patient is currently living with his sister. He is divorced with no children. He is retired and reports he used to work in [employer], He states that he has smoked 1 ppd for the past 40 years. He denies any history of alcohol abuse, has not had alcohol since age 68. Reports history of mariajuana use but has not used since his 42s. He reports a varied diet and eats iron-rich foods.  He reports a family history of lung cancer in his father and paternal aunt. He reports that his maternal aunt had colon cancer in her late 36s.The patients older brother has a history of PAD and MI. He reports an extensive family history of diabetes and HLD in his family. His sister has a history of a stroke.  Today, he reports feeling well other than his increasing fatigue. He reports that he does not have energy during the day and he is largely sedentary at home due to fatigue and his RA. He denies any shortness of breath. Does report chronic, sharp chest pain and pressure that he reports has been there since he injured his back 1.5 years ago. He denies any lightheadedness or dizziness. Other than occasional bleeding from hemorrhoids, he denies any signs of acute bleeding including gingival bleeding, hemoptysis, melena, or easy bruising. He denies ice cravings. He does report worsening peripheral neuropathy and pain primarily in his feet. States he did not mention this at his last primary care appointment.  Denies nausea, vomiting, constipation or diarrhea. Denies fevers, chills, night sweats or unintentional weight loss.       HPI  Past Medical History:  Diagnosis Date   Arthritis    Depression    Ulcer     Past Surgical History:  Procedure Laterality Date   HEMILAMINOTOMY LUMBAR SPINE  06/24/2010    MICRODISCECTOMY LUMBAR     L5-S1   SPINE SURGERY  02/2011   Dr. Sherley Bounds     Family History  Problem Relation Age of Onset   Alcohol abuse Mother    Cancer Father    Arthritis Other    COPD Other        Lung cancer   Hyperlipidemia Other    Diabetes Other     Social History Social History   Tobacco Use   Smoking status: Every Day    Packs/day: 1.00    Years: 30.00    Pack years: 30.00    Types: Cigarettes   Smokeless tobacco: Never  Substance Use Topics   Alcohol use: No    Comment: nothing in 20 plus years   Drug use: No    Allergies  Allergen Reactions   Tomato    Butrans [Buprenorphine]     fatigue    Current Outpatient Medications  Medication Sig Dispense Refill   CALCIUM PO Take 1 tablet by mouth daily.     Cholecalciferol (VITAMIN D3 PO) Take 1 tablet by mouth daily.     gabapentin (NEURONTIN) 600 MG  tablet Take 600 mg by mouth daily.     triamcinolone (KENALOG) 0.025 % cream Apply topically 2 (two) times daily. Do not use for more than 2 days in a row. 80 g 0   folic acid (FOLVITE) 1 MG tablet Take 1 tablet (1 mg total) by mouth daily. 30 tablet 2   Iron, Ferrous Sulfate, 325 (65 Fe) MG TABS Take 325 mg by mouth daily. (Patient not taking: Reported on 01/26/2021) 90 tablet 1   naproxen (NAPROSYN) 500 MG tablet Take 1 tablet (500 mg total) by mouth 2 (two) times daily with a meal. (Patient not taking: Reported on 01/26/2021) 60 tablet 5   pramipexole (MIRAPEX) 1 MG tablet Take 1 mg by mouth at bedtime. (Patient not taking: Reported on 01/26/2021)     tiZANidine (ZANAFLEX) 4 MG tablet Take 1 tablet (4 mg total) by mouth every 8 (eight) hours as needed for muscle spasms. (Patient not taking: Reported on 01/26/2021) 60 tablet 2   No current facility-administered medications for this visit.    REVIEW OF SYSTEMS:   Review of Systems  Constitutional: Positive for fatigue. Negative for appetite change, chills, fever and unexpected weight change.  HENT:  Negative for mouth sores, nosebleeds, sore throat and trouble swallowing.   Eyes: Negative for eye problems and icterus.  Respiratory: Negative for cough, hemoptysis, shortness of breath and wheezing.   Cardiovascular: Positive for chronic chest pain. Negative for leg swelling.  Gastrointestinal: Negative for abdominal pain, constipation, diarrhea, nausea and vomiting.  Genitourinary: Negative for bladder incontinence, difficulty urinating, dysuria, frequency and hematuria.   Musculoskeletal: Positive for chronic back, neck and diffuse joint pain. Negative for neck stiffness.  Skin: Negative for itching and rash.  Neurological: Positive for occasional numbness and tingling in distal extremities. Negative for dizziness, extremity weakness, gait problem, headaches, light-headedness and seizures.  Hematological: Negative for adenopathy. Does not bruise/bleed easily.  Psychiatric/Behavioral: Negative for confusion, depression and sleep disturbance. The patient is not nervous/anxious.     PHYSICAL EXAMINATION:  Blood pressure 131/67, pulse 86, temperature 98.2 F (36.8 C), temperature source Oral, resp. rate 17, height _0  (1.753 m), weight 156 lb 9.6 oz (71 kg), SpO2 100 %.  ECOG PERFORMANCE STATUS: 1  Physical Exam  Constitutional: Oriented to person, place, and time and well-developed, well-nourished, and in no distress.  HENT:  Head: Normocephalic and atraumatic.  Mouth/Throat: Oropharynx is clear and moist. No oropharyngeal exudate.  Eyes: Conjunctivae are normal. Right eye exhibits no discharge. Left eye exhibits no discharge. No scleral icterus.  Neck: Normal range of motion. Neck supple.  Cardiovascular: Normal rate, regular rhythm, normal heart sounds and intact distal pulses.   Pulmonary/Chest: Effort normal and breath sounds normal. No respiratory distress. No wheezes. No rales.  Abdominal: Soft. Bowel sounds are normal. Exhibits no distension and no mass. There is no tenderness.   Musculoskeletal: Limited range of motion due to joint pain. Exhibits no edema.  Lymphadenopathy:    No cervical adenopathy.  Neurological: Alert and oriented to person, place, and time. Exhibits normal muscle tone. Gait normal. Coordination normal.  Skin: Skin is warm and dry. No rash noted. Not diaphoretic. No erythema. No pallor.  Psychiatric: Mood, memory and judgment normal.  Vitals reviewed.  LABORATORY DATA: Lab Results  Component Value Date   WBC 10.8 (H) 01/26/2021   HGB 10.2 (L) 01/26/2021   HCT 34.7 (L) 01/26/2021   MCV 72.3 (L) 01/26/2021   PLT 644 (H) 01/26/2021      Chemistry  Component Value Date/Time   NA 136 01/26/2021 1251   NA 131 (L) 01/14/2021 1525   K 3.7 01/26/2021 1251   CL 104 01/26/2021 1251   CO2 24 01/26/2021 1251   BUN 12 01/26/2021 1251   BUN 12 01/14/2021 1525   CREATININE 0.97 01/26/2021 1251   CREATININE 0.82 03/01/2016 1032      Component Value Date/Time   CALCIUM 8.9 01/26/2021 1251   ALKPHOS 106 01/26/2021 1251   AST 13 (L) 01/26/2021 1251   ALT 15 01/26/2021 1251   BILITOT <0.2 (L) 01/26/2021 1251       RADIOGRAPHIC STUDIES: No results found.  ASSESSMENT: This is a very pleasant 55 year old Caucasian male with MGUS, iron deficiency anemia, and reactive thrombocytosis.   PLAN: The patient was seen with Dr. Julien Nordmann today. The patient had several lab studies performed including CBC, CMP, ferritin, iron studies, B12, folate, kappa lambda light chains, beta-2 microglobulin, quantitative immunoglobulins, and LDH.   The patient's labs from today demonstrate: Hgb 10.2, WBC 10.8, MCV 72.3, Plt 644, Ferritin of 56, Iron 35, Saturation Ratio of 10%. Labs also showed low folate at 4.0 and borderline low B12 at 193.  For management of mild iron-deficiency anemia, we will arrange for the patient to receive IV iron infusions with Venofer 300 mg weekly x3.  Patient encouraged to begin taking OTC B12 supplements for borderline B12  deficiency. Will send in new prescription for folate supplements as folate was also low from today's labs.  For MGUS, we will repeat myeloma panel every 6 months and will continue to monitor.  We will see him back for follow-up visit in 3 months for evaluation and repeat CBC, iron studies, ferritin, B12 and folate levels.  He was given a handout on iron rich diet.  He was encouraged to continue to take his oral iron supplement.   The patient is encouraged to follow-up with GI for colonoscopy and endoscopy to assess for any underlying sources of bleeding.   Recommended following up with rheumatology for worsening symptoms of rheumatoid arthritis. Also encouraged the patient to f/u with primary care for worsening peripheral neuropathy.  The patient voices understanding of current disease status and treatment options and is in agreement with the current care plan.  All questions were answered. The patient knows to call the clinic with any problems, questions or concerns. We can certainly see the patient much sooner if necessary.  Thank you so much for allowing me to participate in the care of Erik Shaffer. I will continue to follow up the patient with you and assist in his care.   Disclaimer: This note was dictated with voice recognition software. Similar sounding words can inadvertently be transcribed and may not be corrected upon review.   Mckade Gurka L Dennisse Swader January 26, 2021, 3:40 PM  ADDENDUM: Hematology/Oncology Attending: I had a face-to-face encounter with the patient today.  I reviewed his record, lab and recommended his care plan.  This is a 55 years old white male with past medical history significant for rheumatoid arthritis, prediabetics, hypertension, dyslipidemia, GERD as well as chronic pain.  He was seen by a hematologist Dr. Wyn Quaker at [facility] for evaluation of leukocytosis and thrombocytosis at that time.  During his evaluation he had JAK2 mutation panel  that was negative.  He was also found to have MGUS and a bone marrow biopsy and aspirate at that time showed 5% plasma cells. The patient was seen recently by her primary care provider in [place] and  he was noted to have significant anemia. He was referred to gastroenterology and expected to have EGD and colonoscopy soon. He was also found to have significant iron deficiency and he was referred to [name] for evaluation and consideration of IV iron infusion. The patient started oral iron tablet but no improvement in his condition. I recommended for him to proceed with iron infusion with Venofer 300 mg IV weekly for 3 weeks. I strongly recommend for him to keep his appointment with gastroenterology to rule out any gastrointestinal blood loss. We will see him back for follow-up visit in 3 months for evaluation and repeat CBC, iron study and ferritin. The patient was advised to call immediately if he has any other concerning symptoms in the interval. The total time spent in the appointment was 60 minutes. Disclaimer: This note was dictated with voice recognition software. Similar sounding words can inadvertently be transcribed and may be missed upon review. Eilleen Kempf, MD 01/26/21

## 2021-01-23 NOTE — Telephone Encounter (Signed)
Left detailed message stating that the provider would be willing to help with a prednisone dose pack to help patient get to next pain mgmt appointment

## 2021-01-26 ENCOUNTER — Inpatient Hospital Stay: Payer: PPO | Attending: Physician Assistant

## 2021-01-26 ENCOUNTER — Other Ambulatory Visit: Payer: Self-pay

## 2021-01-26 ENCOUNTER — Inpatient Hospital Stay (HOSPITAL_BASED_OUTPATIENT_CLINIC_OR_DEPARTMENT_OTHER): Payer: PPO | Admitting: Physician Assistant

## 2021-01-26 ENCOUNTER — Other Ambulatory Visit: Payer: Self-pay | Admitting: Physician Assistant

## 2021-01-26 VITALS — BP 131/67 | HR 86 | Temp 98.2°F | Resp 17 | Ht 69.0 in | Wt 156.6 lb

## 2021-01-26 DIAGNOSIS — D472 Monoclonal gammopathy: Secondary | ICD-10-CM | POA: Insufficient documentation

## 2021-01-26 DIAGNOSIS — E538 Deficiency of other specified B group vitamins: Secondary | ICD-10-CM | POA: Diagnosis not present

## 2021-01-26 DIAGNOSIS — D75838 Other thrombocytosis: Secondary | ICD-10-CM | POA: Insufficient documentation

## 2021-01-26 DIAGNOSIS — D509 Iron deficiency anemia, unspecified: Secondary | ICD-10-CM | POA: Insufficient documentation

## 2021-01-26 DIAGNOSIS — M069 Rheumatoid arthritis, unspecified: Secondary | ICD-10-CM | POA: Diagnosis not present

## 2021-01-26 DIAGNOSIS — D649 Anemia, unspecified: Secondary | ICD-10-CM

## 2021-01-26 DIAGNOSIS — D500 Iron deficiency anemia secondary to blood loss (chronic): Secondary | ICD-10-CM

## 2021-01-26 LAB — CBC WITH DIFFERENTIAL (CANCER CENTER ONLY)
Abs Immature Granulocytes: 0.03 10*3/uL (ref 0.00–0.07)
Basophils Absolute: 0.1 10*3/uL (ref 0.0–0.1)
Basophils Relative: 1 %
Eosinophils Absolute: 0 10*3/uL (ref 0.0–0.5)
Eosinophils Relative: 0 %
HCT: 34.7 % — ABNORMAL LOW (ref 39.0–52.0)
Hemoglobin: 10.2 g/dL — ABNORMAL LOW (ref 13.0–17.0)
Immature Granulocytes: 0 %
Lymphocytes Relative: 15 %
Lymphs Abs: 1.7 10*3/uL (ref 0.7–4.0)
MCH: 21.3 pg — ABNORMAL LOW (ref 26.0–34.0)
MCHC: 29.4 g/dL — ABNORMAL LOW (ref 30.0–36.0)
MCV: 72.3 fL — ABNORMAL LOW (ref 80.0–100.0)
Monocytes Absolute: 0.3 10*3/uL (ref 0.1–1.0)
Monocytes Relative: 2 %
Neutro Abs: 8.8 10*3/uL — ABNORMAL HIGH (ref 1.7–7.7)
Neutrophils Relative %: 82 %
Platelet Count: 644 10*3/uL — ABNORMAL HIGH (ref 150–400)
RBC: 4.8 MIL/uL (ref 4.22–5.81)
RDW: 23.6 % — ABNORMAL HIGH (ref 11.5–15.5)
WBC Count: 10.8 10*3/uL — ABNORMAL HIGH (ref 4.0–10.5)
nRBC: 0 % (ref 0.0–0.2)

## 2021-01-26 LAB — CMP (CANCER CENTER ONLY)
ALT: 15 U/L (ref 0–44)
AST: 13 U/L — ABNORMAL LOW (ref 15–41)
Albumin: 3.1 g/dL — ABNORMAL LOW (ref 3.5–5.0)
Alkaline Phosphatase: 106 U/L (ref 38–126)
Anion gap: 8 (ref 5–15)
BUN: 12 mg/dL (ref 6–20)
CO2: 24 mmol/L (ref 22–32)
Calcium: 8.9 mg/dL (ref 8.9–10.3)
Chloride: 104 mmol/L (ref 98–111)
Creatinine: 0.97 mg/dL (ref 0.61–1.24)
GFR, Estimated: >60 mL/min (ref >60)
Glucose, Bld: 115 mg/dL — ABNORMAL HIGH (ref 70–99)
Potassium: 3.7 mmol/L (ref 3.5–5.1)
Sodium: 136 mmol/L (ref 135–145)
Total Bilirubin: <0.2 mg/dL — ABNORMAL LOW (ref 0.3–1.2)
Total Protein: 8.3 g/dL — ABNORMAL HIGH (ref 6.5–8.1)

## 2021-01-26 LAB — FOLATE: Folate: 4 ng/mL — ABNORMAL LOW (ref >5.9)

## 2021-01-26 LAB — FERRITIN: Ferritin: 56 ng/mL (ref 24–336)

## 2021-01-26 LAB — IRON AND TIBC
Iron: 35 ug/dL — ABNORMAL LOW (ref 42–163)
Saturation Ratios: 10 % — ABNORMAL LOW (ref 20–55)
TIBC: 340 ug/dL (ref 202–409)
UIBC: 305 ug/dL (ref 117–376)

## 2021-01-26 LAB — VITAMIN B12: Vitamin B-12: 193 pg/mL (ref 180–914)

## 2021-01-26 LAB — LACTATE DEHYDROGENASE: LDH: 104 U/L (ref 98–192)

## 2021-01-26 MED ORDER — FOLIC ACID 1 MG PO TABS: 1.0000 mg | ORAL_TABLET | Freq: Every day | ORAL | 2 refills | Status: DC

## 2021-01-26 NOTE — Patient Instructions (Signed)
[phone]  [phone]. 

## 2021-01-27 ENCOUNTER — Other Ambulatory Visit: Payer: Self-pay | Admitting: Pharmacy Technician

## 2021-01-27 ENCOUNTER — Encounter: Payer: Self-pay | Admitting: Gastroenterology

## 2021-01-27 LAB — IGG, IGA, IGM
IgA: 152 mg/dL (ref 90–386)
IgG (Immunoglobin G), Serum: 2328 mg/dL — ABNORMAL HIGH (ref 603–1613)
IgM (Immunoglobulin M), Srm: 244 mg/dL — ABNORMAL HIGH (ref 20–172)

## 2021-01-27 LAB — KAPPA/LAMBDA LIGHT CHAINS
Kappa free light chain: 33.3 mg/L — ABNORMAL HIGH (ref 3.3–19.4)
Kappa, lambda light chain ratio: 1.52 (ref 0.26–1.65)
Lambda free light chains: 21.9 mg/L (ref 5.7–26.3)

## 2021-01-27 LAB — BETA 2 MICROGLOBULIN, SERUM: Beta-2 Microglobulin: 1.6 mg/L (ref 0.6–2.4)

## 2021-02-03 ENCOUNTER — Ambulatory Visit: Payer: Self-pay

## 2021-02-03 DIAGNOSIS — M545 Low back pain, unspecified: Secondary | ICD-10-CM | POA: Diagnosis not present

## 2021-02-03 DIAGNOSIS — Z9114 Patient's other noncompliance with medication regimen: Secondary | ICD-10-CM | POA: Diagnosis not present

## 2021-02-03 DIAGNOSIS — Z79899 Other long term (current) drug therapy: Secondary | ICD-10-CM | POA: Diagnosis not present

## 2021-02-05 DIAGNOSIS — Z79899 Other long term (current) drug therapy: Secondary | ICD-10-CM | POA: Diagnosis not present

## 2021-02-06 ENCOUNTER — Telehealth: Payer: Self-pay | Admitting: Physician Assistant

## 2021-02-06 NOTE — Telephone Encounter (Signed)
Sch per 10/10 los, left msg

## 2021-02-10 ENCOUNTER — Ambulatory Visit: Payer: Self-pay

## 2021-02-17 ENCOUNTER — Ambulatory Visit: Payer: Medicare HMO | Admitting: Family Medicine

## 2021-02-18 ENCOUNTER — Ambulatory Visit: Payer: Self-pay

## 2021-02-24 ENCOUNTER — Ambulatory Visit (INDEPENDENT_AMBULATORY_CARE_PROVIDER_SITE_OTHER): Payer: PPO | Admitting: Gastroenterology

## 2021-02-24 ENCOUNTER — Encounter: Payer: Self-pay | Admitting: Physician Assistant

## 2021-02-24 ENCOUNTER — Encounter: Payer: Self-pay | Admitting: Gastroenterology

## 2021-02-24 VITALS — BP 120/72 | HR 79 | Ht 71.0 in | Wt 162.2 lb

## 2021-02-24 DIAGNOSIS — D509 Iron deficiency anemia, unspecified: Secondary | ICD-10-CM | POA: Diagnosis not present

## 2021-02-24 DIAGNOSIS — R634 Abnormal weight loss: Secondary | ICD-10-CM

## 2021-02-24 NOTE — Progress Notes (Signed)
Referring Provider: Baruch Gouty, FNP Primary Care Physician:  Baruch Gouty, FNP  Reason for Consultation: Iron deficiency anemia   IMPRESSION:  Iron deficiency anemia without overt bleeding Intentional weight loss of 100 pounds that patient attributes to diet changes Rheumatoid arthritis currently managed on prednisone    - previously on methotrexate History of clinically diagnosed PUD as a teenager Intermittent rectal bleeding previously attributed to hemorrhoids No prior colon cancer screening Family history of colon cancer (maternal aunt with colon cancer in her 7s)  Endoscopic evaluation recommend to evaluate for GI blood loss sources of iron deficiency and weight loss.   PLAN: - Continue with iron replacement per hematology - EGD and colonoscopy with a 2 day bowel prep given the history of constipation   HPI: Erik Shaffer is a 55 y.o. male with iron deficiency anemia.  He has history of rheumatoid arthritis, back pain, hypertension, prediabetes anemia, MGUS, hyperlipidemia, tobacco use, chronic pain syndrome, and thrombocytosis previously treated with methotrexate and prednisone. He has bleeding stomach ulcers diagnosed clinically as a teenager due to worrying too much.  He recently moved to the area from [facility]. His sister accompanies him to this appointment.  He was noted to have iron deficiency anemia 2 years ago under the care while under the care of a hematologist in [facility].  He had concurrent leukocytosis and thrombocytosis that the hematologist felt was due to his rheumatoid arthritis with superimposed iron deficiency.  IV iron was recommended but the patient declined.  He lost over 100 pounds over 6 months time but attributes this to diet changes.  Labs in April 2022 show a hemoglobin of 9.1, hematocrit 33, MCV 72, platelets 997, iron 88, iron saturation 4%, ferritin 197.  Most recent labs from 01/26/21 showed hemoglobin 10.2, MCV 72.3, RDW 23.6,  platelets 644.  Seen by gastroenterologist in [facility] 08/06/2020. EGD and colonoscopy were recommended but these were not performed before he moved.   Started oral iron but had to discontinue this due to an intense burning sensation in the chest with consuming any liquids or solids that he attributed to thrush. It has sense resolved with treatment.  He recently established care with with the cancer center and is now starting IV iron infusions.  He has chronic constipation attributed to pain medications.  He has a history of hemorrhoids requiring lancing in his 78s.  He continues to have ongoing intermittent bright red blood when wiping and some straining.  He has used Naprosyn.  No prior abdominal imaging  No prior endoscopic evaluation  No prior abdominal surgeries  No prior colon cancer screening  Maternal aunt had colon cancer in her late 25s.  Father had lung cancer. Otherwise, there is no family history of colon cancer or polyps.  No family history of primary GI malignancies.  No chronic GI disorders or disease.   Past Medical History:  Diagnosis Date   Arthritis    Depression    Ulcer     Past Surgical History:  Procedure Laterality Date   HEMILAMINOTOMY LUMBAR SPINE  06/24/2010   MICRODISCECTOMY LUMBAR     L5-S1   SPINE SURGERY  02/2011   Dr. Sherley Bounds     Current Outpatient Medications  Medication Sig Dispense Refill   gabapentin (NEURONTIN) 600 MG tablet Take 600 mg by mouth daily.     predniSONE (DELTASONE) 10 MG tablet Take 10 mg by mouth 2 (two) times daily.     traMADol (ULTRAM) 50 MG  tablet Take by mouth.     triamcinolone (KENALOG) 0.025 % cream Apply topically 2 (two) times daily. Do not use for more than 2 days in a row. 80 g 0   No current facility-administered medications for this visit.    Allergies as of 02/24/2021 - Review Complete 02/24/2021  Allergen Reaction Noted   Tomato  03/01/2016   Butrans [buprenorphine]  02/14/2012    Family History   Problem Relation Age of Onset   Alcohol abuse Mother    Cancer Father    Arthritis Other    COPD Other        Lung cancer   Hyperlipidemia Other    Diabetes Other     Review of Systems: 12 system ROS is negative except as noted above with the additions of arthritis, back pain, muscle cramps, and insomnia.   Physical Exam: General:   Alert,  well-nourished, pleasant and cooperative in NAD Head:  Normocephalic and atraumatic. Eyes:  Sclera clear, no icterus.   Conjunctiva pink. Ears:  Normal auditory acuity. Nose:  No deformity, discharge,  or lesions. Mouth:  No deformity or lesions.   Neck:  Supple; no masses or thyromegaly. Lungs:  Clear throughout to auscultation.   No wheezes. Heart:  Regular rate and rhythm; no murmurs. Abdomen:  Soft, nontender, nondistended, normal bowel sounds, no rebound or guarding. No hepatosplenomegaly.   Rectal:  Deferred  Msk:  Symmetrical. RA changes to both hands.  LAD: No inguinal or umbilical LAD Extremities:  No clubbing or edema. Neurologic:  Alert and  oriented x4;  grossly nonfocal Skin:  Intact without significant lesions or rashes. Psych:  Alert and cooperative. Normal mood and affect.   Lakysha Kossman L. Tarri Glenn, MD, MPH 02/24/2021, 3:03 PM

## 2021-02-24 NOTE — Patient Instructions (Signed)
It was a pleasure to meet you today.  I have recommended an upper endoscopy and colonoscopy to evaluate your iron deficiency and screen for colon cancer.   Tips for colonoscopy:  - Stay well hydrated for 3-4 days prior to the exam. This reduces nausea and dehydration.  - To prevent skin/hemorrhoid irritation - prior to wiping, put A&Dointment or vaseline on the toilet paper. - Keep a towel or pad on the bed.  - Drink  64oz of clear liquids in the morning of prep day (prior to starting the prep) to be sure that there is enough fluid to flush the colon and stay hydrated!!!! This is in addition to the fluids required for preparation. - Use of a flavored hard candy, such as grape Anise Salvo, can counteract some of the flavor of the prep and may prevent some nausea.  Please let me know if you have any questions or concerns prior to your endoscopy.     COLONOSCOPY:   You have been scheduled for a colonoscopy. Please follow written instructions given to you at your visit today.   PREP:   Please pick up your prep supplies at the pharmacy within the next 1-3 days.  INHALERS:   If you use inhalers (even only as needed), please bring them with you on the day of your procedure.  MEDICATIONS TO HOLD:  COLONOSCOPY TIPS:  To reduce nausea and dehydration, stay well hydrated for 3-4 days prior to the exam.  To prevent skin/hemorrhoid irritation - prior to wiping, put A&Dointment or vaseline on the toilet paper. Keep a towel or pad on the bed.  BEFORE STARTING YOUR PREP, drink  64oz of clear liquids in the morning. This will help to flush the colon and will ensure you are well hydrated!!!!  NOTE - This is in addition to the fluids required for to complete your prep. Use of a flavored hard candy, such as grape Anise Salvo, can counteract some of the flavor of the prep and may prevent some nausea.   It was my pleasure to provide care to you today. Based on our discussion, I am providing you with  my recommendations below:  RECOMMENDATION(S):    FOLLOW UP:  After your procedure, you will receive a call from my office staff regarding my recommendation for follow up.  BMI:  If you are age 48 or older, your body mass index should be between 23-30. Your Body mass index is 22.63 kg/m. If this is out of the aforementioned range listed, please consider follow up with your Primary Care Provider.  If you are age 67 or younger, your body mass index should be between 19-25. Your Body mass index is 22.63 kg/m. If this is out of the aformentioned range listed, please consider follow up with your Primary Care Provider.   MY CHART:  The [place] [facility] providers would like to encourage you to use [facility] to communicate with providers for non-urgent requests or questions.  Due to long hold times on the telephone, sending your provider a message by [facility] may be a faster and more efficient way to get a response.  Please allow 48 business hours for a response.  Please remember that this is for non-urgent requests.   Thank you for trusting me with your gastrointestinal care!    Thornton Park, MD, MPH

## 2021-02-25 ENCOUNTER — Ambulatory Visit: Payer: Self-pay

## 2021-02-26 ENCOUNTER — Encounter: Payer: Self-pay | Admitting: Family Medicine

## 2021-02-26 ENCOUNTER — Other Ambulatory Visit: Payer: Self-pay

## 2021-02-26 ENCOUNTER — Ambulatory Visit (INDEPENDENT_AMBULATORY_CARE_PROVIDER_SITE_OTHER): Payer: PPO | Admitting: Family Medicine

## 2021-02-26 ENCOUNTER — Encounter: Payer: Self-pay | Admitting: Physician Assistant

## 2021-02-26 VITALS — BP 130/67 | HR 79 | Temp 98.1°F | Ht 71.0 in | Wt 163.4 lb

## 2021-02-26 DIAGNOSIS — G4701 Insomnia due to medical condition: Secondary | ICD-10-CM

## 2021-02-26 DIAGNOSIS — M0579 Rheumatoid arthritis with rheumatoid factor of multiple sites without organ or systems involvement: Secondary | ICD-10-CM

## 2021-02-26 DIAGNOSIS — G2581 Restless legs syndrome: Secondary | ICD-10-CM

## 2021-02-26 DIAGNOSIS — G894 Chronic pain syndrome: Secondary | ICD-10-CM | POA: Diagnosis not present

## 2021-02-26 DIAGNOSIS — D500 Iron deficiency anemia secondary to blood loss (chronic): Secondary | ICD-10-CM

## 2021-02-26 MED ORDER — MELATONIN 3 MG PO CAPS
6.0000 mg | ORAL_CAPSULE | Freq: Every evening | ORAL | 1 refills | Status: DC
  Filled 2021-02-26: qty 90, 45d supply, fill #0

## 2021-02-26 MED ORDER — TIZANIDINE HCL 2 MG PO TABS
2.0000 mg | ORAL_TABLET | Freq: Two times a day (BID) | ORAL | 0 refills | Status: DC
  Filled 2021-02-26: qty 30, 15d supply, fill #0

## 2021-02-26 NOTE — Progress Notes (Signed)
Subjective:  Patient ID: Erik Shaffer, male    DOB: [birth year], 55 y.o.   MRN: [id number]  Patient Care Team: Baruch Gouty, FNP as PCP - General (Family Medicine) Latanya Maudlin, MD (Orthopedic Surgery)   Chief Complaint:  Follow-up (RA follow up)   HPI: Erik Shaffer is a 55 y.o. male presenting on 02/26/2021 for Follow-up (RA follow up)  Patient presents today for follow-up on chronic pain due to rheumatoid arthritis.  He has established with pain management and is currently on gabapentin and tramadol.  He states this regimen has been significantly helpful.  He does report restless leg which is worse at night and is interfering with his sleep.  He does not take gabapentin in the evening which may be beneficial, patient will speak with pain management pertaining to this.  Patient states he has not received an appointment with rheumatology, will place new referral today.  Patient states he is feeling much better since initial visit but would like something to help with his restless legs until he can get back into see pain management and something to help him sleep.  He has not tried anything for sleep.  States he used to take Xanax in the past which would help him sleep, patient aware this is not a medication indicated for insomnia.  Patient was also noted to be significantly anemic at first visit, patient was referred to hematology and has followed up.  Last hemoglobin and hematocrit had improved.  Relevant past medical, surgical, family, and social history reviewed and updated as indicated.  Allergies and medications reviewed and updated. Data reviewed: Chart in Epic.   Past Medical History:  Diagnosis Date   Arthritis    Depression    Ulcer     Past Surgical History:  Procedure Laterality Date   HEMILAMINOTOMY LUMBAR SPINE  06/24/2010   MICRODISCECTOMY LUMBAR     L5-S1   SPINE SURGERY  02/2011   Dr. Sherley Bounds     Social History   Socioeconomic History   Marital  status: Divorced    Spouse name: Not on file   Number of children: 0   Years of education: Not on file   Highest education level: Not on file  Occupational History   Occupation: not working  Tobacco Use   Smoking status: Every Day    Packs/day: 1.00    Years: 30.00    Pack years: 30.00    Types: Cigarettes   Smokeless tobacco: Never  Vaping Use   Vaping Use: Never used  Substance and Sexual Activity   Alcohol use: No    Comment: nothing in 20 plus years   Drug use: No   Sexual activity: Yes  Other Topics Concern   Not on file  Social History Narrative   Caffienated drinks-yes   Seat belt use often-yes   Regular Exercise-no   Smoke alarm in the home-yes   Firearms/guns in the home-yes   History of physical abuse-no   HLE-12th grade   Lives with sister and her husband in a one story home.  Has no children.  Not married. Currently trying to get disability.            Social Determinants of Health   Financial Resource Strain: Not on file  Food Insecurity: Not on file  Transportation Needs: Not on file  Physical Activity: Not on file  Stress: Not on file  Social Connections: Not on file  Intimate Partner Violence: Not on  file    Outpatient Encounter Medications as of 02/26/2021  Medication Sig   gabapentin (NEURONTIN) 600 MG tablet Take 600 mg by mouth daily.   Melatonin 3 MG CAPS Take 2 capsules (6 mg total) by mouth at bedtime.   traMADol (ULTRAM) 50 MG tablet Take by mouth.   triamcinolone (KENALOG) 0.025 % cream Apply topically 2 (two) times daily. Do not use for more than 2 days in a row.   [DISCONTINUED] predniSONE (DELTASONE) 10 MG tablet Take 10 mg by mouth 2 (two) times daily.   tiZANidine (ZANAFLEX) 2 MG tablet Take 1 tablet (2 mg total) by mouth 2 (two) times daily.   [DISCONTINUED] pramipexole (MIRAPEX) 0.125 MG tablet Take 0.125 mg by mouth daily.   [DISCONTINUED] tiZANidine (ZANAFLEX) 2 MG tablet Take 2 mg by mouth 2 (two) times daily.   No  facility-administered encounter medications on file as of 02/26/2021.    Allergies  Allergen Reactions   Tomato    Butrans [Buprenorphine]     fatigue    Review of Systems  Constitutional:  Positive for fatigue. Negative for activity change, appetite change, chills, diaphoresis, fever and unexpected weight change.  HENT: Negative.    Eyes: Negative.  Negative for photophobia and visual disturbance.  Respiratory:  Negative for cough, chest tightness and shortness of breath.   Cardiovascular:  Negative for chest pain, palpitations and leg swelling.  Gastrointestinal:  Negative for abdominal pain, blood in stool, constipation, diarrhea, nausea and vomiting.  Endocrine: Negative.   Genitourinary:  Negative for decreased urine volume, difficulty urinating, dysuria, frequency and urgency.  Musculoskeletal:  Positive for arthralgias, back pain, gait problem, joint swelling, myalgias, neck pain and neck stiffness.  Skin: Negative.   Allergic/Immunologic: Negative.   Neurological:  Negative for dizziness, weakness and headaches.  Hematological: Negative.   Psychiatric/Behavioral:  Positive for sleep disturbance. Negative for confusion, hallucinations and suicidal ideas.   All other systems reviewed and are negative.      Objective:  BP 130/67   Pulse 79   Temp 98.1 F (36.7 C)   Ht 5\' 11"  (1.803 m)   Wt 163 lb 6.4 oz (74.1 kg)   SpO2 97%   BMI 22.79 kg/m    Wt Readings from Last 3 Encounters:  02/26/21 163 lb 6.4 oz (74.1 kg)  02/24/21 162 lb 4 oz (73.6 kg)  01/26/21 156 lb 9.6 oz (71 kg)    Physical Exam Vitals and nursing note reviewed.  Constitutional:      General: He is not in acute distress.    Appearance: Normal appearance. He is well-developed and well-groomed. He is not ill-appearing, toxic-appearing or diaphoretic.  HENT:     Head: Normocephalic and atraumatic.     Jaw: There is normal jaw occlusion.     Right Ear: Hearing normal.     Left Ear: Hearing normal.      Nose: Nose normal.     Mouth/Throat:     Lips: Pink.     Mouth: Mucous membranes are moist.     Pharynx: Oropharynx is clear. Uvula midline.  Eyes:     General: Lids are normal.     Extraocular Movements: Extraocular movements intact.     Conjunctiva/sclera: Conjunctivae normal.     Pupils: Pupils are equal, round, and reactive to light.  Neck:     Thyroid: No thyroid mass, thyromegaly or thyroid tenderness.     Vascular: No carotid bruit or JVD.     Trachea: Trachea and phonation normal.  Cardiovascular:     Rate and Rhythm: Normal rate and regular rhythm.     Chest Wall: PMI is not displaced.     Pulses: Normal pulses.     Heart sounds: Normal heart sounds. No murmur heard.   No friction rub. No gallop.  Pulmonary:     Effort: Pulmonary effort is normal. No respiratory distress.     Breath sounds: Normal breath sounds. No wheezing.  Abdominal:     General: Bowel sounds are normal. There is no distension or abdominal bruit.     Palpations: Abdomen is soft. There is no hepatomegaly or splenomegaly.     Tenderness: There is no abdominal tenderness. There is no right CVA tenderness or left CVA tenderness.     Hernia: No hernia is present.  Musculoskeletal:     Cervical back: Normal range of motion and neck supple.     Right lower leg: No edema.     Left lower leg: No edema.     Comments: Decreased ROM in bilateral hands, bilateral knees, neck, and back. Bilateral knee swelling without erythema, PIP joint swelling of hands.  Lymphadenopathy:     Cervical: No cervical adenopathy.  Skin:    General: Skin is warm and dry.     Capillary Refill: Capillary refill takes less than 2 seconds.     Coloration: Skin is not cyanotic, jaundiced or pale.     Findings: No rash.  Neurological:     General: No focal deficit present.     Mental Status: He is alert and oriented to person, place, and time.     Sensory: Sensation is intact.     Motor: Motor function is intact.      Coordination: Coordination is intact.     Gait: Gait abnormal.     Deep Tendon Reflexes: Reflexes are normal and symmetric.  Psychiatric:        Attention and Perception: Attention and perception normal.        Mood and Affect: Mood and affect normal.        Speech: Speech normal.        Behavior: Behavior normal. Behavior is cooperative.        Thought Content: Thought content normal.        Cognition and Memory: Cognition and memory normal.        Judgment: Judgment normal.    Results for orders placed or performed in visit on 01/26/21  Lactate dehydrogenase (LDH)  Result Value Ref Range   LDH 104 98 - 192 U/L  QIG  (Quant. immunoglobulins  - IgG, IgA, IgM)  Result Value Ref Range   IgG (Immunoglobin G), Serum 2,328 (H) 603 - 1,613 mg/dL   IgA 152 90 - 386 mg/dL   IgM (Immunoglobulin M), Srm 244 (H) 20 - 172 mg/dL  Beta 2 microglobulin  Result Value Ref Range   Beta-2 Microglobulin 1.6 0.6 - 2.4 mg/L  Kappa/lambda light chains  Result Value Ref Range   Kappa free light chain 33.3 (H) 3.3 - 19.4 mg/L   Lambda free light chains 21.9 5.7 - 26.3 mg/L   Kappa, lambda light chain ratio 1.52 0.26 - 1.65  Folate  Result Value Ref Range   Folate 4.0 (L) >5.9 ng/mL  Vitamin B12  Result Value Ref Range   Vitamin B-12 193 180 - 914 pg/mL  Iron and TIBC  Result Value Ref Range   Iron 35 (L) 42 - 163 ug/dL   TIBC 340 202 -  409 ug/dL   Saturation Ratios 10 (L) 20 - 55 %   UIBC 305 117 - 376 ug/dL  Ferritin  Result Value Ref Range   Ferritin 56 24 - 336 ng/mL  CMP (Cancer Center only)  Result Value Ref Range   Sodium 136 135 - 145 mmol/L   Potassium 3.7 3.5 - 5.1 mmol/L   Chloride 104 98 - 111 mmol/L   CO2 24 22 - 32 mmol/L   Glucose, Bld 115 (H) 70 - 99 mg/dL   BUN 12 6 - 20 mg/dL   Creatinine 0.97 0.61 - 1.24 mg/dL   Calcium 8.9 8.9 - 10.3 mg/dL   Total Protein 8.3 (H) 6.5 - 8.1 g/dL   Albumin 3.1 (L) 3.5 - 5.0 g/dL   AST 13 (L) 15 - 41 U/L   ALT 15 0 - 44 U/L    Alkaline Phosphatase 106 38 - 126 U/L   Total Bilirubin <0.2 (L) 0.3 - 1.2 mg/dL   GFR, Estimated >60 >60 mL/min   Anion gap 8 5 - 15  CBC with Differential (Cancer Center Only)  Result Value Ref Range   WBC Count 10.8 (H) 4.0 - 10.5 K/uL   RBC 4.80 4.22 - 5.81 MIL/uL   Hemoglobin 10.2 (L) 13.0 - 17.0 g/dL   HCT 34.7 (L) 39.0 - 52.0 %   MCV 72.3 (L) 80.0 - 100.0 fL   MCH 21.3 (L) 26.0 - 34.0 pg   MCHC 29.4 (L) 30.0 - 36.0 g/dL   RDW 23.6 (H) 11.5 - 15.5 %   Platelet Count 644 (H) 150 - 400 K/uL   nRBC 0.0 0.0 - 0.2 %   Neutrophils Relative % 82 %   Neutro Abs 8.8 (H) 1.7 - 7.7 K/uL   Lymphocytes Relative 15 %   Lymphs Abs 1.7 0.7 - 4.0 K/uL   Monocytes Relative 2 %   Monocytes Absolute 0.3 0.1 - 1.0 K/uL   Eosinophils Relative 0 %   Eosinophils Absolute 0.0 0.0 - 0.5 K/uL   Basophils Relative 1 %   Basophils Absolute 0.1 0.0 - 0.1 K/uL   Immature Granulocytes 0 %   Abs Immature Granulocytes 0.03 0.00 - 0.07 K/uL       Pertinent labs & imaging results that were available during my care of the patient were reviewed by me and considered in my medical decision making.  Assessment & Plan:  Dozier was seen today for follow-up.  Diagnoses and all orders for this visit:  Rheumatoid arthritis involving multiple sites with positive rheumatoid factor ([place]) Has not established with rheumatology, will place new referral today.  -     Ambulatory referral to Rheumatology  Chronic pain disorder Restless leg Has established with pain management and has upcoming appointment Thursday. Pt to discuss ongoing leg pain and restless leg when in clinic. Will provide short course of Zanaflex until pt is able to see pain management.  -     tiZANidine (ZANAFLEX) 2 MG tablet; Take 1 tablet (2 mg total) by mouth 2 (two) times daily.  Insomnia due to medical condition Patient has not tried melatonin.  Will initiate today.  Patient aware to start at 3 mg nightly and titrate up to a maximum of 12 mg  nightly as needed for insomnia.  Sleep hygiene discussed in detail. -     Melatonin 3 MG CAPS; Take 2 capsules (6 mg total) by mouth at bedtime.  IDA Followed by hematology  Continue all other maintenance medications.  Follow  up plan: Return in about 6 months (around 08/26/2021), or if symptoms worsen or fail to improve.   Continue healthy lifestyle choices, including diet (rich in fruits, vegetables, and lean proteins, and low in salt and simple carbohydrates) and exercise (at least 30 minutes of moderate physical activity daily).  Educational handout given for insomnia  The above assessment and management plan was discussed with the patient. The patient verbalized understanding of and has agreed to the management plan. Patient is aware to call the clinic if they develop any new symptoms or if symptoms persist or worsen. Patient is aware when to return to the clinic for a follow-up visit. Patient educated on when it is appropriate to go to the emergency department.   Monia Pouch, FNP-C [facility] [phone]

## 2021-02-27 ENCOUNTER — Telehealth: Payer: Self-pay | Admitting: Family Medicine

## 2021-02-27 ENCOUNTER — Other Ambulatory Visit: Payer: Self-pay | Admitting: *Deleted

## 2021-02-27 ENCOUNTER — Other Ambulatory Visit: Payer: Self-pay

## 2021-02-27 DIAGNOSIS — G4701 Insomnia due to medical condition: Secondary | ICD-10-CM

## 2021-02-27 DIAGNOSIS — G894 Chronic pain syndrome: Secondary | ICD-10-CM

## 2021-02-27 DIAGNOSIS — G2581 Restless legs syndrome: Secondary | ICD-10-CM

## 2021-02-27 MED ORDER — TIZANIDINE HCL 2 MG PO TABS: 2.0000 mg | ORAL_TABLET | Freq: Two times a day (BID) | ORAL | 0 refills | Status: DC

## 2021-02-27 MED ORDER — MELATONIN 3 MG PO CAPS: 6.0000 mg | ORAL_CAPSULE | Freq: Every evening | ORAL | 1 refills | Status: DC

## 2021-02-27 NOTE — Telephone Encounter (Signed)
Pt needs his prescriptions for Melatonin 3 MG CAPS & tiZANidine (ZANAFLEX) 2 MG tablet sent to [facility], [address] AT [place], when they were called in they went to the wrong pharmacy. Please resend.

## 2021-02-27 NOTE — Telephone Encounter (Signed)
Prescriptions routed to [facility]. Patient notified. Asked patient if I could clean up his pharmacy list. He agreed.

## 2021-03-03 DIAGNOSIS — Z79899 Other long term (current) drug therapy: Secondary | ICD-10-CM | POA: Diagnosis not present

## 2021-03-03 DIAGNOSIS — Z9114 Patient's other noncompliance with medication regimen: Secondary | ICD-10-CM | POA: Diagnosis not present

## 2021-03-03 DIAGNOSIS — M545 Low back pain, unspecified: Secondary | ICD-10-CM | POA: Diagnosis not present

## 2021-03-04 ENCOUNTER — Ambulatory Visit: Payer: Self-pay

## 2021-03-04 ENCOUNTER — Other Ambulatory Visit: Payer: Self-pay

## 2021-03-05 ENCOUNTER — Other Ambulatory Visit: Payer: Self-pay

## 2021-03-05 DIAGNOSIS — Z79899 Other long term (current) drug therapy: Secondary | ICD-10-CM | POA: Diagnosis not present

## 2021-03-13 ENCOUNTER — Other Ambulatory Visit: Payer: Self-pay | Admitting: Family Medicine

## 2021-03-13 DIAGNOSIS — G2581 Restless legs syndrome: Secondary | ICD-10-CM

## 2021-03-13 DIAGNOSIS — G894 Chronic pain syndrome: Secondary | ICD-10-CM

## 2021-03-17 ENCOUNTER — Telehealth: Payer: Self-pay | Admitting: Family Medicine

## 2021-03-23 ENCOUNTER — Ambulatory Visit (INDEPENDENT_AMBULATORY_CARE_PROVIDER_SITE_OTHER): Payer: PPO

## 2021-03-23 VITALS — Ht 71.0 in | Wt 163.0 lb

## 2021-03-23 DIAGNOSIS — Z0000 Encounter for general adult medical examination without abnormal findings: Secondary | ICD-10-CM

## 2021-03-23 NOTE — Patient Instructions (Signed)
Erik Shaffer , Thank you for taking time to come for your Medicare Wellness Visit. I appreciate your ongoing commitment to your health goals. Please review the following plan we discussed and let me know if I can assist you in the future.   Screening recommendations/referrals: Colonoscopy: Scheduled for 2023. Recommended yearly ophthalmology/optometry visit for glaucoma screening and checkup Recommended yearly dental visit for hygiene and checkup  Vaccinations: Influenza vaccine: Declined. Pneumococcal vaccine: Not due until age 73. Tdap vaccine: Done 03/25/2007. Due. Repeat in 10 years  Shingles vaccine: Declined.   Covid-19: Declined.  Advanced directives: Advance directive discussed with you today. Even though you declined this today, please call our office should you change your mind, and we can give you the proper paperwork for you to fill out.   Conditions/risks identified: Aim for 30 minutes of exercise or brisk walking each day, drink 6-8 glasses of water and eat lots of fruits and vegetables.   Next appointment: Follow up in one year for your annual wellness visit 2023.  Preventive Care 40-64 Years, Male Preventive care refers to lifestyle choices and visits with your health care provider that can promote health and wellness. What does preventive care include? A yearly physical exam. This is also called an annual well check. Dental exams once or twice a year. Routine eye exams. Ask your health care provider how often you should have your eyes checked. Personal lifestyle choices, including: Daily care of your teeth and gums. Regular physical activity. Eating a healthy diet. Avoiding tobacco and drug use. Limiting alcohol use. Practicing safe sex. Taking low-dose aspirin every day starting at age 30. What happens during an annual well check? The services and screenings done by your health care provider during your annual well check will depend on your age, overall health,  lifestyle risk factors, and family history of disease. Counseling  Your health care provider may ask you questions about your: Alcohol use. Tobacco use. Drug use. Emotional well-being. Home and relationship well-being. Sexual activity. Eating habits. Work and work Statistician. Screening  You may have the following tests or measurements: Height, weight, and BMI. Blood pressure. Lipid and cholesterol levels. These may be checked every 5 years, or more frequently if you are over 17 years old. Skin check. Lung cancer screening. You may have this screening every year starting at age 69 if you have a 30-pack-year history of smoking and currently smoke or have quit within the past 15 years. Fecal occult blood test (FOBT) of the stool. You may have this test every year starting at age 55. Flexible sigmoidoscopy or colonoscopy. You may have a sigmoidoscopy every 5 years or a colonoscopy every 10 years starting at age 22. Prostate cancer screening. Recommendations will vary depending on your family history and other risks. Hepatitis C blood test. Hepatitis B blood test. Sexually transmitted disease (STD) testing. Diabetes screening. This is done by checking your blood sugar (glucose) after you have not eaten for a while (fasting). You may have this done every 1-3 years. Discuss your test results, treatment options, and if necessary, the need for more tests with your health care provider. Vaccines  Your health care provider may recommend certain vaccines, such as: Influenza vaccine. This is recommended every year. Tetanus, diphtheria, and acellular pertussis (Tdap, Td) vaccine. You may need a Td booster every 10 years. Zoster vaccine. You may need this after age 52. Pneumococcal 13-valent conjugate (PCV13) vaccine. You may need this if you have certain conditions and have not been vaccinated.  Pneumococcal polysaccharide (PPSV23) vaccine. You may need one or two doses if you smoke cigarettes or  if you have certain conditions. Talk to your health care provider about which screenings and vaccines you need and how often you need them. This information is not intended to replace advice given to you by your health care provider. Make sure you discuss any questions you have with your health care provider. Document Released: 05/02/2015 Document Revised: 12/24/2015 Document Reviewed: 02/04/2015 Elsevier Interactive Patient Education  2017 [place] Prevention in the Home Falls can cause injuries. They can happen to people of all ages. There are many things you can do to make your home safe and to help prevent falls. What can I do on the outside of my home? Regularly fix the edges of walkways and driveways and fix any cracks. Remove anything that might make you trip as you walk through a door, such as a raised step or threshold. Trim any bushes or trees on the path to your home. Use bright outdoor lighting. Clear any walking paths of anything that might make someone trip, such as rocks or tools. Regularly check to see if handrails are loose or broken. Make sure that both sides of any steps have handrails. Any raised decks and porches should have guardrails on the edges. Have any leaves, snow, or ice cleared regularly. Use sand or salt on walking paths during winter. Clean up any spills in your garage right away. This includes oil or grease spills. What can I do in the bathroom? Use night lights. Install grab bars by the toilet and in the tub and shower. Do not use towel bars as grab bars. Use non-skid mats or decals in the tub or shower. If you need to sit down in the shower, use a plastic, non-slip stool. Keep the floor dry. Clean up any water that spills on the floor as soon as it happens. Remove soap buildup in the tub or shower regularly. Attach bath mats securely with double-sided non-slip rug tape. Do not have throw rugs and other things on the floor that can make you  trip. What can I do in the bedroom? Use night lights. Make sure that you have a light by your bed that is easy to reach. Do not use any sheets or blankets that are too big for your bed. They should not hang down onto the floor. Have a firm chair that has side arms. You can use this for support while you get dressed. Do not have throw rugs and other things on the floor that can make you trip. What can I do in the kitchen? Clean up any spills right away. Avoid walking on wet floors. Keep items that you use a lot in easy-to-reach places. If you need to reach something above you, use a strong step stool that has a grab bar. Keep electrical cords out of the way. Do not use floor polish or wax that makes floors slippery. If you must use wax, use non-skid floor wax. Do not have throw rugs and other things on the floor that can make you trip. What can I do with my stairs? Do not leave any items on the stairs. Make sure that there are handrails on both sides of the stairs and use them. Fix handrails that are broken or loose. Make sure that handrails are as long as the stairways. Check any carpeting to make sure that it is firmly attached to the stairs. Fix any carpet that  is loose or worn. Avoid having throw rugs at the top or bottom of the stairs. If you do have throw rugs, attach them to the floor with carpet tape. Make sure that you have a light switch at the top of the stairs and the bottom of the stairs. If you do not have them, ask someone to add them for you. What else can I do to help prevent falls? Wear shoes that: Do not have high heels. Have rubber bottoms. Are comfortable and fit you well. Are closed at the toe. Do not wear sandals. If you use a stepladder: Make sure that it is fully opened. Do not climb a closed stepladder. Make sure that both sides of the stepladder are locked into place. Ask someone to hold it for you, if possible. Clearly mark and make sure that you can  see: Any grab bars or handrails. First and last steps. Where the edge of each step is. Use tools that help you move around (mobility aids) if they are needed. These include: Canes. Walkers. Scooters. Crutches. Turn on the lights when you go into a dark area. Replace any light bulbs as soon as they burn out. Set up your furniture so you have a clear path. Avoid moving your furniture around. If any of your floors are uneven, fix them. If there are any pets around you, be aware of where they are. Review your medicines with your doctor. Some medicines can make you feel dizzy. This can increase your chance of falling. Ask your doctor what other things that you can do to help prevent falls. This information is not intended to replace advice given to you by your health care provider. Make sure you discuss any questions you have with your health care provider. Document Released: 01/30/2009 Document Revised: 09/11/2015 Document Reviewed: 05/10/2014 Elsevier Interactive Patient Education  2017 [employer].

## 2021-03-23 NOTE — Progress Notes (Signed)
Subjective:   Erik Shaffer is a 55 y.o. male who presents for an Initial Medicare Annual Wellness Visit. Virtual Visit via Telephone Note  I connected with  Erik Shaffer on 03/23/21 at  3:30 PM EST by telephone and verified that I am speaking with the correct person using two identifiers.  Location: Patient:Home Provider: [facility] Persons participating in the virtual visit: patient/Nurse Health Advisor   I discussed the limitations, risks, security and privacy concerns of performing an evaluation and management service by telephone and the availability of in person appointments. The patient expressed understanding and agreed to proceed.  Interactive audio and video telecommunications were attempted between this nurse and patient, however failed, due to patient having technical difficulties OR patient did not have access to video capability.  We continued and completed visit with audio only.  Some vital signs may be absent or patient reported.   Chriss Driver, LPN  Review of Systems     Cardiac Risk Factors include: advanced age (>98men, >53 women);hypertension;dyslipidemia;male gender;sedentary lifestyle;smoking/ tobacco exposure     Objective:    Today's Vitals   03/23/21 1533 03/23/21 1535  Weight: 163 lb (73.9 kg)   Height: 5\' 11"  (1.803 m)   PainSc:  6    Body mass index is 22.73 kg/m.  Advanced Directives 03/23/2021 01/26/2021 10/28/2016 09/02/2016 08/02/2016 07/02/2016 05/14/2016  Does Patient Have a Medical Advance Directive? No No No No No No No  Would patient like information on creating a medical advance directive? No - Patient declined No - Patient declined - - No - Patient declined No - Patient declined No - Patient declined    Current Medications (verified) Outpatient Encounter Medications as of 03/23/2021  Medication Sig   gabapentin (NEURONTIN) 600 MG tablet Take 600 mg by mouth daily. Pt states he takes 300 mg in AM and 300 mg in PM   predniSONE  (DELTASONE) 10 MG tablet Take 10 mg by mouth 2 (two) times daily.   tiZANidine (ZANAFLEX) 2 MG tablet Take 1 tablet (2 mg total) by mouth 2 (two) times daily.   traMADol (ULTRAM) 50 MG tablet Take by mouth.   triamcinolone (KENALOG) 0.025 % cream Apply topically 2 (two) times daily. Do not use for more than 2 days in a row.   Melatonin 3 MG CAPS Take 2 capsules (6 mg total) by mouth at bedtime. (Patient not taking: Reported on 03/23/2021)   No facility-administered encounter medications on file as of 03/23/2021.    Allergies (verified) Tomato and Butrans [buprenorphine]   History: Past Medical History:  Diagnosis Date   Arthritis    Depression    Ulcer    Past Surgical History:  Procedure Laterality Date   HEMILAMINOTOMY LUMBAR SPINE  06/24/2010   MICRODISCECTOMY LUMBAR     L5-S1   SPINE SURGERY  02/2011   Dr. Sherley Bounds    Family History  Problem Relation Age of Onset   Alcohol abuse Mother    Cancer Father    Arthritis Other    COPD Other        Lung cancer   Hyperlipidemia Other    Diabetes Other    Social History   Socioeconomic History   Marital status: Divorced    Spouse name: Not on file   Number of children: 0   Years of education: Not on file   Highest education level: Not on file  Occupational History   Occupation: not working  Tobacco Use   Smoking  status: Every Day    Packs/day: 1.00    Years: 30.00    Pack years: 30.00    Types: Cigarettes   Smokeless tobacco: Never  Vaping Use   Vaping Use: Never used  Substance and Sexual Activity   Alcohol use: No    Comment: nothing in 20 plus years   Drug use: No   Sexual activity: Yes  Other Topics Concern   Not on file  Social History Narrative   Caffienated drinks-yes   Seat belt use often-yes   Regular Exercise-no   Smoke alarm in the home-yes   Firearms/guns in the home-yes   History of physical abuse-no   HLE-12th grade   Lives with sister and her husband in a one story home.  Has no  children.  Not married. Currently trying to get disability.            Social Determinants of Health   Financial Resource Strain: Low Risk    Difficulty of Paying Living Expenses: Not hard at all  Food Insecurity: No Food Insecurity   Worried About Charity fundraiser in the Last Year: Never true   [place] in the Last Year: Never true  Transportation Needs: No Transportation Needs   Lack of Transportation (Medical): No   Lack of Transportation (Non-Medical): No  Physical Activity: Insufficiently Active   Days of Exercise per Week: 3 days   Minutes of Exercise per Session: 30 min  Stress: No Stress Concern Present   Feeling of Stress : Not at all  Social Connections: Socially Isolated   Frequency of Communication with Friends and Family: More than three times a week   Frequency of Social Gatherings with Friends and Family: More than three times a week   Attends Religious Services: Never   Marine scientist or Organizations: No   Attends Music therapist: Never   Marital Status: Divorced    Tobacco Counseling Ready to quit: Not Answered Counseling given: Not Answered   Clinical Intake:  Pre-visit preparation completed: Yes  Pain : 0-10 Pain Score: 6  Pain Type: Chronic pain Pain Location: Knee Pain Descriptors / Indicators: Aching Pain Onset: More than a month ago Pain Frequency: Intermittent     BMI - recorded: 22.73 Nutritional Status: BMI of 19-24  Normal Nutritional Risks: None Diabetes: No  How often do you need to have someone help you when you read instructions, pamphlets, or other written materials from your doctor or pharmacy?: 1 - Never  Diabetic?no  Interpreter Needed?: No  Information entered by :: MJ Bernadean Saling, LPN   Activities of Daily Living In your present state of health, do you have any difficulty performing the following activities: 03/23/2021  Hearing? Y  Vision? N  Difficulty concentrating or making decisions?  N  Walking or climbing stairs? N  Dressing or bathing? N  Doing errands, shopping? N  Preparing Food and eating ? N  Using the Toilet? N  In the past six months, have you accidently leaked urine? N  Do you have problems with loss of bowel control? N  Managing your Medications? N  Managing your Finances? N  Housekeeping or managing your Housekeeping? N  Some recent data might be hidden    Patient Care Team: Rakes, Connye Burkitt, FNP as PCP - General (Family Medicine) Latanya Maudlin, MD (Orthopedic Surgery)  Indicate any recent Medical Services you may have received from other than [facility] providers in the past year (date may be  approximate).     Assessment:   This is a routine wellness examination for Erik Shaffer.  Hearing/Vision screen Hearing Screening - Comments:: 95% hearing loss in L ear.  Vision Screening - Comments:: Readers. No optometrist.   Dietary issues and exercise activities discussed: Current Exercise Habits: Home exercise routine, Type of exercise: Other - see comments (Pt states he has an electric drum set that he plays 2-3 times per week.), Time (Minutes): 30, Frequency (Times/Week): 3, Weekly Exercise (Minutes/Week): 90, Intensity: Mild, Exercise limited by: cardiac condition(s);orthopedic condition(s)   Goals Addressed             This Visit's Progress    Have 3 meals a day       Continue to try and stay healthy.       Depression Screen PHQ 2/9 Scores 03/23/2021 02/26/2021 01/14/2021 05/16/2017 10/28/2016 09/02/2016 08/02/2016  PHQ - 2 Score 0 3 2 1 3 2 2   PHQ- 9 Score 1 12 8 5 6 5 3     Fall Risk Fall Risk  03/23/2021 02/26/2021 01/14/2021 09/02/2016 06/16/2016  Falls in the past year? 0 0 0 No Yes  Number falls in past yr: 0 - - - 1  Injury with Fall? 0 - - - No  Risk for fall due to : No Fall Risks - - - Other (Comment)  Follow up Falls prevention discussed - - - Falls evaluation completed;Education provided;Falls prevention discussed    FALL RISK PREVENTION  PERTAINING TO THE HOME:  Any stairs in or around the home? No  If so, are there any without handrails? No  Home free of loose throw rugs in walkways, pet beds, electrical cords, etc? No  Adequate lighting in your home to reduce risk of falls? No   ASSISTIVE DEVICES UTILIZED TO PREVENT FALLS:  Life alert? No  Use of a cane, walker or w/c? No  Grab bars in the bathroom? No  Shower chair or bench in shower? No  Elevated toilet seat or a handicapped toilet? No   TIMED UP AND GO:  Was the test performed? No .  Phone visit.  Cognitive Function:     6CIT Screen 03/23/2021  What Year? 0 points  What month? 0 points  What time? 0 points  Count back from 20 0 points  Months in reverse 2 points  Repeat phrase 0 points  Total Score 2    Immunizations Immunization History  Administered Date(s) Administered   Tdap 03/25/2007    TDAP status: Due, Education has been provided regarding the importance of this vaccine. Advised may receive this vaccine at local pharmacy or Health Dept. Aware to provide a copy of the vaccination record if obtained from local pharmacy or Health Dept. Verbalized acceptance and understanding.  Flu Vaccine status: Declined, Education has been provided regarding the importance of this vaccine but patient still declined. Advised may receive this vaccine at local pharmacy or Health Dept. Aware to provide a copy of the vaccination record if obtained from local pharmacy or Health Dept. Verbalized acceptance and understanding.  Pneumococcal vaccine status: Declined,  Education has been provided regarding the importance of this vaccine but patient still declined. Advised may receive this vaccine at local pharmacy or Health Dept. Aware to provide a copy of the vaccination record if obtained from local pharmacy or Health Dept. Verbalized acceptance and understanding.   Covid-19 vaccine status: Declined, Education has been provided regarding the importance of this vaccine  but patient still declined. Advised may receive this vaccine  at local pharmacy or Health Dept.or vaccine clinic. Aware to provide a copy of the vaccination record if obtained from local pharmacy or Health Dept. Verbalized acceptance and understanding.  Qualifies for Shingles Vaccine? Yes   Zostavax completed No   Shingrix Completed?: No.    Education has been provided regarding the importance of this vaccine. Patient has been advised to call insurance company to determine out of pocket expense if they have not yet received this vaccine. Advised may also receive vaccine at local pharmacy or Health Dept. Verbalized acceptance and understanding.  Screening Tests Health Maintenance  Topic Date Due   COVID-19 Vaccine (1) Never done   Pneumococcal Vaccine 78-66 Years old (1 - PCV) Never done   Zoster Vaccines- Shingrix (1 of 2) 04/15/2021 (Originally 11/17/1984)   INFLUENZA VACCINE  07/17/2021 (Originally 11/17/2020)   TETANUS/TDAP  01/14/2022 (Originally 03/24/2017)   Hepatitis C Screening  01/14/2022 (Originally 11/18/1983)   COLONOSCOPY (Pts 45-42yrs Insurance coverage will need to be confirmed)  11/16/2025 (Originally 11/18/2010)   HIV Screening  Completed   HPV VACCINES  Aged Out    Health Maintenance  Health Maintenance Due  Topic Date Due   COVID-19 Vaccine (1) Never done   Pneumococcal Vaccine 51-3 Years old (1 - PCV) Never done    Colorectal cancer screening: Type of screening: Colonoscopy. Completed SCHEDULED FOR 2023 PER PT. Repeat every 10 years  Lung Cancer Screening: (Low Dose CT Chest recommended if Age 43-80 years, 30 pack-year currently smoking OR have quit w/in 15years.) does qualify.   Lung Cancer Screening Referral: PT DECLINED REFERRAL.  Additional Screening:  Hepatitis C Screening: does not qualify.  Vision Screening: Recommended annual ophthalmology exams for early detection of glaucoma and other disorders of the eye. Is the patient up to date with their annual eye  exam?  No  Who is the provider or what is the name of the office in which the patient attends annual eye exams? N/A If pt is not established with a provider, would they like to be referred to a provider to establish care? No .   Dental Screening: Recommended annual dental exams for proper oral hygiene  Community Resource Referral / Chronic Care Management: CRR required this visit?  No   CCM required this visit?  No      Plan:     I have personally reviewed and noted the following in the patient's chart:   Medical and social history Use of alcohol, tobacco or illicit drugs  Current medications and supplements including opioid prescriptions. Patient is not currently taking opioid prescriptions. Functional ability and status Nutritional status Physical activity Advanced directives List of other physicians Hospitalizations, surgeries, and ER visits in previous 12 months Vitals Screenings to include cognitive, depression, and falls Referrals and appointments  In addition, I have reviewed and discussed with patient certain preventive protocols, quality metrics, and best practice recommendations. A written personalized care plan for preventive services as well as general preventive health recommendations were provided to patient.     Chriss Driver, LPN   89/05/1192   Nurse Notes: Phone visit. Pt at home. 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 Pt has colonoscopy scheduled for 2023. Pt declined all vaccines. Pt declined optometry referral.

## 2021-04-01 DIAGNOSIS — M545 Low back pain, unspecified: Secondary | ICD-10-CM | POA: Diagnosis not present

## 2021-04-01 DIAGNOSIS — M62838 Other muscle spasm: Secondary | ICD-10-CM | POA: Diagnosis not present

## 2021-04-01 DIAGNOSIS — Z9114 Patient's other noncompliance with medication regimen: Secondary | ICD-10-CM | POA: Diagnosis not present

## 2021-04-01 DIAGNOSIS — Z79899 Other long term (current) drug therapy: Secondary | ICD-10-CM | POA: Diagnosis not present

## 2021-04-03 DIAGNOSIS — Z79899 Other long term (current) drug therapy: Secondary | ICD-10-CM | POA: Diagnosis not present

## 2021-04-23 ENCOUNTER — Other Ambulatory Visit: Payer: Self-pay

## 2021-04-23 ENCOUNTER — Encounter: Payer: Self-pay | Admitting: Gastroenterology

## 2021-04-23 ENCOUNTER — Ambulatory Visit (AMBULATORY_SURGERY_CENTER): Payer: PPO | Admitting: Gastroenterology

## 2021-04-23 VITALS — BP 110/61 | HR 65 | Temp 97.8°F | Resp 12 | Ht 71.0 in | Wt 162.0 lb

## 2021-04-23 DIAGNOSIS — K2100 Gastro-esophageal reflux disease with esophagitis, without bleeding: Secondary | ICD-10-CM | POA: Diagnosis not present

## 2021-04-23 DIAGNOSIS — D125 Benign neoplasm of sigmoid colon: Secondary | ICD-10-CM | POA: Diagnosis not present

## 2021-04-23 DIAGNOSIS — D122 Benign neoplasm of ascending colon: Secondary | ICD-10-CM | POA: Diagnosis not present

## 2021-04-23 DIAGNOSIS — K219 Gastro-esophageal reflux disease without esophagitis: Secondary | ICD-10-CM

## 2021-04-23 DIAGNOSIS — Z1211 Encounter for screening for malignant neoplasm of colon: Secondary | ICD-10-CM

## 2021-04-23 DIAGNOSIS — K449 Diaphragmatic hernia without obstruction or gangrene: Secondary | ICD-10-CM

## 2021-04-23 DIAGNOSIS — K2980 Duodenitis without bleeding: Secondary | ICD-10-CM

## 2021-04-23 DIAGNOSIS — R634 Abnormal weight loss: Secondary | ICD-10-CM

## 2021-04-23 DIAGNOSIS — D509 Iron deficiency anemia, unspecified: Secondary | ICD-10-CM | POA: Diagnosis not present

## 2021-04-23 MED ORDER — SODIUM CHLORIDE 0.9 % IV SOLN: 500.0000 mL | Freq: Once | INTRAVENOUS | Status: DC

## 2021-04-23 MED ORDER — PANTOPRAZOLE SODIUM 40 MG PO TBEC: 40.0000 mg | DELAYED_RELEASE_TABLET | Freq: Two times a day (BID) | ORAL | 3 refills | Status: DC

## 2021-04-23 NOTE — Progress Notes (Signed)
Called to room to assist during endoscopic procedure.  Patient ID and intended procedure confirmed with present staff. Received instructions for my participation in the procedure from the performing physician.  

## 2021-04-23 NOTE — Op Note (Signed)
[place] Patient Name: Erik Shaffer Procedure Date: 04/23/2021 10:50 AM MRN: [id number] Endoscopist: Thornton Park MD, MD Age: 56 Referring MD:  Date of Birth: 06-09-1965 Gender: Male Account #: [id number] Procedure:                Upper GI endoscopy Indications:              Unexplained iron deficiency anemia Medicines:                Monitored Anesthesia Care Procedure:                Pre-Anesthesia Assessment:                           - Prior to the procedure, a History and Physical                            was performed, and patient medications and                            allergies were reviewed. The patient's tolerance of                            previous anesthesia was also reviewed. The risks                            and benefits of the procedure and the sedation                            options and risks were discussed with the patient.                            All questions were answered, and informed consent                            was obtained. Prior Anticoagulants: The patient has                            taken no previous anticoagulant or antiplatelet                            agents. ASA Grade Assessment: II - A patient with                            mild systemic disease. After reviewing the risks                            and benefits, the patient was deemed in                            satisfactory condition to undergo the procedure.                           After obtaining informed consent, the endoscope was  passed under direct vision. Throughout the                            procedure, the patient's blood pressure, pulse, and                            oxygen saturations were monitored continuously. The                            GIF [id number] #[id number] was introduced through the                            mouth, and advanced to the third part of duodenum.                            The upper GI endoscopy  was accomplished without                            difficulty. The patient tolerated the procedure                            well. Scope In: Scope Out: Findings:                 LA Grade A (one or more mucosal breaks less than 5                            mm, not extending between tops of 2 mucosal folds)                            esophagitis with no bleeding was found 34 cm from                            the incisors at the z-line. Biopsies were taken                            with a cold forceps for histology. Estimated blood                            loss was minimal.                           There are two areas of scarring in the pre-pyloric                            area, most consistent with prior ulcer sites. There                            is one large erosion in the antrum. Biopsies were                            taken from the antrum, body, and fundus with a cold  forceps for histology. A small hiatal hernia is                            present. Estimated blood loss was minimal.                           The examined duodenum was normal except for mild                            erythema in the bulb. Biopsies were taken with a                            cold forceps for histology. Estimated blood loss                            was minimal.                           The cardia and gastric fundus were normal on                            retroflexion.                           The exam was otherwise without abnormality. Complications:            No immediate complications. Estimated blood loss:                            Minimal. Estimated Blood Loss:     Estimated blood loss was minimal. Impression:               - LA Grade A reflux esophagitis with no bleeding.                            Biopsied.                           - Small hiatal hernia.                           - Gastric mucosal scarring suggesting healed                             gastric ulcers.                           - Large gastric erosion. Biopsied.                           - Duodenitis. Biopsied.                           - The examination was otherwise normal. Recommendation:           - Patient has a contact number available for  emergencies. The signs and symptoms of potential                            delayed complications were discussed with the                            patient. Return to normal activities tomorrow.                            Written discharge instructions were provided to the                            patient.                           - Resume previous diet.                           - Continue present medications.                           - Avoid NSAIDs.                           - Start pantoprazole 40 mg BID for 8 weeks.                           - Await pathology results. Thornton Park MD, MD 04/23/2021 11:26:48 AM This report has been signed electronically.

## 2021-04-23 NOTE — Op Note (Signed)
[place] Patient Name: Erik Shaffer Procedure Date: 04/23/2021 10:50 AM MRN: [id number] Endoscopist: Thornton Park MD, MD Age: 56 Referring MD:  Date of Birth: [birth year] Gender: Male Account #: [id number] Procedure:                Colonoscopy Indications:              Unexplained iron deficiency anemia, No prior                            colopnoscopy Medicines:                Monitored Anesthesia Care Procedure:                Pre-Anesthesia Assessment:                           - Prior to the procedure, a History and Physical                            was performed, and patient medications and                            allergies were reviewed. The patient's tolerance of                            previous anesthesia was also reviewed. The risks                            and benefits of the procedure and the sedation                            options and risks were discussed with the patient.                            All questions were answered, and informed consent                            was obtained. Prior Anticoagulants: The patient has                            taken no previous anticoagulant or antiplatelet                            agents. ASA Grade Assessment: II - A patient with                            mild systemic disease. After reviewing the risks                            and benefits, the patient was deemed in                            satisfactory condition to undergo the procedure.  After obtaining informed consent, the colonoscope                            was passed under direct vision. Throughout the                            procedure, the patient's blood pressure, pulse, and                            oxygen saturations were monitored continuously. The                            CF [id number] #[id number] was introduced through the anus                            and advanced to the 3 cm into the ileum. The                             patient tolerated the procedure well. The quality                            of the bowel preparation was evaluated using the                            BBPS [facility] Bowel Preparation Scale) with scores                            of: Right Colon = 1 (portion of mucosa seen, but                            other areas not well seen due to staining, residual                            stool and/or opaque liquid), Transverse Colon = 1                            (portion of mucosa seen, but other areas not well                            seen due to staining, residual stool and/or opaque                            liquid) and Left Colon = 1 (portion of mucosa seen,                            but other areas not well seen due to staining,                            residual stool and/or opaque liquid). The total                            BBPS score equals 3.  The quality of the bowel                            preparation was inadequate. The ileocecal valve,                            appendiceal orifice, and rectum were photographed.                            The colonoscopy was performed with moderate                            difficulty due to inadequate bowel prep. Scope In: 11:10:31 AM Scope Out: 11:20:16 AM Scope Withdrawal Time: 0 hours 5 minutes 25 seconds  Total Procedure Duration: 0 hours 9 minutes 45 seconds  Findings:                 The perianal and digital rectal examinations were                            normal.                           A 4 mm polyp was found in the sigmoid colon. The                            polyp was flat. The polyp was removed with a cold                            snare. Resection and retrieval were complete.                            Estimated blood loss was minimal.                           A 3 mm polyp was found in the ascending colon. The                            polyp was sessile. The polyp was removed with a                             cold snare. Resection and retrieval were complete.                            Estimated blood loss was minimal.                           The exam was otherwise without abnormality on                            direct and retroflexion views. Complications:            No immediate complications. Estimated blood loss:  Minimal. Estimated Blood Loss:     Estimated blood loss was minimal. Impression:               - Preparation of the colon was inadequate.                           - Preparation of the colon was inadequate.                           - One 4 mm polyp in the sigmoid colon, removed with                            a cold snare. Resected and retrieved.                           - One 3 mm polyp in the ascending colon, removed                            with a cold snare. Resected and retrieved.                           - The examination was otherwise normal on direct                            and retroflexion views. Recommendation:           - Patient has a contact number available for                            emergencies. The signs and symptoms of potential                            delayed complications were discussed with the                            patient. Return to normal activities tomorrow.                            Written discharge instructions were provided to the                            patient.                           - Resume previous diet.                           - Continue present medications.                           - Await pathology results.                           - Repeat colonoscopy at the next available  appointment because the bowel preparation was poor.                            Will need to use antiemetics prior to colon prep                            dosing to minimize nausea. Thornton Park MD, MD 04/23/2021 11:30:56 AM This report has been signed electronically.

## 2021-04-23 NOTE — Progress Notes (Deleted)
[facility] OFFICE PROGRESS NOTE  Baruch Gouty, FNP [place] [address]  DIAGNOSIS: MGUS, iron deficiency anemia, and reactive thrombocytosis  PRIOR THERAPY: None  CURRENT THERAPY: ****  INTERVAL HISTORY: Erik Shaffer 56 y.o. male returns to the clinic for a follow up visit. The patient was first seen in our clinic on 01/26/21. He had recently moved to the area from [facility] to be closer to his sister. The patient has a complex medical history. He saw Dr. Wyn Quaker in [facility] for the same concern, who felt that anemia and thrombocytosis was likely reactive and secondary to his rheumatoid arthritis with superimposed iron deficiency. He had BCR/ABL, JAK2, MPL, and CALR testing which was negative. The patient also had monoclonal gammopathy with mildly elevated light chain and M protein identified with immunofixation.  He was also found to have 2 M spike's, both IgG kappa.  He had a bone marrow biopsy and aspirate performed which showed 5% plasma cells which is consistent with MGUS. The patient also was found to have iron deficiency anemia. Oral iron***? Compliant***. The patient reports that the oral iron caused him to have an intense burning sensation in his chest whenever he ate or swallowed food and drinks. He is not currently taking any PO iron because of this. In addition to a history of iron-deficiency, patient has a history of folate deficiency for which he used to take oral supplements for. He reports he stopped taking folate at the same time he stopped PO iron due to side effects. At his last appointment, his B12 was also low.   Did he even get venofer ****???  We arreanged for the patient to receive IV iron which was lst given on ***. We also recommended B12 and folate supplements ***  The patient has been seeing Dr. Tarri Glenn from gastroenterology for his iron deficiency anemia to rule out GI blood loss.   Overall, he is feeling ***.  Fatigue? Bleeding*** CP, LH, Dizziness, SOB, palp. Today, he reports feeling well other than his increasing fatigue. He reports that he does not have energy during the day and he is largely sedentary at home due to fatigue and his RA. He denies any shortness of breath. Does report chronic, sharp chest pain and pressure that he reports has been there since he injured his back 1.5 years ago. He denies any lightheadedness or dizziness. Other than occasional bleeding from hemorrhoids, he denies any signs of acute bleeding including gingival bleeding, hemoptysis, melena, or easy bruising. He denies ice cravings. He does report worsening peripheral neuropathy and pain primarily in his feet. States he did not mention this at his last primary care appointment.  Denies nausea, vomiting, constipation or diarrhea. Denies fevers, chills, night sweats or unintentional weight loss.   Colonoscopy 04/23/21: - One 4 mm polyp in the sigmoid colon, removed with a cold snare. Resected and retrieved. - One 3 mm polyp in the ascending colon, removed with a cold snare. Resected and retrieved.  LA Grade A reflux esophagitis with no bleeding. Biopsied. - Small hiatal hernia. - Gastric mucosal scarring suggesting healed gastric ulcers. - Large gastric erosion. Biopsied. - Duodenitis. Biopsied. - The examination was otherwise normal.    MEDICAL HISTORY: Past Medical History:  Diagnosis Date   Arthritis    Depression    Ulcer     ALLERGIES:  is allergic to tomato and butrans [buprenorphine].  MEDICATIONS:  Current Outpatient Medications  Medication Sig Dispense Refill  gabapentin (NEURONTIN) 600 MG tablet Take 600 mg by mouth daily. Pt states he takes 300 mg in AM and 300 mg in PM     Melatonin 3 MG CAPS Take 2 capsules (6 mg total) by mouth at bedtime. (Patient not taking: Reported on 03/23/2021) 90 capsule 1   pantoprazole (PROTONIX) 40 MG tablet Take 1 tablet (40 mg total) by mouth 2 (two) times daily. 90 tablet 3    predniSONE (DELTASONE) 10 MG tablet Take 10 mg by mouth 2 (two) times daily. (Patient not taking: Reported on 04/23/2021)     tiZANidine (ZANAFLEX) 2 MG tablet Take 1 tablet (2 mg total) by mouth 2 (two) times daily. 30 tablet 0   traMADol (ULTRAM) 50 MG tablet Take by mouth.     triamcinolone (KENALOG) 0.025 % cream Apply topically 2 (two) times daily. Do not use for more than 2 days in a row. 80 g 0   No current facility-administered medications for this visit.    SURGICAL HISTORY:  Past Surgical History:  Procedure Laterality Date   HEMILAMINOTOMY LUMBAR SPINE  06/24/2010   MICRODISCECTOMY LUMBAR     L5-S1   SPINE SURGERY  02/2011   Dr. Sherley Bounds     REVIEW OF SYSTEMS:   Review of Systems  Constitutional: Negative for appetite change, chills, fatigue, fever and unexpected weight change.  HENT:   Negative for mouth sores, nosebleeds, sore throat and trouble swallowing.   Eyes: Negative for eye problems and icterus.  Respiratory: Negative for cough, hemoptysis, shortness of breath and wheezing.   Cardiovascular: Negative for chest pain and leg swelling.  Gastrointestinal: Negative for abdominal pain, constipation, diarrhea, nausea and vomiting.  Genitourinary: Negative for bladder incontinence, difficulty urinating, dysuria, frequency and hematuria.   Musculoskeletal: Negative for back pain, gait problem, neck pain and neck stiffness.  Skin: Negative for itching and rash.  Neurological: Negative for dizziness, extremity weakness, gait problem, headaches, light-headedness and seizures.  Hematological: Negative for adenopathy. Does not bruise/bleed easily.  Psychiatric/Behavioral: Negative for confusion, depression and sleep disturbance. The patient is not nervous/anxious.     PHYSICAL EXAMINATION:  There were no vitals taken for this visit.  ECOG PERFORMANCE STATUS: {CHL ONC ECOG [id number]  Physical Exam  Constitutional: Oriented to person, place, and time and  well-developed, well-nourished, and in no distress. No distress.  HENT:  Head: Normocephalic and atraumatic.  Mouth/Throat: Oropharynx is clear and moist. No oropharyngeal exudate.  Eyes: Conjunctivae are normal. Right eye exhibits no discharge. Left eye exhibits no discharge. No scleral icterus.  Neck: Normal range of motion. Neck supple.  Cardiovascular: Normal rate, regular rhythm, normal heart sounds and intact distal pulses.   Pulmonary/Chest: Effort normal and breath sounds normal. No respiratory distress. No wheezes. No rales.  Abdominal: Soft. Bowel sounds are normal. Exhibits no distension and no mass. There is no tenderness.  Musculoskeletal: Normal range of motion. Exhibits no edema.  Lymphadenopathy:    No cervical adenopathy.  Neurological: Alert and oriented to person, place, and time. Exhibits normal muscle tone. Gait normal. Coordination normal.  Skin: Skin is warm and dry. No rash noted. Not diaphoretic. No erythema. No pallor.  Psychiatric: Mood, memory and judgment normal.  Vitals reviewed.  LABORATORY DATA: Lab Results  Component Value Date   WBC 10.8 (H) 01/26/2021   HGB 10.2 (L) 01/26/2021   HCT 34.7 (L) 01/26/2021   MCV 72.3 (L) 01/26/2021   PLT 644 (H) 01/26/2021      Chemistry  Component Value Date/Time   NA 136 01/26/2021 1251   NA 131 (L) 01/14/2021 1525   K 3.7 01/26/2021 1251   CL 104 01/26/2021 1251   CO2 24 01/26/2021 1251   BUN 12 01/26/2021 1251   BUN 12 01/14/2021 1525   CREATININE 0.97 01/26/2021 1251   CREATININE 0.82 03/01/2016 1032      Component Value Date/Time   CALCIUM 8.9 01/26/2021 1251   ALKPHOS 106 01/26/2021 1251   AST 13 (L) 01/26/2021 1251   ALT 15 01/26/2021 1251   BILITOT <0.2 (L) 01/26/2021 1251       RADIOGRAPHIC STUDIES:  No results found.   ASSESSMENT/PLAN:  This is a very pleasant 56 year old Caucasian male with MGUS, iron deficiency anemia, and reactive thrombocytosis.  The patient was seen with Dr.  Julien Nordmann today. The patient's labs from today demonstrate: Hgb ***, WBC ***, MCV ***, Plt ***, Ferritin of ***, Iron ***, Saturation Ratio of ***%. Labs also showed low folate at *** and borderline low B12 at ***.   He receives IV iron with venofer PRN. Last dose given on ***.   B12 and folate supplements.   For MGUS, we will repeat myeloma panel every 6 months and will continue to monitor.  We will see him back for follow-up visit in 3 months for evaluation and repeat CBC, iron studies, ferritin, B12 and folate levels.  Follow with GI for pathology results. ***  No orders of the defined types were placed in this encounter.    I spent {CHL ONC TIME VISIT - RFFMB:[id number]} counseling the patient face to face. The total time spent in the appointment was {CHL ONC TIME VISIT - SVXBL:[id number]}.  Yeny Schmoll L Amyah Clawson, PA-C 04/23/21

## 2021-04-23 NOTE — Progress Notes (Signed)
Referring Provider: Baruch Gouty, FNP Primary Care Physician:  Baruch Gouty, FNP  Reason for Procedure:  Iron deficiency anemia   IMPRESSION:  Iron deficiency anemia without overt bleeding Intentional weight loss of 100 pounds that patient attributes to diet changes Rheumatoid arthritis currently managed on prednisone    - previously on methotrexate History of clinically diagnosed PUD as a teenager Intermittent rectal bleeding previously attributed to hemorrhoids No prior colon cancer screening Family history of colon cancer (maternal aunt with colon cancer in her 36s) Appropriate candidate for monitored anesthesia care  PLAN: EGD and Colonoscopy in the [facility] today   HPI: Erik Shaffer is a 56 y.o. male presents for endoscopic evaluation of iron deficiency anemia. Please see my office note from 02/24/21 for complete details.   No prior colonoscopy or colon cancer screening.  Maternal aunt had colon cancer in her late 11s.  Father had lung cancer. Otherwise, there is no family history of colon cancer or polyps.  No family history of primary GI malignancies.  No chronic GI disorders or disease.   Past Medical History:  Diagnosis Date   Arthritis    Depression    Ulcer     Past Surgical History:  Procedure Laterality Date   HEMILAMINOTOMY LUMBAR SPINE  06/24/2010   MICRODISCECTOMY LUMBAR     L5-S1   SPINE SURGERY  02/2011   Dr. Sherley Bounds     Current Outpatient Medications  Medication Sig Dispense Refill   gabapentin (NEURONTIN) 600 MG tablet Take 600 mg by mouth daily. Pt states he takes 300 mg in AM and 300 mg in PM     tiZANidine (ZANAFLEX) 2 MG tablet Take 1 tablet (2 mg total) by mouth 2 (two) times daily. 30 tablet 0   traMADol (ULTRAM) 50 MG tablet Take by mouth.     triamcinolone (KENALOG) 0.025 % cream Apply topically 2 (two) times daily. Do not use for more than 2 days in a row. 80 g 0   Melatonin 3 MG CAPS Take 2 capsules (6 mg total) by mouth at  bedtime. (Patient not taking: Reported on 03/23/2021) 90 capsule 1   predniSONE (DELTASONE) 10 MG tablet Take 10 mg by mouth 2 (two) times daily. (Patient not taking: Reported on 04/23/2021)     Current Facility-Administered Medications  Medication Dose Route Frequency Provider Last Rate Last Admin   0.9 %  sodium chloride infusion  500 mL Intravenous Once Thornton Park, MD        Allergies as of 04/23/2021 - Review Complete 04/23/2021  Allergen Reaction Noted   Tomato  03/01/2016   Butrans [buprenorphine]  02/14/2012    Family History  Problem Relation Age of Onset   Alcohol abuse Mother    Cancer Father    Arthritis Other    COPD Other        Lung cancer   Hyperlipidemia Other    Diabetes Other    Stomach cancer Neg Hx    Rectal cancer Neg Hx    Esophageal cancer Neg Hx    Colon cancer Neg Hx      Physical Exam: General:   Alert,  well-nourished, pleasant and cooperative in NAD Head:  Normocephalic and atraumatic. Eyes:  Sclera clear, no icterus.   Conjunctiva pink. Mouth:  No deformity or lesions.   Neck:  Supple; no masses or thyromegaly. Lungs:  Clear throughout to auscultation.   No wheezes. Heart:  Regular rate and rhythm; no murmurs. Abdomen:  Soft,  non-tender, nondistended, normal bowel sounds, no rebound or guarding.  Msk:  Symmetrical. No boney deformities LAD: No inguinal or umbilical LAD Extremities:  No clubbing or edema. Neurologic:  Alert and  oriented x4;  grossly nonfocal Skin:  No obvious rash or bruise. Psych:  Alert and cooperative. Normal mood and affect.     Studies/Results: No results found.    Latrese Carolan L. Tarri Glenn, MD, MPH 04/23/2021, 10:48 AM

## 2021-04-23 NOTE — Progress Notes (Signed)
Pt in recovery with monitors in place, VSS. Report given to receiving RN. Bite guard was placed with pt awake to ensure comfort. No dental or soft tissue damage noted. 

## 2021-04-23 NOTE — Patient Instructions (Addendum)
HANDOUTS PROVIDED ON: ESOPHAGITIS, HIATAL HERNIA, & POLYPS  The polyps removed/biopsies taken today have been sent for pathology.  The results can take 1-3 weeks to receive.  Pleas call our office to schedule repeat colonoscopy as soon as possible due to inadequate prep.  You may resume your previous diet and medication schedule.  Thank you for allowing [name] to care for you today!!!   YOU HAD AN ENDOSCOPIC PROCEDURE TODAY AT [facility]:   Refer to the procedure report that was given to you for any specific questions about what was found during the examination.  If the procedure report does not answer your questions, please call your gastroenterologist to clarify.  If you requested that your care partner not be given the details of your procedure findings, then the procedure report has been included in a sealed envelope for you to review at your convenience later.  YOU SHOULD EXPECT: Some feelings of bloating in the abdomen. Passage of more gas than usual.  Walking can help get rid of the air that was put into your GI tract during the procedure and reduce the bloating. If you had a lower endoscopy (such as a colonoscopy or flexible sigmoidoscopy) you may notice spotting of blood in your stool or on the toilet paper. If you underwent a bowel prep for your procedure, you may not have a normal bowel movement for a few days.  Please Note:  You might notice some irritation and congestion in your nose or some drainage.  This is from the oxygen used during your procedure.  There is no need for concern and it should clear up in a day or so.  SYMPTOMS TO REPORT IMMEDIATELY:  Following lower endoscopy (colonoscopy or flexible sigmoidoscopy):  Excessive amounts of blood in the stool  Significant tenderness or worsening of abdominal pains  Swelling of the abdomen that is new, acute  Fever of 100F or higher  Following upper endoscopy (EGD)  Vomiting of blood or coffee ground material  New  chest pain or pain under the shoulder blades  Painful or persistently difficult swallowing  New shortness of breath  Fever of 100F or higher  Black, tarry-looking stools  For urgent or emergent issues, a gastroenterologist can be reached at any hour by calling [phone]. Do not use MyChart messaging for urgent concerns.    DIET:  We do recommend a small meal at first, but then you may proceed to your regular diet.  Drink plenty of fluids but you should avoid alcoholic beverages for 24 hours.  ACTIVITY:  You should plan to take it easy for the rest of today and you should NOT DRIVE or use heavy machinery until tomorrow (because of the sedation medicines used during the test).    FOLLOW UP: Our staff will call the number listed on your records Monday between 7:15 am and 8:15 am following your procedure to check on you and address any questions or concerns that you may have regarding the information given to you following your procedure. If we do not reach you, we will leave a message.  We will attempt to reach you two times.  During this call, we will ask if you have developed any symptoms of COVID 19. If you develop any symptoms (ie: fever, flu-like symptoms, shortness of breath, cough etc.) before then, please call [phone].  If you test positive for Covid 19 in the 2 weeks post procedure, please call and report this information to [name].  If any biopsies were taken you will be contacted by phone or by letter within the next 1-3 weeks.  Please call us at [phone] if you have not heard about the biopsies in 3 weeks.    SIGNATURES/CONFIDENTIALITY: You and/or your care partner have signed paperwork which will be entered into your electronic medical record.  These signatures attest to the fact that that the information above on your After Visit Summary has been reviewed and is understood.  Full responsibility of the confidentiality of this discharge information lies with you and/or  your care-partner.

## 2021-04-23 NOTE — Progress Notes (Signed)
Per Dr. Tarri Glenn recommendation patient was offered next available appointment for repeat colonscopy.  Patient states he would like to "schedule for the day before never".  RN encouraged patient to call clinic to reschedule at his earliest convenience.  Patient and care partner both verbalized understanding.

## 2021-04-24 ENCOUNTER — Telehealth: Payer: Self-pay

## 2021-04-24 NOTE — Telephone Encounter (Signed)
From 04/23/21 colonoscopy:  Repeat colonoscopy at the next available appointment because the bowel preparation was poor. Will need to use antiemetics prior to colon prep dosing to minimize nausea.  Called pt to schedule repeat colon. LVM requesting returned call.

## 2021-04-27 ENCOUNTER — Telehealth: Payer: Self-pay

## 2021-04-27 ENCOUNTER — Inpatient Hospital Stay: Payer: PPO | Attending: Physician Assistant

## 2021-04-27 ENCOUNTER — Inpatient Hospital Stay: Payer: PPO | Admitting: Physician Assistant

## 2021-04-27 NOTE — Telephone Encounter (Signed)
Called pt to reschedule colonoscopy. Asked to call back in the next couple of months d/t scheduling conflicts and his unavailability.

## 2021-04-27 NOTE — Telephone Encounter (Signed)
No answer, left message to call back later today, B.Rasheen Schewe RN. 

## 2021-04-27 NOTE — Telephone Encounter (Signed)
Second attempt follow up call to pt, no answer.  

## 2021-04-28 ENCOUNTER — Telehealth: Payer: Self-pay | Admitting: Physician Assistant

## 2021-04-28 ENCOUNTER — Telehealth: Payer: Self-pay | Admitting: Family Medicine

## 2021-04-28 ENCOUNTER — Other Ambulatory Visit: Payer: Self-pay | Admitting: Family Medicine

## 2021-04-28 DIAGNOSIS — M0579 Rheumatoid arthritis with rheumatoid factor of multiple sites without organ or systems involvement: Secondary | ICD-10-CM

## 2021-04-28 NOTE — Telephone Encounter (Signed)
Sch per 1/9 inbasket, pt did NOT let me schedule per PA order, pt says he will call back in after results of recent proceudre.

## 2021-04-28 NOTE — Telephone Encounter (Signed)
Patient returned call, stated that he is doing well.

## 2021-04-29 DIAGNOSIS — M79641 Pain in right hand: Secondary | ICD-10-CM | POA: Diagnosis not present

## 2021-04-29 DIAGNOSIS — M25571 Pain in right ankle and joints of right foot: Secondary | ICD-10-CM | POA: Diagnosis not present

## 2021-04-29 DIAGNOSIS — M1990 Unspecified osteoarthritis, unspecified site: Secondary | ICD-10-CM | POA: Diagnosis not present

## 2021-04-29 DIAGNOSIS — M25572 Pain in left ankle and joints of left foot: Secondary | ICD-10-CM | POA: Diagnosis not present

## 2021-04-29 DIAGNOSIS — M7989 Other specified soft tissue disorders: Secondary | ICD-10-CM | POA: Diagnosis not present

## 2021-04-29 DIAGNOSIS — M79671 Pain in right foot: Secondary | ICD-10-CM | POA: Diagnosis not present

## 2021-04-29 DIAGNOSIS — M5030 Other cervical disc degeneration, unspecified cervical region: Secondary | ICD-10-CM | POA: Diagnosis not present

## 2021-04-29 DIAGNOSIS — M79672 Pain in left foot: Secondary | ICD-10-CM | POA: Diagnosis not present

## 2021-04-29 DIAGNOSIS — M0579 Rheumatoid arthritis with rheumatoid factor of multiple sites without organ or systems involvement: Secondary | ICD-10-CM | POA: Diagnosis not present

## 2021-04-29 DIAGNOSIS — M5136 Other intervertebral disc degeneration, lumbar region: Secondary | ICD-10-CM | POA: Diagnosis not present

## 2021-04-29 DIAGNOSIS — M79642 Pain in left hand: Secondary | ICD-10-CM | POA: Diagnosis not present

## 2021-05-06 ENCOUNTER — Encounter: Payer: Self-pay | Admitting: Gastroenterology

## 2021-05-06 NOTE — Telephone Encounter (Signed)
Recall placed

## 2021-05-28 DIAGNOSIS — M545 Low back pain, unspecified: Secondary | ICD-10-CM | POA: Diagnosis not present

## 2021-05-28 DIAGNOSIS — Z79899 Other long term (current) drug therapy: Secondary | ICD-10-CM | POA: Diagnosis not present

## 2021-05-28 DIAGNOSIS — M62838 Other muscle spasm: Secondary | ICD-10-CM | POA: Diagnosis not present

## 2021-05-28 DIAGNOSIS — Z9114 Patient's other noncompliance with medication regimen: Secondary | ICD-10-CM | POA: Diagnosis not present

## 2021-06-02 DIAGNOSIS — Z79899 Other long term (current) drug therapy: Secondary | ICD-10-CM | POA: Diagnosis not present

## 2021-06-02 DIAGNOSIS — M5136 Other intervertebral disc degeneration, lumbar region: Secondary | ICD-10-CM | POA: Diagnosis not present

## 2021-06-02 DIAGNOSIS — M1990 Unspecified osteoarthritis, unspecified site: Secondary | ICD-10-CM | POA: Diagnosis not present

## 2021-06-02 DIAGNOSIS — M0579 Rheumatoid arthritis with rheumatoid factor of multiple sites without organ or systems involvement: Secondary | ICD-10-CM | POA: Diagnosis not present

## 2021-06-02 DIAGNOSIS — M5030 Other cervical disc degeneration, unspecified cervical region: Secondary | ICD-10-CM | POA: Diagnosis not present

## 2021-06-04 ENCOUNTER — Encounter: Payer: Self-pay | Admitting: Family Medicine

## 2021-06-04 ENCOUNTER — Ambulatory Visit (INDEPENDENT_AMBULATORY_CARE_PROVIDER_SITE_OTHER): Payer: PPO | Admitting: Family Medicine

## 2021-06-04 VITALS — BP 142/72 | HR 73 | Temp 97.8°F | Ht 71.0 in | Wt 190.0 lb

## 2021-06-04 DIAGNOSIS — M1990 Unspecified osteoarthritis, unspecified site: Secondary | ICD-10-CM | POA: Insufficient documentation

## 2021-06-04 DIAGNOSIS — F5101 Primary insomnia: Secondary | ICD-10-CM

## 2021-06-04 DIAGNOSIS — M2550 Pain in unspecified joint: Secondary | ICD-10-CM | POA: Insufficient documentation

## 2021-06-04 DIAGNOSIS — L309 Dermatitis, unspecified: Secondary | ICD-10-CM | POA: Insufficient documentation

## 2021-06-04 DIAGNOSIS — M5136 Other intervertebral disc degeneration, lumbar region: Secondary | ICD-10-CM | POA: Insufficient documentation

## 2021-06-04 MED ORDER — TRAZODONE HCL 50 MG PO TABS: 25.0000 mg | ORAL_TABLET | Freq: Every evening | ORAL | 3 refills | Status: DC | PRN

## 2021-06-04 MED ORDER — EUCRISA 2 % EX OINT: 1.0000 "application " | TOPICAL_OINTMENT | Freq: Every day | CUTANEOUS | 2 refills | Status: DC

## 2021-06-04 NOTE — Progress Notes (Signed)
Subjective:  Patient ID: Erik Shaffer, male    DOB: 1966-01-06, 56 y.o.   MRN: [id number]  Patient Care Team: Baruch Gouty, FNP as PCP - General (Family Medicine) Latanya Maudlin, MD (Orthopedic Surgery)   Chief Complaint:  Medication Refill   HPI: Erik Shaffer is a 56 y.o. male presenting on 06/04/2021 for Medication Refill   Pt presents today for a refill of cream for his facial eczema. States he was on a steroid cream in the past and has been out of this for several months. He has not tried other creams in the past for symptom relief. He reports dry, erythematous, scaling rash to nasolabial folds and around ears.  He also reports insomnia. States he has been taking melatonin for the last several weeks without resolution of symptoms.     Relevant past medical, surgical, family, and social history reviewed and updated as indicated.  Allergies and medications reviewed and updated. Data reviewed: Chart in Epic.   Past Medical History:  Diagnosis Date   Arthritis    Depression    Ulcer     Past Surgical History:  Procedure Laterality Date   HEMILAMINOTOMY LUMBAR SPINE  06/24/2010   MICRODISCECTOMY LUMBAR     L5-S1   SPINE SURGERY  02/2011   Dr. Sherley Bounds     Social History   Socioeconomic History   Marital status: Divorced    Spouse name: Not on file   Number of children: 0   Years of education: Not on file   Highest education level: Not on file  Occupational History   Occupation: not working  Tobacco Use   Smoking status: Every Day    Packs/day: 1.00    Years: 30.00    Pack years: 30.00    Types: Cigarettes   Smokeless tobacco: Never  Vaping Use   Vaping Use: Never used  Substance and Sexual Activity   Alcohol use: No    Comment: nothing in 20 plus years   Drug use: No   Sexual activity: Yes  Other Topics Concern   Not on file  Social History Narrative   Caffienated drinks-yes   Seat belt use often-yes   Regular Exercise-no   Smoke alarm in  the home-yes   Firearms/guns in the home-yes   History of physical abuse-no   HLE-12th grade   Lives with sister and her husband in a one story home.  Has no children.  Not married. Currently trying to get disability.            Social Determinants of Health   Financial Resource Strain: Low Risk    Difficulty of Paying Living Expenses: Not hard at all  Food Insecurity: No Food Insecurity   Worried About Charity fundraiser in the Last Year: Never true   [place] in the Last Year: Never true  Transportation Needs: No Transportation Needs   Lack of Transportation (Medical): No   Lack of Transportation (Non-Medical): No  Physical Activity: Insufficiently Active   Days of Exercise per Week: 3 days   Minutes of Exercise per Session: 30 min  Stress: No Stress Concern Present   Feeling of Stress : Not at all  Social Connections: Socially Isolated   Frequency of Communication with Friends and Family: More than three times a week   Frequency of Social Gatherings with Friends and Family: More than three times a week   Attends Religious Services: Never   Active Member  of Clubs or Organizations: No   Attends Archivist Meetings: Never   Marital Status: Divorced  Human resources officer Violence: Not At Risk   Fear of Current or Ex-Partner: No   Emotionally Abused: No   Physically Abused: No   Sexually Abused: No    Outpatient Encounter Medications as of 06/04/2021  Medication Sig   Crisaborole (EUCRISA) 2 % OINT Apply 1 application topically daily.   predniSONE (DELTASONE) 10 MG tablet Take 10 mg by mouth 2 (two) times daily.   tiZANidine (ZANAFLEX) 2 MG tablet Take 1 tablet (2 mg total) by mouth 2 (two) times daily.   traMADol (ULTRAM) 50 MG tablet Take by mouth.   traZODone (DESYREL) 50 MG tablet Take 0.5-1 tablets (25-50 mg total) by mouth at bedtime as needed for sleep.   [DISCONTINUED] triamcinolone (KENALOG) 0.025 % cream Apply topically 2 (two) times daily. Do not use  for more than 2 days in a row.   [DISCONTINUED] gabapentin (NEURONTIN) 600 MG tablet Take 600 mg by mouth daily. Pt states he takes 300 mg in AM and 300 mg in PM   [DISCONTINUED] Melatonin 3 MG CAPS Take 2 capsules (6 mg total) by mouth at bedtime. (Patient not taking: Reported on 03/23/2021)   [DISCONTINUED] pantoprazole (PROTONIX) 40 MG tablet Take 1 tablet (40 mg total) by mouth 2 (two) times daily.   No facility-administered encounter medications on file as of 06/04/2021.    Allergies  Allergen Reactions   Tomato    Butrans [Buprenorphine]     fatigue    Review of Systems  Constitutional:  Positive for fatigue. Negative for activity change, appetite change, chills, diaphoresis, fever and unexpected weight change.  Respiratory:  Negative for cough and shortness of breath.   Cardiovascular:  Negative for chest pain, palpitations and leg swelling.  Gastrointestinal:  Negative for abdominal pain.  Musculoskeletal:  Positive for arthralgias, back pain, gait problem and joint swelling. Negative for myalgias, neck pain and neck stiffness.  Skin:  Positive for color change and rash.  Psychiatric/Behavioral:  Positive for sleep disturbance. Negative for suicidal ideas.   All other systems reviewed and are negative.      Objective:  BP (!) 142/72    Pulse 73    Temp 97.8 F (36.6 C) (Temporal)    Ht 5\' 11"  (1.803 m)    Wt 190 lb (86.2 kg)    BMI 26.50 kg/m    Wt Readings from Last 3 Encounters:  06/04/21 190 lb (86.2 kg)  04/23/21 162 lb (73.5 kg)  03/23/21 163 lb (73.9 kg)    Physical Exam Vitals and nursing note reviewed.  Constitutional:      General: He is not in acute distress.    Appearance: Normal appearance. He is not ill-appearing, toxic-appearing or diaphoretic.  HENT:     Head: Normocephalic and atraumatic.  Eyes:     Pupils: Pupils are equal, round, and reactive to light.  Cardiovascular:     Rate and Rhythm: Normal rate and regular rhythm.     Heart sounds:  Normal heart sounds.  Pulmonary:     Effort: Pulmonary effort is normal.     Breath sounds: Normal breath sounds.  Skin:    General: Skin is warm and dry.     Capillary Refill: Capillary refill takes less than 2 seconds.     Findings: Rash present.     Comments: Erythematous, scaling rash to bilateral nasolabial folds.   Neurological:     General: No  focal deficit present.     Mental Status: He is alert and oriented to person, place, and time.     Gait: Gait abnormal (antalgic).  Psychiatric:        Mood and Affect: Mood normal.        Behavior: Behavior normal.        Thought Content: Thought content normal.        Judgment: Judgment normal.    Results for orders placed or performed in visit on 01/26/21  Lactate dehydrogenase (LDH)  Result Value Ref Range   LDH 104 98 - 192 U/L  QIG  (Quant. immunoglobulins  - IgG, IgA, IgM)  Result Value Ref Range   IgG (Immunoglobin G), Serum 2,328 (H) 603 - 1,613 mg/dL   IgA 152 90 - 386 mg/dL   IgM (Immunoglobulin M), Srm 244 (H) 20 - 172 mg/dL  Beta 2 microglobulin  Result Value Ref Range   Beta-2 Microglobulin 1.6 0.6 - 2.4 mg/L  Kappa/lambda light chains  Result Value Ref Range   Kappa free light chain 33.3 (H) 3.3 - 19.4 mg/L   Lambda free light chains 21.9 5.7 - 26.3 mg/L   Kappa, lambda light chain ratio 1.52 0.26 - 1.65  Folate  Result Value Ref Range   Folate 4.0 (L) >5.9 ng/mL  Vitamin B12  Result Value Ref Range   Vitamin B-12 193 180 - 914 pg/mL  Iron and TIBC  Result Value Ref Range   Iron 35 (L) 42 - 163 ug/dL   TIBC 340 202 - 409 ug/dL   Saturation Ratios 10 (L) 20 - 55 %   UIBC 305 117 - 376 ug/dL  Ferritin  Result Value Ref Range   Ferritin 56 24 - 336 ng/mL  CMP (Cancer Center only)  Result Value Ref Range   Sodium 136 135 - 145 mmol/L   Potassium 3.7 3.5 - 5.1 mmol/L   Chloride 104 98 - 111 mmol/L   CO2 24 22 - 32 mmol/L   Glucose, Bld 115 (H) 70 - 99 mg/dL   BUN 12 6 - 20 mg/dL   Creatinine 0.97 0.61  - 1.24 mg/dL   Calcium 8.9 8.9 - 10.3 mg/dL   Total Protein 8.3 (H) 6.5 - 8.1 g/dL   Albumin 3.1 (L) 3.5 - 5.0 g/dL   AST 13 (L) 15 - 41 U/L   ALT 15 0 - 44 U/L   Alkaline Phosphatase 106 38 - 126 U/L   Total Bilirubin <0.2 (L) 0.3 - 1.2 mg/dL   GFR, Estimated >60 >60 mL/min   Anion gap 8 5 - 15  CBC with Differential (Can[facility] Center Only)  Result Value Ref Range   WBC Count 10.8 (H) 4.0 - 10.5 K/uL   RBC 4.80 4.22 - 5.81 MIL/uL   Hemoglobin 10.2 (L) 13.0 - 17.0 g/dL   HCT 34.7 (L) 39.0 - 52.0 %   MCV 72.3 (L) 80.0 - 100.0 fL   MCH 21.3 (L) 26.0 - 34.0 pg   MCHC 29.4 (L) 30.0 - 36.0 g/dL   RDW 23.6 (H) 11.5 - 15.5 %   Platelet Count 644 (H) 150 - 400 K/uL   nRBC 0.0 0.0 - 0.2 %   Neutrophils Relative % 82 %   Neutro Abs 8.8 (H) 1.7 - 7.7 K/uL   Lymphocytes Relative 15 %   Lymphs Abs 1.7 0.7 - 4.0 K/uL   Monocytes Relative 2 %   Monocytes Absolute 0.3 0.1 - 1.0 K/uL   Eosinophils  Relative 0 %   Eosinophils Absolute 0.0 0.0 - 0.5 K/uL   Basophils Relative 1 %   Basophils Absolute 0.1 0.0 - 0.1 K/uL   Immature Granulocytes 0 %   Abs Immature Granulocytes 0.03 0.00 - 0.07 K/uL       Pertinent labs & imaging results that were available during my care of the patient were reviewed by me and considered in my medical decision making.  Assessment & Plan:  Nil was seen today for medication refill.  Diagnoses and all orders for this visit:  Facial eczema Symptomatic care discussed in detail. Will trial Eucrisa as prescribed. Report any new, worsening, or persistent symptoms.  -     Crisaborole (EUCRISA) 2 % OINT; Apply 1 application topically daily.  Primary insomnia Has tried melatonin without relief of insomnia. Will trail Trazodone. Sleep hygiene discussed in detail. Report any new, worsening, or persistent symptoms.  -     traZODone (DESYREL) 50 MG tablet; Take 0.5-1 tablets (25-50 mg total) by mouth at bedtime as needed for sleep.     Continue all other maintenance  medications.  Follow up plan: Return in about 3 months (around 09/01/2021), or if symptoms worsen or fail to improve.   Continue healthy lifestyle choices, including diet (rich in fruits, vegetables, and lean proteins, and low in salt and simple carbohydrates) and exercise (at least 30 minutes of moderate physical activity daily).  Educational handout given for eczema  The above assessment and management plan was discussed with the patient. The patient verbalized understanding of and has agreed to the management plan. Patient is aware to call the clinic if they develop any new symptoms or if symptoms persist or worsen. Patient is aware when to return to the clinic for a follow-up visit. Patient educated on when it is appropriate to go to the emergency department.   Monia Pouch, FNP-C [facility] [phone]

## 2021-06-09 NOTE — Telephone Encounter (Signed)
Reminder letter mailed for pt to call to reschedule repeat colonoscopy and PV.

## 2021-06-22 ENCOUNTER — Telehealth: Payer: Self-pay | Admitting: Family Medicine

## 2021-06-23 ENCOUNTER — Other Ambulatory Visit: Payer: Self-pay | Admitting: Family Medicine

## 2021-06-23 DIAGNOSIS — F5101 Primary insomnia: Secondary | ICD-10-CM

## 2021-06-23 MED ORDER — TRAZODONE HCL 100 MG PO TABS: 100.0000 mg | ORAL_TABLET | Freq: Every evening | ORAL | 1 refills | Status: DC | PRN

## 2021-06-23 NOTE — Telephone Encounter (Signed)
RX sent to pharmacy  

## 2021-07-15 ENCOUNTER — Encounter: Payer: Self-pay | Admitting: Internal Medicine

## 2021-07-22 DIAGNOSIS — M545 Low back pain, unspecified: Secondary | ICD-10-CM | POA: Diagnosis not present

## 2021-07-22 DIAGNOSIS — Z79899 Other long term (current) drug therapy: Secondary | ICD-10-CM | POA: Diagnosis not present

## 2021-07-22 DIAGNOSIS — M62838 Other muscle spasm: Secondary | ICD-10-CM | POA: Diagnosis not present

## 2021-07-23 ENCOUNTER — Encounter: Payer: Self-pay | Admitting: Family Medicine

## 2021-07-23 ENCOUNTER — Ambulatory Visit (INDEPENDENT_AMBULATORY_CARE_PROVIDER_SITE_OTHER): Payer: PPO | Admitting: Family Medicine

## 2021-07-23 VITALS — BP 111/63 | HR 74 | Temp 97.6°F | Ht 71.0 in | Wt 178.2 lb

## 2021-07-23 DIAGNOSIS — L309 Dermatitis, unspecified: Secondary | ICD-10-CM | POA: Diagnosis not present

## 2021-07-23 NOTE — Progress Notes (Signed)
?  ? ?Subjective:  ?Patient ID: Erik Shaffer, male    DOB: July 31, 1965, 56 y.o.   MRN: [id number] ? ?Patient Care Team: ?Baruch Gouty, FNP as PCP - General (Family Medicine) ?Latanya Maudlin, MD (Orthopedic Surgery)  ? ?Chief Complaint:  Mass (Right side of neck) ? ? ?HPI: ?Erik Shaffer is a 56 y.o. male presenting on 07/23/2021 for Mass (Right side of neck) ? ? ?Patient presents today for evaluation mass that was on his neck yesterday.  States this has since resolved.  He would also like to discuss his facial eczema.  He was prescribed Eucrisa and this was denied by his insurance and he could not afford to pay for.  Prior authorization has been filled out for medication but not approved.  His eczema has been an ongoing problem for him for several years.  Mostly around his nasolabial folds and chin.  He has tried over-the-counter topicals and lotions without resolution. ? ? ? ? ?Relevant past medical, surgical, family, and social history reviewed and updated as indicated.  ?Allergies and medications reviewed and updated. Data reviewed: Chart in Epic. ? ? ?Past Medical History:  ?Diagnosis Date  ? Arthritis   ? Depression   ? Ulcer   ? ? ?Past Surgical History:  ?Procedure Laterality Date  ? HEMILAMINOTOMY LUMBAR SPINE  06/24/2010  ? MICRODISCECTOMY LUMBAR    ? L5-S1  ? SPINE SURGERY  02/2011  ? Dr. Sherley Bounds   ? ? ?Social History  ? ?Socioeconomic History  ? Marital status: Divorced  ?  Spouse name: Not on file  ? Number of children: 0  ? Years of education: Not on file  ? Highest education level: Not on file  ?Occupational History  ? Occupation: not working  ?Tobacco Use  ? Smoking status: Every Day  ?  Packs/day: 1.00  ?  Years: 30.00  ?  Pack years: 30.00  ?  Types: Cigarettes  ? Smokeless tobacco: Never  ?Vaping Use  ? Vaping Use: Never used  ?Substance and Sexual Activity  ? Alcohol use: No  ?  Comment: nothing in 20 plus years  ? Drug use: No  ? Sexual activity: Yes  ?Other Topics Concern  ? Not on file   ?Social History Narrative  ? Caffienated drinks-yes  ? Seat belt use often-yes  ? Regular Exercise-no  ? Smoke alarm in the home-yes  ? Firearms/guns in the home-yes  ? History of physical abuse-no  ? HLE-12th grade  ? Lives with sister and her husband in a one story home.  Has no children.  Not married. Currently trying to get disability.  ?   ?   ?   ? ?Social Determinants of Health  ? ?Financial Resource Strain: Low Risk   ? Difficulty of Paying Living Expenses: Not hard at all  ?Food Insecurity: No Food Insecurity  ? Worried About Charity fundraiser in the Last Year: Never true  ? Ran Out of Food in the Last Year: Never true  ?Transportation Needs: No Transportation Needs  ? Lack of Transportation (Medical): No  ? Lack of Transportation (Non-Medical): No  ?Physical Activity: Insufficiently Active  ? Days of Exercise per Week: 3 days  ? Minutes of Exercise per Session: 30 min  ?Stress: No Stress Concern Present  ? Feeling of Stress : Not at all  ?Social Connections: Socially Isolated  ? Frequency of Communication with Friends and Family: More than three times a week  ? Frequency of  Social Gatherings with Friends and Family: More than three times a week  ? Attends Religious Services: Never  ? Active Member of Clubs or Organizations: No  ? Attends Archivist Meetings: Never  ? Marital Status: Divorced  ?Intimate Partner Violence: Not At Risk  ? Fear of Current or Ex-Partner: No  ? Emotionally Abused: No  ? Physically Abused: No  ? Sexually Abused: No  ? ? ?Outpatient Encounter Medications as of 07/23/2021  ?Medication Sig  ? Crisaborole (EUCRISA) 2 % OINT Apply 1 application topically daily.  ? predniSONE (DELTASONE) 10 MG tablet Take 10 mg by mouth 2 (two) times daily.  ? tiZANidine (ZANAFLEX) 2 MG tablet Take 1 tablet (2 mg total) by mouth 2 (two) times daily.  ? traMADol (ULTRAM) 50 MG tablet Take by mouth.  ? traZODone (DESYREL) 100 MG tablet Take 1 tablet (100 mg total) by mouth at bedtime as needed  for sleep.  ? ?No facility-administered encounter medications on file as of 07/23/2021.  ? ? ?Allergies  ?Allergen Reactions  ? Tomato   ? Butrans [Buprenorphine]   ?  fatigue  ? ? ?Review of Systems  ?Constitutional:  Negative for activity change, appetite change, chills, diaphoresis, fatigue, fever and unexpected weight change.  ?Respiratory:  Negative for cough and shortness of breath.   ?Cardiovascular:  Negative for chest pain, palpitations and leg swelling.  ?Gastrointestinal:  Negative for abdominal pain.  ?Genitourinary:  Negative for decreased urine volume and difficulty urinating.  ?Musculoskeletal:  Positive for arthralgias, back pain, gait problem and myalgias. Negative for joint swelling, neck pain and neck stiffness.  ?Skin:  Positive for rash.  ?Neurological:  Negative for weakness and headaches.  ?Psychiatric/Behavioral:  Negative for confusion.   ?All other systems reviewed and are negative. ? ?   ? ?Objective:  ?BP 111/63   Pulse 74   Temp 97.6 ?F (36.4 ?C)   Ht '5\' 11"'$  (1.803 m)   Wt 178 lb 3.2 oz (80.8 kg)   SpO2 96%   BMI 24.85 kg/m?   ? ?Wt Readings from Last 3 Encounters:  ?07/23/21 178 lb 3.2 oz (80.8 kg)  ?06/04/21 190 lb (86.2 kg)  ?04/23/21 162 lb (73.5 kg)  ? ? ?Physical Exam ?Vitals and nursing note reviewed.  ?Constitutional:   ?   General: He is not in acute distress. ?   Appearance: Normal appearance. He is not ill-appearing, toxic-appearing or diaphoretic.  ?HENT:  ?   Head: Normocephalic and atraumatic.  ?Eyes:  ?   Pupils: Pupils are equal, round, and reactive to light.  ?Cardiovascular:  ?   Rate and Rhythm: Normal rate and regular rhythm.  ?Pulmonary:  ?   Effort: Pulmonary effort is normal.  ?   Breath sounds: Normal breath sounds.  ?Musculoskeletal:  ?   Cervical back: Normal range of motion and neck supple.  ?   Right lower leg: No edema.  ?   Left lower leg: No edema.  ?Lymphadenopathy:  ?   Cervical: No cervical adenopathy.  ?Skin: ?   General: Skin is warm and dry.  ?    Capillary Refill: Capillary refill takes less than 2 seconds.  ?   Findings: Rash present.  ?   Comments: Erythematous, scaling rash to bilateral nasolabial folds.  ?Neurological:  ?   General: No focal deficit present.  ?   Mental Status: He is alert and oriented to person, place, and time.  ?   Gait: Gait abnormal (antalgic).  ?Psychiatric:     ?  Mood and Affect: Mood normal.     ?   Behavior: Behavior normal.     ?   Thought Content: Thought content normal.     ?   Judgment: Judgment normal.  ? ? ?Results for orders placed or performed in visit on 01/26/21  ?Lactate dehydrogenase (LDH)  ?Result Value Ref Range  ? LDH 104 98 - 192 U/L  ?QIG  (Quant. immunoglobulins  - IgG, IgA, IgM)  ?Result Value Ref Range  ? IgG (Immunoglobin G), Serum 2,328 (H) 603 - 1,613 mg/dL  ? IgA 152 90 - 386 mg/dL  ? IgM (Immunoglobulin M), Srm 244 (H) 20 - 172 mg/dL  ?Beta 2 microglobulin  ?Result Value Ref Range  ? Beta-2 Microglobulin 1.6 0.6 - 2.4 mg/L  ?Kappa/lambda light chains  ?Result Value Ref Range  ? Kappa free light chain 33.3 (H) 3.3 - 19.4 mg/L  ? Lambda free light chains 21.9 5.7 - 26.3 mg/L  ? Kappa, lambda light chain ratio 1.52 0.26 - 1.65  ?Folate  ?Result Value Ref Range  ? Folate 4.0 (L) >5.9 ng/mL  ?Vitamin B12  ?Result Value Ref Range  ? Vitamin B-12 193 180 - 914 pg/mL  ?Iron and TIBC  ?Result Value Ref Range  ? Iron 35 (L) 42 - 163 ug/dL  ? TIBC 340 202 - 409 ug/dL  ? Saturation Ratios 10 (L) 20 - 55 %  ? UIBC 305 117 - 376 ug/dL  ?Ferritin  ?Result Value Ref Range  ? Ferritin 56 24 - 336 ng/mL  ?CMP ([place] only)  ?Result Value Ref Range  ? Sodium 136 135 - 145 mmol/L  ? Potassium 3.7 3.5 - 5.1 mmol/L  ? Chloride 104 98 - 111 mmol/L  ? CO2 24 22 - 32 mmol/L  ? Glucose, Bld 115 (H) 70 - 99 mg/dL  ? BUN 12 6 - 20 mg/dL  ? Creatinine 0.97 0.61 - 1.24 mg/dL  ? Calcium 8.9 8.9 - 10.3 mg/dL  ? Total Protein 8.3 (H) 6.5 - 8.1 g/dL  ? Albumin 3.1 (L) 3.5 - 5.0 g/dL  ? AST 13 (L) 15 - 41 U/L  ? ALT 15 0 - 44 U/L   ? Alkaline Phosphatase 106 38 - 126 U/L  ? Total Bilirubin <0.2 (L) 0.3 - 1.2 mg/dL  ? GFR, Estimated >60 >60 mL/min  ? Anion gap 8 5 - 15  ?CBC with Differential ([place] Only)  ?Result Value Ref Range

## 2021-07-27 DIAGNOSIS — Z79899 Other long term (current) drug therapy: Secondary | ICD-10-CM | POA: Diagnosis not present

## 2021-07-30 ENCOUNTER — Other Ambulatory Visit: Payer: Self-pay | Admitting: Family Medicine

## 2021-07-30 DIAGNOSIS — M0579 Rheumatoid arthritis with rheumatoid factor of multiple sites without organ or systems involvement: Secondary | ICD-10-CM

## 2021-08-19 ENCOUNTER — Other Ambulatory Visit: Payer: Self-pay | Admitting: Family Medicine

## 2021-08-19 DIAGNOSIS — M0579 Rheumatoid arthritis with rheumatoid factor of multiple sites without organ or systems involvement: Secondary | ICD-10-CM

## 2021-08-19 NOTE — Telephone Encounter (Signed)
Pt needs the rx to be sent to [facility] on [address] in [place].  ? ?He wants this to be his permanent pharmacy as well. ?

## 2021-08-20 NOTE — Telephone Encounter (Signed)
RF for Naproxen 500 mg 1 BID #60, this is under reconciled meds, just refiled on 06/26/21 not sure why it is not on current med list. ?

## 2021-09-02 ENCOUNTER — Ambulatory Visit: Payer: PPO | Admitting: Family Medicine

## 2021-09-08 ENCOUNTER — Ambulatory Visit (INDEPENDENT_AMBULATORY_CARE_PROVIDER_SITE_OTHER): Payer: PPO | Admitting: Family Medicine

## 2021-09-08 ENCOUNTER — Encounter: Payer: Self-pay | Admitting: Family Medicine

## 2021-09-08 VITALS — BP 118/66 | HR 77 | Temp 98.8°F | Ht 71.0 in | Wt 174.0 lb

## 2021-09-08 DIAGNOSIS — D500 Iron deficiency anemia secondary to blood loss (chronic): Secondary | ICD-10-CM | POA: Diagnosis not present

## 2021-09-08 DIAGNOSIS — I10 Essential (primary) hypertension: Secondary | ICD-10-CM | POA: Diagnosis not present

## 2021-09-08 DIAGNOSIS — L309 Dermatitis, unspecified: Secondary | ICD-10-CM

## 2021-09-08 DIAGNOSIS — E782 Mixed hyperlipidemia: Secondary | ICD-10-CM

## 2021-09-08 DIAGNOSIS — G894 Chronic pain syndrome: Secondary | ICD-10-CM

## 2021-09-08 MED ORDER — TRIAMCINOLONE ACETONIDE 0.1 % EX CREA: 1.0000 "application " | TOPICAL_CREAM | Freq: Two times a day (BID) | CUTANEOUS | 0 refills | Status: DC

## 2021-09-08 MED ORDER — NAPROXEN 500 MG PO TABS: 500.0000 mg | ORAL_TABLET | Freq: Two times a day (BID) | ORAL | 3 refills | Status: DC

## 2021-09-08 NOTE — Progress Notes (Signed)
Subjective:  Patient ID: Erik Shaffer, male    DOB: 1965/06/16, 56 y.o.   MRN: [id number]  Patient Care Team: Baruch Gouty, FNP as PCP - General (Family Medicine) Latanya Maudlin, MD (Orthopedic Surgery)   Chief Complaint:  Medical Management of Chronic Issues   HPI: Erik Shaffer is a 56 y.o. male presenting on 09/08/2021 for Medical Management of Chronic Issues   1. Facial eczema Has improved greatly with Eucrisa but still has flares at times. He has used triamcinolone in the past with great results, denies facial tattooing from the steroid cream.   2. Iron deficiency anemia due to chronic blood loss Last Hgb 10.2, Hct 34.7. He is not on iron repletion therapy. Denies abnormal bleeding or bruising. No chest pain, shortness of breath, fatigue, palpitations, dizziness, or syncope. No dark or tarry stools.   3. Mixed hyperlipidemia No on statin therapy. Does try to follow a healthier diet. No regular exercise due to chronic pain.  4. Essential hypertension Currently not on medications due to low blood pressure readings. No headaches, chest pain, confusion, weakness, dizziness, or syncope.   5. Chronic pain disorder Followed by pain management and doing well with current treatment regimen.      Relevant past medical, surgical, family, and social history reviewed and updated as indicated.  Allergies and medications reviewed and updated. Data reviewed: Chart in Epic.   Past Medical History:  Diagnosis Date   Arthritis    Depression    Ulcer     Past Surgical History:  Procedure Laterality Date   HEMILAMINOTOMY LUMBAR SPINE  06/24/2010   MICRODISCECTOMY LUMBAR     L5-S1   SPINE SURGERY  02/2011   Dr. Sherley Bounds     Social History   Socioeconomic History   Marital status: Divorced    Spouse name: Not on file   Number of children: 0   Years of education: Not on file   Highest education level: Not on file  Occupational History   Occupation: not working   Tobacco Use   Smoking status: Every Day    Packs/day: 1.00    Years: 30.00    Pack years: 30.00    Types: Cigarettes   Smokeless tobacco: Never  Vaping Use   Vaping Use: Never used  Substance and Sexual Activity   Alcohol use: No    Comment: nothing in 20 plus years   Drug use: No   Sexual activity: Yes  Other Topics Concern   Not on file  Social History Narrative   Caffienated drinks-yes   Seat belt use often-yes   Regular Exercise-no   Smoke alarm in the home-yes   Firearms/guns in the home-yes   History of physical abuse-no   HLE-12th grade   Lives with sister and her husband in a one story home.  Has no children.  Not married. Currently trying to get disability.            Social Determinants of Health   Financial Resource Strain: Low Risk    Difficulty of Paying Living Expenses: Not hard at all  Food Insecurity: No Food Insecurity   Worried About Charity fundraiser in the Last Year: Never true   [place] in the Last Year: Never true  Transportation Needs: No Transportation Needs   Lack of Transportation (Medical): No   Lack of Transportation (Non-Medical): No  Physical Activity: Insufficiently Active   Days of Exercise per Week: 3 days  Minutes of Exercise per Session: 30 min  Stress: No Stress Concern Present   Feeling of Stress : Not at all  Social Connections: Socially Isolated   Frequency of Communication with Friends and Family: More than three times a week   Frequency of Social Gatherings with Friends and Family: More than three times a week   Attends Religious Services: Never   Marine scientist or Organizations: No   Attends Archivist Meetings: Never   Marital Status: Divorced  Human resources officer Violence: Not At Risk   Fear of Current or Ex-Partner: No   Emotionally Abused: No   Physically Abused: No   Sexually Abused: No    Outpatient Encounter Medications as of 09/08/2021  Medication Sig   Crisaborole (EUCRISA) 2 %  OINT Apply 1 application topically daily.   tiZANidine (ZANAFLEX) 2 MG tablet Take 1 tablet (2 mg total) by mouth 2 (two) times daily.   traMADol (ULTRAM) 50 MG tablet Take by mouth.   traZODone (DESYREL) 100 MG tablet Take 1 tablet (100 mg total) by mouth at bedtime as needed for sleep.   triamcinolone cream (KENALOG) 0.1 % Apply 1 application. topically 2 (two) times daily.   [DISCONTINUED] naproxen (NAPROSYN) 500 MG tablet Take 500 mg by mouth 2 (two) times daily.   [DISCONTINUED] predniSONE (DELTASONE) 10 MG tablet Take 10 mg by mouth 2 (two) times daily.   naproxen (NAPROSYN) 500 MG tablet Take 1 tablet (500 mg total) by mouth 2 (two) times daily.   No facility-administered encounter medications on file as of 09/08/2021.    Allergies  Allergen Reactions   Tomato    Butrans [Buprenorphine]     fatigue    Review of Systems  Constitutional:  Negative for activity change, appetite change, chills, diaphoresis, fatigue, fever and unexpected weight change.  HENT: Negative.    Eyes: Negative.  Negative for photophobia and visual disturbance.  Respiratory:  Negative for cough, chest tightness and shortness of breath.   Cardiovascular:  Negative for chest pain, palpitations and leg swelling.  Gastrointestinal:  Negative for abdominal pain, anal bleeding, blood in stool, constipation, diarrhea, nausea and vomiting.  Endocrine: Negative.   Genitourinary:  Negative for decreased urine volume, difficulty urinating, dysuria, frequency, hematuria and urgency.  Musculoskeletal:  Positive for arthralgias. Negative for myalgias.  Skin:  Positive for color change and rash. Negative for pallor and wound.  Allergic/Immunologic: Negative.   Neurological:  Negative for dizziness, tremors, seizures, syncope, facial asymmetry, speech difficulty, weakness, light-headedness, numbness and headaches.  Hematological: Negative.  Does not bruise/bleed easily.  Psychiatric/Behavioral:  Negative for confusion,  hallucinations, sleep disturbance and suicidal ideas.   All other systems reviewed and are negative.      Objective:  BP 118/66   Pulse 77   Temp 98.8 F (37.1 C)   Ht $R'5\' 11"'Dk$  (1.803 m)   Wt 174 lb (78.9 kg)   SpO2 97%   BMI 24.27 kg/m    Wt Readings from Last 3 Encounters:  09/08/21 174 lb (78.9 kg)  07/23/21 178 lb 3.2 oz (80.8 kg)  06/04/21 190 lb (86.2 kg)    Physical Exam Vitals and nursing note reviewed.  Constitutional:      General: He is not in acute distress.    Appearance: Normal appearance. He is normal weight. He is not ill-appearing, toxic-appearing or diaphoretic.  HENT:     Head: Normocephalic and atraumatic.  Eyes:     Pupils: Pupils are equal, round, and reactive to  light.  Cardiovascular:     Rate and Rhythm: Normal rate and regular rhythm.     Heart sounds: Normal heart sounds. No murmur heard.   No friction rub. No gallop.  Pulmonary:     Effort: Pulmonary effort is normal.     Breath sounds: Normal breath sounds.  Musculoskeletal:     Cervical back: Neck supple.     Right lower leg: No edema.     Left lower leg: No edema.     Comments: Decreased ROM in hands, back, arms and legs  Skin:    General: Skin is warm and dry.     Capillary Refill: Capillary refill takes less than 2 seconds.     Findings: Rash (scaling rash to left cheek) present.  Neurological:     General: No focal deficit present.     Mental Status: He is alert and oriented to person, place, and time.     Gait: Gait abnormal (antalgic, slow).    Results for orders placed or performed in visit on 01/26/21  Lactate dehydrogenase (LDH)  Result Value Ref Range   LDH 104 98 - 192 U/L  QIG  (Quant. immunoglobulins  - IgG, IgA, IgM)  Result Value Ref Range   IgG (Immunoglobin G), Serum 2,328 (H) 603 - 1,613 mg/dL   IgA 152 90 - 386 mg/dL   IgM (Immunoglobulin M), Srm 244 (H) 20 - 172 mg/dL  Beta 2 microglobulin  Result Value Ref Range   Beta-2 Microglobulin 1.6 0.6 - 2.4 mg/L   Kappa/lambda light chains  Result Value Ref Range   Kappa free light chain 33.3 (H) 3.3 - 19.4 mg/L   Lambda free light chains 21.9 5.7 - 26.3 mg/L   Kappa, lambda light chain ratio 1.52 0.26 - 1.65  Folate  Result Value Ref Range   Folate 4.0 (L) >5.9 ng/mL  Vitamin B12  Result Value Ref Range   Vitamin B-12 193 180 - 914 pg/mL  Iron and TIBC  Result Value Ref Range   Iron 35 (L) 42 - 163 ug/dL   TIBC 340 202 - 409 ug/dL   Saturation Ratios 10 (L) 20 - 55 %   UIBC 305 117 - 376 ug/dL  Ferritin  Result Value Ref Range   Ferritin 56 24 - 336 ng/mL  CMP (Cancer Center only)  Result Value Ref Range   Sodium 136 135 - 145 mmol/L   Potassium 3.7 3.5 - 5.1 mmol/L   Chloride 104 98 - 111 mmol/L   CO2 24 22 - 32 mmol/L   Glucose, Bld 115 (H) 70 - 99 mg/dL   BUN 12 6 - 20 mg/dL   Creatinine 0.97 0.61 - 1.24 mg/dL   Calcium 8.9 8.9 - 10.3 mg/dL   Total Protein 8.3 (H) 6.5 - 8.1 g/dL   Albumin 3.1 (L) 3.5 - 5.0 g/dL   AST 13 (L) 15 - 41 U/L   ALT 15 0 - 44 U/L   Alkaline Phosphatase 106 38 - 126 U/L   Total Bilirubin <0.2 (L) 0.3 - 1.2 mg/dL   GFR, Estimated >60 >60 mL/min   Anion gap 8 5 - 15  CBC with Differential (Cancer Center Only)  Result Value Ref Range   WBC Count 10.8 (H) 4.0 - 10.5 K/uL   RBC 4.80 4.22 - 5.81 MIL/uL   Hemoglobin 10.2 (L) 13.0 - 17.0 g/dL   HCT 34.7 (L) 39.0 - 52.0 %   MCV 72.3 (L) 80.0 - 100.0 fL  MCH 21.3 (L) 26.0 - 34.0 pg   MCHC 29.4 (L) 30.0 - 36.0 g/dL   RDW 23.6 (H) 11.5 - 15.5 %   Platelet Count 644 (H) 150 - 400 K/uL   nRBC 0.0 0.0 - 0.2 %   Neutrophils Relative % 82 %   Neutro Abs 8.8 (H) 1.7 - 7.7 K/uL   Lymphocytes Relative 15 %   Lymphs Abs 1.7 0.7 - 4.0 K/uL   Monocytes Relative 2 %   Monocytes Absolute 0.3 0.1 - 1.0 K/uL   Eosinophils Relative 0 %   Eosinophils Absolute 0.0 0.0 - 0.5 K/uL   Basophils Relative 1 %   Basophils Absolute 0.1 0.0 - 0.1 K/uL   Immature Granulocytes 0 %   Abs Immature Granulocytes 0.03 0.00 - 0.07  K/uL       Pertinent labs & imaging results that were available during my care of the patient were reviewed by me and considered in my medical decision making.  Assessment & Plan:  Erik Shaffer was seen today for medical management of chronic issues.  Diagnoses and all orders for this visit:  Facial eczema Continue Eucrisa. Aware to only use Kenalog for severe flares in symptoms as this can cause skin discoloration and thinning. Aware to report any new, worsening, or persistent symptoms.  -     triamcinolone cream (KENALOG) 0.1 %; Apply 1 application. topically 2 (two) times daily.  Iron deficiency anemia due to chronic blood loss Will recheck labs today.  -     CBC with Differential/Platelet  Mixed hyperlipidemia Not on statin therapy. Will check labs and discuss therapy if warranted. Diet and exercise encouraged.  -     CMP14+EGFR -     Lipid panel  Essential hypertension Not on medications due to low readings. Remains controlled. Will check labs today DASH diet and exercise encouraged.  -     CBC with Differential/Platelet -     CMP14+EGFR -     Lipid panel -     Thyroid Panel With TSH  Chronic pain disorder Followed by pain management. Needs refill of Naproxen. Will check renal function today. Aware to only take as needed for pain. Will discontinue if renal function is declined.  -     naproxen (NAPROSYN) 500 MG tablet; Take 1 tablet (500 mg total) by mouth 2 (two) times daily.     Continue all other maintenance medications.  Follow up plan: Return in about 6 months (around 03/11/2022), or if symptoms worsen or fail to improve.   Continue healthy lifestyle choices, including diet (rich in fruits, vegetables, and lean proteins, and low in salt and simple carbohydrates) and exercise (at least 30 minutes of moderate physical activity daily).  Educational handout given for eczema  The above assessment and management plan was discussed with the patient. The patient verbalized  understanding of and has agreed to the management plan. Patient is aware to call the clinic if they develop any new symptoms or if symptoms persist or worsen. Patient is aware when to return to the clinic for a follow-up visit. Patient educated on when it is appropriate to go to the emergency department.   Monia Pouch, FNP-C [facility] [phone]

## 2021-09-09 LAB — CBC WITH DIFFERENTIAL/PLATELET
Basophils Absolute: 0.1 10*3/uL (ref 0.0–0.2)
Basos: 1 %
EOS (ABSOLUTE): 0.3 10*3/uL (ref 0.0–0.4)
Eos: 3 %
Hematocrit: 41.2 % (ref 37.5–51.0)
Hemoglobin: 13.1 g/dL (ref 13.0–17.7)
Immature Grans (Abs): 0 10*3/uL (ref 0.0–0.1)
Immature Granulocytes: 0 %
Lymphocytes Absolute: 2.8 10*3/uL (ref 0.7–3.1)
Lymphs: 28 %
MCH: 25.6 pg — ABNORMAL LOW (ref 26.6–33.0)
MCHC: 31.8 g/dL (ref 31.5–35.7)
MCV: 81 fL (ref 79–97)
Monocytes Absolute: 1.1 10*3/uL — ABNORMAL HIGH (ref 0.1–0.9)
Monocytes: 11 %
Neutrophils Absolute: 5.7 10*3/uL (ref 1.4–7.0)
Neutrophils: 57 %
Platelets: 474 10*3/uL — ABNORMAL HIGH (ref 150–450)
RBC: 5.11 x10E6/uL (ref 4.14–5.80)
RDW: 14.8 % (ref 11.6–15.4)
WBC: 10 10*3/uL (ref 3.4–10.8)

## 2021-09-09 LAB — THYROID PANEL WITH TSH
Free Thyroxine Index: 2.6 (ref 1.2–4.9)
T3 Uptake Ratio: 33 % (ref 24–39)
T4, Total: 7.9 ug/dL (ref 4.5–12.0)
TSH: 1.55 u[IU]/mL (ref 0.450–4.500)

## 2021-09-09 LAB — CMP14+EGFR
ALT: 14 IU/L (ref 0–44)
AST: 20 IU/L (ref 0–40)
Albumin/Globulin Ratio: 1.1 — ABNORMAL LOW (ref 1.2–2.2)
Albumin: 4 g/dL (ref 3.8–4.9)
Alkaline Phosphatase: 115 IU/L (ref 44–121)
BUN/Creatinine Ratio: 8 — ABNORMAL LOW (ref 9–20)
BUN: 7 mg/dL (ref 6–24)
Bilirubin Total: 0.2 mg/dL (ref 0.0–1.2)
CO2: 24 mmol/L (ref 20–29)
Calcium: 9.2 mg/dL (ref 8.7–10.2)
Chloride: 100 mmol/L (ref 96–106)
Creatinine, Ser: 0.88 mg/dL (ref 0.76–1.27)
Globulin, Total: 3.7 g/dL (ref 1.5–4.5)
Glucose: 82 mg/dL (ref 70–99)
Potassium: 4.1 mmol/L (ref 3.5–5.2)
Sodium: 139 mmol/L (ref 134–144)
Total Protein: 7.7 g/dL (ref 6.0–8.5)
eGFR: 102 mL/min/{1.73_m2} (ref >59)

## 2021-09-09 LAB — LIPID PANEL
Chol/HDL Ratio: 6.3 ratio — ABNORMAL HIGH (ref 0.0–5.0)
Cholesterol, Total: 220 mg/dL — ABNORMAL HIGH (ref 100–199)
HDL: 35 mg/dL — ABNORMAL LOW (ref >39)
LDL Chol Calc (NIH): 158 mg/dL — ABNORMAL HIGH (ref 0–99)
Triglycerides: 148 mg/dL (ref 0–149)
VLDL Cholesterol Cal: 27 mg/dL (ref 5–40)

## 2021-09-21 DIAGNOSIS — Z91148 Patient's other noncompliance with medication regimen for other reason: Secondary | ICD-10-CM | POA: Diagnosis not present

## 2021-09-21 DIAGNOSIS — Z79899 Other long term (current) drug therapy: Secondary | ICD-10-CM | POA: Diagnosis not present

## 2021-09-21 DIAGNOSIS — M62838 Other muscle spasm: Secondary | ICD-10-CM | POA: Diagnosis not present

## 2021-09-21 DIAGNOSIS — M545 Low back pain, unspecified: Secondary | ICD-10-CM | POA: Diagnosis not present

## 2021-11-02 ENCOUNTER — Other Ambulatory Visit: Payer: Self-pay

## 2021-11-02 DIAGNOSIS — F5101 Primary insomnia: Secondary | ICD-10-CM

## 2021-11-02 MED ORDER — TRAZODONE HCL 100 MG PO TABS: 100.0000 mg | ORAL_TABLET | Freq: Every evening | ORAL | 1 refills | Status: DC | PRN

## 2021-11-02 NOTE — Telephone Encounter (Signed)
Last office visit 09/08/21, instructed to return in 6 months Last refill 06/23/21, #90, 1 refill

## 2021-11-19 DIAGNOSIS — Z79899 Other long term (current) drug therapy: Secondary | ICD-10-CM | POA: Diagnosis not present

## 2021-11-19 DIAGNOSIS — M62838 Other muscle spasm: Secondary | ICD-10-CM | POA: Diagnosis not present

## 2021-11-19 DIAGNOSIS — M545 Low back pain, unspecified: Secondary | ICD-10-CM | POA: Diagnosis not present

## 2021-11-22 DIAGNOSIS — Z79899 Other long term (current) drug therapy: Secondary | ICD-10-CM | POA: Diagnosis not present

## 2021-12-15 ENCOUNTER — Other Ambulatory Visit: Payer: Self-pay | Admitting: Family Medicine

## 2021-12-15 DIAGNOSIS — F5101 Primary insomnia: Secondary | ICD-10-CM

## 2022-01-01 ENCOUNTER — Other Ambulatory Visit: Payer: Self-pay | Admitting: Family Medicine

## 2022-01-01 ENCOUNTER — Telehealth: Payer: Self-pay | Admitting: Family Medicine

## 2022-01-01 DIAGNOSIS — F5101 Primary insomnia: Secondary | ICD-10-CM

## 2022-01-01 MED ORDER — TRAZODONE HCL 100 MG PO TABS: 200.0000 mg | ORAL_TABLET | Freq: Every evening | ORAL | 1 refills | Status: DC | PRN

## 2022-01-01 NOTE — Telephone Encounter (Signed)
Patient is unable to get refills for traZODone (DESYREL) 100 MG tablet because the prescription was written for 1 a day and he said he talked to [facility] about taking 2 a day if he needed. He ran out yesterday (9/14) and prescription changed and resent to [facility] in [place].

## 2022-01-04 NOTE — Telephone Encounter (Signed)
Called and notified patient.

## 2022-01-05 ENCOUNTER — Other Ambulatory Visit: Payer: Self-pay | Admitting: Family Medicine

## 2022-01-05 DIAGNOSIS — G894 Chronic pain syndrome: Secondary | ICD-10-CM

## 2022-01-14 DIAGNOSIS — M62838 Other muscle spasm: Secondary | ICD-10-CM | POA: Diagnosis not present

## 2022-01-14 DIAGNOSIS — G894 Chronic pain syndrome: Secondary | ICD-10-CM | POA: Diagnosis not present

## 2022-01-14 DIAGNOSIS — M545 Low back pain, unspecified: Secondary | ICD-10-CM | POA: Diagnosis not present

## 2022-01-14 DIAGNOSIS — Z91148 Patient's other noncompliance with medication regimen for other reason: Secondary | ICD-10-CM | POA: Diagnosis not present

## 2022-03-09 ENCOUNTER — Ambulatory Visit: Payer: PPO | Admitting: Family Medicine

## 2022-03-19 ENCOUNTER — Other Ambulatory Visit (HOSPITAL_COMMUNITY): Payer: Self-pay

## 2022-03-19 ENCOUNTER — Encounter: Payer: Self-pay | Admitting: Family Medicine

## 2022-03-19 ENCOUNTER — Ambulatory Visit (INDEPENDENT_AMBULATORY_CARE_PROVIDER_SITE_OTHER): Payer: PPO | Admitting: Family Medicine

## 2022-03-19 VITALS — BP 130/76 | HR 81 | Temp 97.6°F | Ht 71.0 in | Wt 186.0 lb

## 2022-03-19 DIAGNOSIS — E559 Vitamin D deficiency, unspecified: Secondary | ICD-10-CM | POA: Diagnosis not present

## 2022-03-19 DIAGNOSIS — I10 Essential (primary) hypertension: Secondary | ICD-10-CM

## 2022-03-19 DIAGNOSIS — R110 Nausea: Secondary | ICD-10-CM | POA: Diagnosis not present

## 2022-03-19 DIAGNOSIS — M0579 Rheumatoid arthritis with rheumatoid factor of multiple sites without organ or systems involvement: Secondary | ICD-10-CM | POA: Diagnosis not present

## 2022-03-19 DIAGNOSIS — K219 Gastro-esophageal reflux disease without esophagitis: Secondary | ICD-10-CM

## 2022-03-19 DIAGNOSIS — R112 Nausea with vomiting, unspecified: Secondary | ICD-10-CM

## 2022-03-19 DIAGNOSIS — G4701 Insomnia due to medical condition: Secondary | ICD-10-CM

## 2022-03-19 DIAGNOSIS — E782 Mixed hyperlipidemia: Secondary | ICD-10-CM | POA: Diagnosis not present

## 2022-03-19 MED ORDER — ONDANSETRON 4 MG PO TBDP: 4.0000 mg | ORAL_TABLET | Freq: Three times a day (TID) | ORAL | 0 refills | Status: DC | PRN

## 2022-03-19 NOTE — Progress Notes (Signed)
Subjective:  Patient ID: Erik Shaffer, male    DOB: Jul 04, 1965, 56 y.o.   MRN: [id number]  Patient Care Team: Baruch Gouty, FNP as PCP - General (Family Medicine) Latanya Maudlin, MD (Orthopedic Surgery)   Chief Complaint:  Medical Management of Chronic Issues (6 month follow up )   HPI: Erik Shaffer is a 56 y.o. male presenting on 03/19/2022 for Medical Management of Chronic Issues (6 month follow up )   1. Essential hypertension Diet controlled. Denies headache, chest pain, leg swelling, visual changes, weakness, confusion, or syncope. Does not check BP at home  2. Rheumatoid arthritis involving multiple sites with positive rheumatoid factor ([place]) Followed by pain management on a regular basis and reports great control on current regimen.   3. Gastroesophageal reflux disease without esophagitis Takes as needed tums or pepcid with great control of symptoms. States he did get some kind of GI bug over Thanksgiving and had some nausea and vomiting for a few days. States the vomiting has subsided but he does have some nausea at times.  4. Vitamin D deficiency Not on repletion therapy. Denies difficulty walking, no recent fractures. Does have chronic arthralgias.   5. Insomnia due to medical condition Does very well with current regimen. States most nights he only takes 100 mg of trazodone, rarely needs 200 mg. States not daytime brain fog or fatigue with current regimen.   6. Mixed hyperlipidemia No on statin therapy. Does not follow a strict diet or exercise routine.    Relevant past medical, surgical, family, and social history reviewed and updated as indicated.  Allergies and medications reviewed and updated. Data reviewed: Chart in Epic.   Past Medical History:  Diagnosis Date   Arthritis    Depression    Ulcer     Past Surgical History:  Procedure Laterality Date   HEMILAMINOTOMY LUMBAR SPINE  06/24/2010   MICRODISCECTOMY LUMBAR     L5-S1   SPINE SURGERY   02/2011   Dr. Sherley Bounds     Social History   Socioeconomic History   Marital status: Divorced    Spouse name: Not on file   Number of children: 0   Years of education: Not on file   Highest education level: Not on file  Occupational History   Occupation: not working  Tobacco Use   Smoking status: Every Day    Packs/day: 1.00    Years: 30.00    Total pack years: 30.00    Types: Cigarettes   Smokeless tobacco: Never  Vaping Use   Vaping Use: Never used  Substance and Sexual Activity   Alcohol use: No    Comment: nothing in 20 plus years   Drug use: No   Sexual activity: Yes  Other Topics Concern   Not on file  Social History Narrative   Caffienated drinks-yes   Seat belt use often-yes   Regular Exercise-no   Smoke alarm in the home-yes   Firearms/guns in the home-yes   History of physical abuse-no   HLE-12th grade   Lives with sister and her husband in a one story home.  Has no children.  Not married. Currently trying to get disability.            Social Determinants of Health   Financial Resource Strain: Low Risk  (03/23/2021)   Overall Financial Resource Strain (CARDIA)    Difficulty of Paying Living Expenses: Not hard at all  Food Insecurity: No Food Insecurity (03/23/2021)  Hunger Vital Sign    Worried About Running Out of Food in the Last Year: Never true    Ran Out of Food in the Last Year: Never true  Transportation Needs: No Transportation Needs (03/23/2021)   PRAPARE - Hydrologist (Medical): No    Lack of Transportation (Non-Medical): No  Physical Activity: Insufficiently Active (03/23/2021)   Exercise Vital Sign    Days of Exercise per Week: 3 days    Minutes of Exercise per Session: 30 min  Stress: No Stress Concern Present (03/23/2021)   [place]    Feeling of Stress : Not at all  Social Connections: Socially Isolated (03/23/2021)   Social Connection  and Isolation Panel [NHANES]    Frequency of Communication with Friends and Family: More than three times a week    Frequency of Social Gatherings with Friends and Family: More than three times a week    Attends Religious Services: Never    Marine scientist or Organizations: No    Attends Archivist Meetings: Never    Marital Status: Divorced  Human resources officer Violence: Not At Risk (03/23/2021)   Humiliation, Afraid, Rape, and Kick questionnaire    Fear of Current or Ex-Partner: No    Emotionally Abused: No    Physically Abused: No    Sexually Abused: No    Outpatient Encounter Medications as of 03/19/2022  Medication Sig   Crisaborole (EUCRISA) 2 % OINT Apply 1 application topically daily.   naproxen (NAPROSYN) 500 MG tablet TAKE 1 TABLET BY MOUTH TWICE A DAY   ondansetron (ZOFRAN-ODT) 4 MG disintegrating tablet Take 1 tablet (4 mg total) by mouth every 8 (eight) hours as needed for nausea or vomiting.   tiZANidine (ZANAFLEX) 2 MG tablet Take 1 tablet (2 mg total) by mouth 2 (two) times daily.   traMADol (ULTRAM) 50 MG tablet Take by mouth.   traZODone (DESYREL) 100 MG tablet Take 2 tablets (200 mg total) by mouth at bedtime as needed for sleep.   triamcinolone cream (KENALOG) 0.1 % Apply 1 application. topically 2 (two) times daily.   No facility-administered encounter medications on file as of 03/19/2022.    Allergies  Allergen Reactions   Tomato    Butrans [Buprenorphine]     fatigue    Review of Systems  Constitutional:  Negative for activity change, appetite change, chills, diaphoresis, fatigue, fever and unexpected weight change.  HENT: Negative.    Eyes: Negative.  Negative for photophobia and visual disturbance.  Respiratory:  Negative for cough, chest tightness and shortness of breath.   Cardiovascular:  Negative for chest pain, palpitations and leg swelling.  Gastrointestinal:  Positive for nausea. Negative for abdominal distention, abdominal pain,  anal bleeding, blood in stool, constipation, diarrhea, rectal pain and vomiting.  Endocrine: Negative.  Negative for polydipsia, polyphagia and polyuria.  Genitourinary:  Negative for decreased urine volume, difficulty urinating, dysuria, frequency and urgency.  Musculoskeletal:  Positive for arthralgias. Negative for back pain, gait problem, joint swelling, myalgias, neck pain and neck stiffness.  Skin:  Positive for rash. Negative for color change, pallor and wound.  Allergic/Immunologic: Negative.   Neurological:  Negative for dizziness, tremors, seizures, syncope, facial asymmetry, speech difficulty, weakness, light-headedness, numbness and headaches.  Hematological: Negative.   Psychiatric/Behavioral:  Negative for confusion, hallucinations, sleep disturbance and suicidal ideas.   All other systems reviewed and are negative.       Objective:  BP 130/76   Pulse 81   Temp 97.6 F (36.4 C) (Temporal)   Ht _0  (1.803 m)   Wt 186 lb (84.4 kg)   SpO2 97%   BMI 25.94 kg/m    Wt Readings from Last 3 Encounters:  03/19/22 186 lb (84.4 kg)  09/08/21 174 lb (78.9 kg)  07/23/21 178 lb 3.2 oz (80.8 kg)    Physical Exam Vitals and nursing note reviewed.  Constitutional:      General: He is not in acute distress.    Appearance: Normal appearance. He is well-developed and well-groomed. He is not ill-appearing, toxic-appearing or diaphoretic.  HENT:     Head: Normocephalic and atraumatic.     Jaw: There is normal jaw occlusion.     Right Ear: Hearing normal.     Left Ear: Hearing normal.     Nose: Nose normal.     Mouth/Throat:     Lips: Pink.     Mouth: Mucous membranes are moist.     Pharynx: Uvula midline.  Eyes:     General: Lids are normal.     Pupils: Pupils are equal, round, and reactive to light.  Neck:     Thyroid: No thyroid mass, thyromegaly or thyroid tenderness.     Vascular: No carotid bruit or JVD.     Trachea: Trachea and phonation normal.  Cardiovascular:      Rate and Rhythm: Normal rate and regular rhythm.     Chest Wall: PMI is not displaced.  Pulmonary:     Effort: Pulmonary effort is normal.  Abdominal:     General: Bowel sounds are normal. There is no abdominal bruit.     Palpations: There is no hepatomegaly or splenomegaly.  Musculoskeletal:     Cervical back: Normal range of motion and neck supple.     Right lower leg: No edema.     Left lower leg: No edema.     Comments: Decreased ROM in hands and back  Lymphadenopathy:     Cervical: No cervical adenopathy.  Skin:    General: Skin is warm and dry.     Capillary Refill: Capillary refill takes less than 2 seconds.     Coloration: Skin is not cyanotic, jaundiced or pale.     Findings: No rash.     Comments: Dry, scaly rash to elbows and hands  Neurological:     General: No focal deficit present.     Mental Status: He is alert and oriented to person, place, and time.     Sensory: Sensation is intact.     Motor: Motor function is intact.     Coordination: Coordination is intact.     Gait: Gait is intact.     Deep Tendon Reflexes: Reflexes are normal and symmetric.  Psychiatric:        Attention and Perception: Attention and perception normal.        Mood and Affect: Mood and affect normal.        Speech: Speech normal.        Behavior: Behavior normal. Behavior is cooperative.        Thought Content: Thought content normal.        Cognition and Memory: Cognition and memory normal.        Judgment: Judgment normal.     Results for orders placed or performed in visit on 09/08/21  CBC with Differential/Platelet  Result Value Ref Range   WBC 10.0 3.4 - 10.8 x10E3/uL  RBC 5.11 4.14 - 5.80 x10E6/uL   Hemoglobin 13.1 13.0 - 17.7 g/dL   Hematocrit 41.2 37.5 - 51.0 %   MCV 81 79 - 97 fL   MCH 25.6 (L) 26.6 - 33.0 pg   MCHC 31.8 31.5 - 35.7 g/dL   RDW 14.8 11.6 - 15.4 %   Platelets 474 (H) 150 - 450 x10E3/uL   Neutrophils 57 Not Estab. %   Lymphs 28 Not Estab. %    Monocytes 11 Not Estab. %   Eos 3 Not Estab. %   Basos 1 Not Estab. %   Neutrophils Absolute 5.7 1.4 - 7.0 x10E3/uL   Lymphocytes Absolute 2.8 0.7 - 3.1 x10E3/uL   Monocytes Absolute 1.1 (H) 0.1 - 0.9 x10E3/uL   EOS (ABSOLUTE) 0.3 0.0 - 0.4 x10E3/uL   Basophils Absolute 0.1 0.0 - 0.2 x10E3/uL   Immature Granulocytes 0 Not Estab. %   Immature Grans (Abs) 0.0 0.0 - 0.1 x10E3/uL  CMP14+EGFR  Result Value Ref Range   Glucose 82 70 - 99 mg/dL   BUN 7 6 - 24 mg/dL   Creatinine, Ser 0.88 0.76 - 1.27 mg/dL   eGFR 102 >59 mL/min/1.73   BUN/Creatinine Ratio 8 (L) 9 - 20   Sodium 139 134 - 144 mmol/L   Potassium 4.1 3.5 - 5.2 mmol/L   Chloride 100 96 - 106 mmol/L   CO2 24 20 - 29 mmol/L   Calcium 9.2 8.7 - 10.2 mg/dL   Total Protein 7.7 6.0 - 8.5 g/dL   Albumin 4.0 3.8 - 4.9 g/dL   Globulin, Total 3.7 1.5 - 4.5 g/dL   Albumin/Globulin Ratio 1.1 (L) 1.2 - 2.2   Bilirubin Total 0.2 0.0 - 1.2 mg/dL   Alkaline Phosphatase 115 44 - 121 IU/L   AST 20 0 - 40 IU/L   ALT 14 0 - 44 IU/L  Lipid panel  Result Value Ref Range   Cholesterol, Total 220 (H) 100 - 199 mg/dL   Triglycerides 148 0 - 149 mg/dL   HDL 35 (L) >39 mg/dL   VLDL Cholesterol Cal 27 5 - 40 mg/dL   LDL Chol Calc (NIH) 158 (H) 0 - 99 mg/dL   Chol/HDL Ratio 6.3 (H) 0.0 - 5.0 ratio  Thyroid Panel With TSH  Result Value Ref Range   TSH 1.550 0.450 - 4.500 uIU/mL   T4, Total 7.9 4.5 - 12.0 ug/dL   T3 Uptake Ratio 33 24 - 39 %   Free Thyroxine Index 2.6 1.2 - 4.9       Pertinent labs & imaging results that were available during my care of the patient were reviewed by me and considered in my medical decision making.  Assessment & Plan:  Marik was seen today for medical management of chronic issues.  Diagnoses and all orders for this visit:  Essential hypertension Well controlled with diet. DASH diet and exercise encouraged. Labs pending.  -     CBC with Differential/Platelet -     CMP14+EGFR -     Lipid panel -      Thyroid Panel With TSH  Rheumatoid arthritis involving multiple sites with positive rheumatoid factor ([facility]) Followed by pain management and doing very well on current regimen.   Gastroesophageal reflux disease without esophagitis No red flags concerning for esophagitis. Preventative measures discussed in detail.  -     CBC with Differential/Platelet  Vitamin D deficiency Labs pending. Will start repletion therapy if warranted. Eat foods rich in Vit D including milk,  orange juice, yogurt with vitamin D added, salmon or mackerel, canned tuna fish, cereals with vitamin D added, and cod liver oil. Get out in the sun but make sure to wear at least SPF 30 sunscreen.  -     CMP14+EGFR -     VITAMIN D 25 Hydroxy (Vit-D Deficiency, Fractures)  Insomnia due to medical condition Doing well on current regimen. Will continue.   Mixed hyperlipidemia Has been diet controlled. Will consider medications if warranted.  -     CMP14+EGFR -     Lipid panel  Nausea Intermittent . Will provide below for symptomatic relief. Report new, worsening, or persistent symptoms.  -     ondansetron (ZOFRAN-ODT) 4 MG disintegrating tablet; Take 1 tablet (4 mg total) by mouth every 8 (eight) hours as needed for nausea or vomiting. -     CBC with Differential/Platelet -     CMP14+EGFR     Continue all other maintenance medications.  Follow up plan: Return in about 6 months (around 09/18/2022), or if symptoms worsen or fail to improve, for CPE with labs.   Continue healthy lifestyle choices, including diet (rich in fruits, vegetables, and lean proteins, and low in salt and simple carbohydrates) and exercise (at least 30 minutes of moderate physical activity daily).  Educational handout given for dyslipidemia  The above assessment and management plan was discussed with the patient. The patient verbalized understanding of and has agreed to the management plan. Patient is aware to call the clinic if they develop any  new symptoms or if symptoms persist or worsen. Patient is aware when to return to the clinic for a follow-up visit. Patient educated on when it is appropriate to go to the emergency department.   Monia Pouch, FNP-C [facility] [phone]

## 2022-03-20 LAB — LIPID PANEL
Chol/HDL Ratio: 5.4 ratio — ABNORMAL HIGH (ref 0.0–5.0)
Cholesterol, Total: 195 mg/dL (ref 100–199)
HDL: 36 mg/dL — ABNORMAL LOW (ref >39)
LDL Chol Calc (NIH): 132 mg/dL — ABNORMAL HIGH (ref 0–99)
Triglycerides: 150 mg/dL — ABNORMAL HIGH (ref 0–149)
VLDL Cholesterol Cal: 27 mg/dL (ref 5–40)

## 2022-03-20 LAB — CBC WITH DIFFERENTIAL/PLATELET
Basophils Absolute: 0.1 10*3/uL (ref 0.0–0.2)
Basos: 1 %
EOS (ABSOLUTE): 0.3 10*3/uL (ref 0.0–0.4)
Eos: 3 %
Hematocrit: 38.9 % (ref 37.5–51.0)
Hemoglobin: 13.3 g/dL (ref 13.0–17.7)
Immature Grans (Abs): 0 10*3/uL (ref 0.0–0.1)
Immature Granulocytes: 0 %
Lymphocytes Absolute: 3.2 10*3/uL — ABNORMAL HIGH (ref 0.7–3.1)
Lymphs: 30 %
MCH: 28.4 pg (ref 26.6–33.0)
MCHC: 34.2 g/dL (ref 31.5–35.7)
MCV: 83 fL (ref 79–97)
Monocytes Absolute: 1.2 10*3/uL — ABNORMAL HIGH (ref 0.1–0.9)
Monocytes: 11 %
Neutrophils Absolute: 6 10*3/uL (ref 1.4–7.0)
Neutrophils: 55 %
Platelets: 468 10*3/uL — ABNORMAL HIGH (ref 150–450)
RBC: 4.68 x10E6/uL (ref 4.14–5.80)
RDW: 14 % (ref 11.6–15.4)
WBC: 10.8 10*3/uL (ref 3.4–10.8)

## 2022-03-20 LAB — CMP14+EGFR
ALT: 13 IU/L (ref 0–44)
AST: 18 IU/L (ref 0–40)
Albumin/Globulin Ratio: 1.3 (ref 1.2–2.2)
Albumin: 3.8 g/dL (ref 3.8–4.9)
Alkaline Phosphatase: 118 IU/L (ref 44–121)
BUN/Creatinine Ratio: 12 (ref 9–20)
BUN: 9 mg/dL (ref 6–24)
Bilirubin Total: 0.2 mg/dL (ref 0.0–1.2)
CO2: 24 mmol/L (ref 20–29)
Calcium: 9.1 mg/dL (ref 8.7–10.2)
Chloride: 101 mmol/L (ref 96–106)
Creatinine, Ser: 0.77 mg/dL (ref 0.76–1.27)
Globulin, Total: 3 g/dL (ref 1.5–4.5)
Glucose: 108 mg/dL — ABNORMAL HIGH (ref 70–99)
Potassium: 4.2 mmol/L (ref 3.5–5.2)
Sodium: 140 mmol/L (ref 134–144)
Total Protein: 6.8 g/dL (ref 6.0–8.5)
eGFR: 105 mL/min/{1.73_m2} (ref >59)

## 2022-03-20 LAB — THYROID PANEL WITH TSH
Free Thyroxine Index: 2.1 (ref 1.2–4.9)
T3 Uptake Ratio: 29 % (ref 24–39)
T4, Total: 7.4 ug/dL (ref 4.5–12.0)
TSH: 1.45 u[IU]/mL (ref 0.450–4.500)

## 2022-03-20 LAB — VITAMIN D 25 HYDROXY (VIT D DEFICIENCY, FRACTURES): Vit D, 25-Hydroxy: 19.6 ng/mL — ABNORMAL LOW (ref 30.0–100.0)

## 2022-03-22 ENCOUNTER — Other Ambulatory Visit (HOSPITAL_COMMUNITY): Payer: Self-pay

## 2022-03-22 ENCOUNTER — Telehealth: Payer: Self-pay

## 2022-03-22 MED ORDER — VITAMIN D (ERGOCALCIFEROL) 1.25 MG (50000 UNIT) PO CAPS: 50000.0000 [IU] | ORAL_CAPSULE | ORAL | 5 refills | Status: DC

## 2022-03-22 NOTE — Telephone Encounter (Signed)
Pharmacy Patient Advocate Encounter   Received notification from [employer] that prior authorization for Ondansetron '4MG'$  dispersible tablets is required/requested.  PA not submitted. Need more information for Covermymeds    Key: [id number]

## 2022-03-22 NOTE — Addendum Note (Signed)
Addended by: Baruch Gouty on: 03/22/2022 11:30 AM   Modules accepted: Orders

## 2022-03-22 NOTE — Telephone Encounter (Signed)
Pharmacy Patient Advocate Encounter   Received notification from [employer] that prior authorization for Ondansetron '4MG'$  dispersible tablets is required/requested.   PA submitted on 03/22/22 to [employer] via CoverMyMeds (fax) [facility]  Status is pending

## 2022-03-23 NOTE — Telephone Encounter (Signed)
Patient aware and verbalizes understanding. 

## 2022-03-23 NOTE — Telephone Encounter (Signed)
Pharmacy Patient Advocate Encounter  Received notification from [employer] that the request for prior authorization for Ondansetron '4mg'$  ODT has been denied due to .

## 2022-04-01 DIAGNOSIS — Z9114 Patient's other noncompliance with medication regimen: Secondary | ICD-10-CM | POA: Diagnosis not present

## 2022-04-01 DIAGNOSIS — M545 Low back pain, unspecified: Secondary | ICD-10-CM | POA: Diagnosis not present

## 2022-04-01 DIAGNOSIS — M62838 Other muscle spasm: Secondary | ICD-10-CM | POA: Diagnosis not present

## 2022-04-01 DIAGNOSIS — G894 Chronic pain syndrome: Secondary | ICD-10-CM | POA: Diagnosis not present

## 2022-04-01 DIAGNOSIS — Z79899 Other long term (current) drug therapy: Secondary | ICD-10-CM | POA: Diagnosis not present

## 2022-04-05 DIAGNOSIS — Z79899 Other long term (current) drug therapy: Secondary | ICD-10-CM | POA: Diagnosis not present

## 2022-07-12 DIAGNOSIS — M545 Low back pain, unspecified: Secondary | ICD-10-CM | POA: Diagnosis not present

## 2022-07-12 DIAGNOSIS — Z91148 Patient's other noncompliance with medication regimen for other reason: Secondary | ICD-10-CM | POA: Diagnosis not present

## 2022-07-12 DIAGNOSIS — M62838 Other muscle spasm: Secondary | ICD-10-CM | POA: Diagnosis not present

## 2022-07-12 DIAGNOSIS — Z79899 Other long term (current) drug therapy: Secondary | ICD-10-CM | POA: Diagnosis not present

## 2022-07-12 DIAGNOSIS — G894 Chronic pain syndrome: Secondary | ICD-10-CM | POA: Diagnosis not present

## 2022-07-14 DIAGNOSIS — Z79899 Other long term (current) drug therapy: Secondary | ICD-10-CM | POA: Diagnosis not present

## 2022-10-06 ENCOUNTER — Other Ambulatory Visit: Payer: Self-pay | Admitting: Family Medicine

## 2022-10-06 DIAGNOSIS — F5101 Primary insomnia: Secondary | ICD-10-CM

## 2022-10-07 ENCOUNTER — Emergency Department (HOSPITAL_BASED_OUTPATIENT_CLINIC_OR_DEPARTMENT_OTHER): Payer: PPO

## 2022-10-07 ENCOUNTER — Encounter (HOSPITAL_BASED_OUTPATIENT_CLINIC_OR_DEPARTMENT_OTHER): Payer: Self-pay

## 2022-10-07 ENCOUNTER — Inpatient Hospital Stay (HOSPITAL_BASED_OUTPATIENT_CLINIC_OR_DEPARTMENT_OTHER)
Admission: EM | Admit: 2022-10-07 | Discharge: 2022-10-09 | DRG: 897 | Disposition: A | Discharge status: Home / Self Care | Payer: PPO | Attending: Internal Medicine | Admitting: Internal Medicine

## 2022-10-07 ENCOUNTER — Other Ambulatory Visit: Payer: Self-pay

## 2022-10-07 DIAGNOSIS — Z981 Arthrodesis status: Secondary | ICD-10-CM | POA: Diagnosis not present

## 2022-10-07 DIAGNOSIS — R6510 Systemic inflammatory response syndrome (SIRS) of non-infectious origin without acute organ dysfunction: Secondary | ICD-10-CM | POA: Diagnosis not present

## 2022-10-07 DIAGNOSIS — E860 Dehydration: Secondary | ICD-10-CM | POA: Diagnosis present

## 2022-10-07 DIAGNOSIS — E872 Acidosis, unspecified: Secondary | ICD-10-CM | POA: Diagnosis not present

## 2022-10-07 DIAGNOSIS — Z825 Family history of asthma and other chronic lower respiratory diseases: Secondary | ICD-10-CM | POA: Diagnosis not present

## 2022-10-07 DIAGNOSIS — G8929 Other chronic pain: Secondary | ICD-10-CM | POA: Diagnosis present

## 2022-10-07 DIAGNOSIS — R918 Other nonspecific abnormal finding of lung field: Secondary | ICD-10-CM | POA: Diagnosis not present

## 2022-10-07 DIAGNOSIS — Z1152 Encounter for screening for COVID-19: Secondary | ICD-10-CM

## 2022-10-07 DIAGNOSIS — E871 Hypo-osmolality and hyponatremia: Secondary | ICD-10-CM | POA: Diagnosis present

## 2022-10-07 DIAGNOSIS — Z811 Family history of alcohol abuse and dependence: Secondary | ICD-10-CM | POA: Diagnosis not present

## 2022-10-07 DIAGNOSIS — F17210 Nicotine dependence, cigarettes, uncomplicated: Secondary | ICD-10-CM | POA: Diagnosis not present

## 2022-10-07 DIAGNOSIS — Z833 Family history of diabetes mellitus: Secondary | ICD-10-CM | POA: Diagnosis not present

## 2022-10-07 DIAGNOSIS — F10229 Alcohol dependence with intoxication, unspecified: Secondary | ICD-10-CM | POA: Diagnosis present

## 2022-10-07 DIAGNOSIS — G4700 Insomnia, unspecified: Secondary | ICD-10-CM | POA: Diagnosis not present

## 2022-10-07 DIAGNOSIS — F1123 Opioid dependence with withdrawal: Secondary | ICD-10-CM | POA: Diagnosis not present

## 2022-10-07 DIAGNOSIS — Y901 Blood alcohol level of 20-39 mg/100 ml: Secondary | ICD-10-CM | POA: Diagnosis present

## 2022-10-07 DIAGNOSIS — R911 Solitary pulmonary nodule: Secondary | ICD-10-CM

## 2022-10-07 DIAGNOSIS — Z129 Encounter for screening for malignant neoplasm, site unspecified: Secondary | ICD-10-CM

## 2022-10-07 DIAGNOSIS — R000 Tachycardia, unspecified: Secondary | ICD-10-CM | POA: Diagnosis not present

## 2022-10-07 DIAGNOSIS — D72829 Elevated white blood cell count, unspecified: Secondary | ICD-10-CM | POA: Diagnosis not present

## 2022-10-07 DIAGNOSIS — F32A Depression, unspecified: Secondary | ICD-10-CM | POA: Diagnosis not present

## 2022-10-07 DIAGNOSIS — M069 Rheumatoid arthritis, unspecified: Secondary | ICD-10-CM | POA: Diagnosis not present

## 2022-10-07 DIAGNOSIS — F1193 Opioid use, unspecified with withdrawal: Secondary | ICD-10-CM | POA: Diagnosis not present

## 2022-10-07 DIAGNOSIS — A419 Sepsis, unspecified organism: Secondary | ICD-10-CM | POA: Diagnosis not present

## 2022-10-07 DIAGNOSIS — F10931 Alcohol use, unspecified with withdrawal delirium: Secondary | ICD-10-CM

## 2022-10-07 DIAGNOSIS — Z79899 Other long term (current) drug therapy: Secondary | ICD-10-CM

## 2022-10-07 DIAGNOSIS — Z0389 Encounter for observation for other suspected diseases and conditions ruled out: Secondary | ICD-10-CM | POA: Diagnosis not present

## 2022-10-07 DIAGNOSIS — E876 Hypokalemia: Secondary | ICD-10-CM | POA: Diagnosis not present

## 2022-10-07 DIAGNOSIS — F10239 Alcohol dependence with withdrawal, unspecified: Secondary | ICD-10-CM | POA: Diagnosis not present

## 2022-10-07 DIAGNOSIS — R569 Unspecified convulsions: Secondary | ICD-10-CM | POA: Diagnosis not present

## 2022-10-07 DIAGNOSIS — F10939 Alcohol use, unspecified with withdrawal, unspecified: Secondary | ICD-10-CM | POA: Diagnosis present

## 2022-10-07 DIAGNOSIS — R4182 Altered mental status, unspecified: Secondary | ICD-10-CM | POA: Diagnosis not present

## 2022-10-07 LAB — CBC WITH DIFFERENTIAL/PLATELET
Abs Immature Granulocytes: 0.08 10*3/uL — ABNORMAL HIGH (ref 0.00–0.07)
Basophils Absolute: 0.1 10*3/uL (ref 0.0–0.1)
Basophils Relative: 0 %
Eosinophils Absolute: 0 10*3/uL (ref 0.0–0.5)
Eosinophils Relative: 0 %
HCT: 45.9 % (ref 39.0–52.0)
Hemoglobin: 15.8 g/dL (ref 13.0–17.0)
Immature Granulocytes: 0 %
Lymphocytes Relative: 23 %
Lymphs Abs: 4.8 10*3/uL — ABNORMAL HIGH (ref 0.7–4.0)
MCH: 29 pg (ref 26.0–34.0)
MCHC: 34.4 g/dL (ref 30.0–36.0)
MCV: 84.4 fL (ref 80.0–100.0)
Monocytes Absolute: 2.6 10*3/uL — ABNORMAL HIGH (ref 0.1–1.0)
Monocytes Relative: 13 %
Neutro Abs: 13.2 10*3/uL — ABNORMAL HIGH (ref 1.7–7.7)
Neutrophils Relative %: 64 %
Platelets: 523 10*3/uL — ABNORMAL HIGH (ref 150–400)
RBC: 5.44 MIL/uL (ref 4.22–5.81)
RDW: 14.3 % (ref 11.5–15.5)
WBC: 20.8 10*3/uL — ABNORMAL HIGH (ref 4.0–10.5)
nRBC: 0 % (ref 0.0–0.2)

## 2022-10-07 LAB — LACTIC ACID, PLASMA
Lactic Acid, Venous: 7 mmol/L (ref 0.5–1.9)
Lactic Acid, Venous: 3.8 mmol/L (ref 0.5–1.9)
Lactic Acid, Venous: 1.7 mmol/L (ref 0.5–1.9)
Lactic Acid, Venous: 2.1 mmol/L (ref 0.5–1.9)

## 2022-10-07 LAB — APTT: aPTT: 27 seconds (ref 24–36)

## 2022-10-07 LAB — URINALYSIS, W/ REFLEX TO CULTURE (INFECTION SUSPECTED)
Bacteria, UA: NONE SEEN
Bilirubin Urine: NEGATIVE
Glucose, UA: NEGATIVE mg/dL
Hgb urine dipstick: NEGATIVE
Ketones, ur: NEGATIVE mg/dL
Leukocytes,Ua: NEGATIVE
Nitrite: NEGATIVE
Protein, ur: NEGATIVE mg/dL
Specific Gravity, Urine: 1.027 (ref 1.005–1.030)
pH: 6.5 (ref 5.0–8.0)

## 2022-10-07 LAB — RAPID URINE DRUG SCREEN, HOSP PERFORMED
Amphetamines: 0 — AB
Barbiturates: 0 — AB
Benzodiazepines: 0 — AB
Cocaine: 0 — AB
Opiates: 0 — AB
Tetrahydrocannabinol: 0 — AB

## 2022-10-07 LAB — ETHANOL: Alcohol, Ethyl (B): 33 mg/dL — ABNORMAL HIGH (ref <10)

## 2022-10-07 LAB — COMPREHENSIVE METABOLIC PANEL
ALT: 21 U/L (ref 0–44)
AST: 26 U/L (ref 15–41)
Albumin: 4.5 g/dL (ref 3.5–5.0)
Alkaline Phosphatase: 97 U/L (ref 38–126)
Anion gap: 19 — ABNORMAL HIGH (ref 5–15)
BUN: 14 mg/dL (ref 6–20)
CO2: 19 mmol/L — ABNORMAL LOW (ref 22–32)
Calcium: 9.2 mg/dL (ref 8.9–10.3)
Chloride: 96 mmol/L — ABNORMAL LOW (ref 98–111)
Creatinine, Ser: 0.9 mg/dL (ref 0.61–1.24)
GFR, Estimated: >60 mL/min (ref >60)
Glucose, Bld: 189 mg/dL — ABNORMAL HIGH (ref 70–99)
Potassium: 3.1 mmol/L — ABNORMAL LOW (ref 3.5–5.1)
Sodium: 134 mmol/L — ABNORMAL LOW (ref 135–145)
Total Bilirubin: 0.5 mg/dL (ref 0.3–1.2)
Total Protein: 8.1 g/dL (ref 6.5–8.1)

## 2022-10-07 LAB — RESP PANEL BY RT-PCR (RSV, FLU A&B, COVID)  RVPGX2
Influenza A by PCR: NEGATIVE
Influenza B by PCR: NEGATIVE
Resp Syncytial Virus by PCR: NEGATIVE
SARS Coronavirus 2 by RT PCR: NEGATIVE

## 2022-10-07 LAB — HIV ANTIBODY (ROUTINE TESTING W REFLEX): HIV Screen 4th Generation wRfx: NONREACTIVE

## 2022-10-07 LAB — PROTIME-INR
INR: 1.1 (ref 0.8–1.2)
Prothrombin Time: 14.7 seconds (ref 11.4–15.2)

## 2022-10-07 LAB — MRSA NEXT GEN BY PCR, NASAL: MRSA by PCR Next Gen: NOT DETECTED

## 2022-10-07 LAB — ACETAMINOPHEN LEVEL: Acetaminophen (Tylenol), Serum: <10 ug/mL — ABNORMAL LOW (ref 10–30)

## 2022-10-07 LAB — CK: Total CK: 109 U/L (ref 49–397)

## 2022-10-07 LAB — SALICYLATE LEVEL: Salicylate Lvl: <7 mg/dL — ABNORMAL LOW (ref 7.0–30.0)

## 2022-10-07 LAB — LIPASE, BLOOD: Lipase: 16 U/L (ref 11–51)

## 2022-10-07 MED ORDER — SODIUM CHLORIDE 0.9 % IV SOLN
2.0000 g | Freq: Three times a day (TID) | INTRAVENOUS | Status: DC
  Administered 2022-10-07 – 2022-10-08 (×3): 2 g via INTRAVENOUS
  Filled 2022-10-07 (×3): qty 12.5

## 2022-10-07 MED ORDER — ONDANSETRON HCL 4 MG/2ML IJ SOLN: 4.0000 mg | Freq: Four times a day (QID) | INTRAMUSCULAR | Status: DC | PRN

## 2022-10-07 MED ORDER — LORAZEPAM 2 MG/ML IJ SOLN
0.0000 mg | Freq: Four times a day (QID) | INTRAMUSCULAR | Status: DC
  Filled 2022-10-07: qty 2

## 2022-10-07 MED ORDER — METRONIDAZOLE 500 MG/100ML IV SOLN
500.0000 mg | Freq: Two times a day (BID) | INTRAVENOUS | Status: DC
  Administered 2022-10-07 – 2022-10-08 (×2): 500 mg via INTRAVENOUS
  Filled 2022-10-07 (×2): qty 100

## 2022-10-07 MED ORDER — LORAZEPAM 1 MG PO TABS
1.0000 mg | ORAL_TABLET | ORAL | Status: DC | PRN
  Administered 2022-10-07: 1 mg via ORAL
  Administered 2022-10-07: 4 mg via ORAL
  Administered 2022-10-09: 1 mg via ORAL
  Filled 2022-10-07: qty 1
  Filled 2022-10-07: qty 4
  Filled 2022-10-07: qty 1

## 2022-10-07 MED ORDER — LORAZEPAM 1 MG PO TABS: 0.0000 mg | ORAL_TABLET | Freq: Four times a day (QID) | ORAL | Status: DC

## 2022-10-07 MED ORDER — VANCOMYCIN HCL IN DEXTROSE 1-5 GM/200ML-% IV SOLN: 1000.0000 mg | Freq: Once | INTRAVENOUS | Status: DC

## 2022-10-07 MED ORDER — ALBUTEROL SULFATE (2.5 MG/3ML) 0.083% IN NEBU: 2.5000 mg | INHALATION_SOLUTION | RESPIRATORY_TRACT | Status: DC | PRN

## 2022-10-07 MED ORDER — IOHEXOL 300 MG/ML  SOLN
100.0000 mL | Freq: Once | INTRAMUSCULAR | Status: AC | PRN
  Administered 2022-10-07: 100 mL via INTRAVENOUS

## 2022-10-07 MED ORDER — CHLORDIAZEPOXIDE HCL 25 MG PO CAPS
25.0000 mg | ORAL_CAPSULE | Freq: Four times a day (QID) | ORAL | Status: AC
  Administered 2022-10-07 (×4): 25 mg via ORAL
  Filled 2022-10-07 (×4): qty 1

## 2022-10-07 MED ORDER — THIAMINE HCL 100 MG/ML IJ SOLN: 100.0000 mg | Freq: Every day | INTRAMUSCULAR | Status: DC

## 2022-10-07 MED ORDER — SODIUM CHLORIDE 0.9 % IV SOLN
2.0000 g | Freq: Once | INTRAVENOUS | Status: AC
  Administered 2022-10-07: 2 g via INTRAVENOUS
  Filled 2022-10-07: qty 12.5

## 2022-10-07 MED ORDER — LACTATED RINGERS IV BOLUS (SEPSIS)
1000.0000 mL | Freq: Once | INTRAVENOUS | Status: AC
  Administered 2022-10-07: 1000 mL via INTRAVENOUS

## 2022-10-07 MED ORDER — PHENOBARBITAL SODIUM 65 MG/ML IJ SOLN
130.0000 mg | Freq: Once | INTRAMUSCULAR | Status: AC
  Administered 2022-10-07: 130 mg via INTRAVENOUS
  Filled 2022-10-07: qty 2

## 2022-10-07 MED ORDER — LACTATED RINGERS IV BOLUS
1500.0000 mL | Freq: Once | INTRAVENOUS | Status: AC
  Administered 2022-10-07: 1500 mL via INTRAVENOUS

## 2022-10-07 MED ORDER — CHLORDIAZEPOXIDE HCL 5 MG PO CAPS
25.0000 mg | ORAL_CAPSULE | Freq: Three times a day (TID) | ORAL | Status: AC
  Administered 2022-10-08 (×3): 25 mg via ORAL
  Filled 2022-10-07: qty 5
  Filled 2022-10-07: qty 1
  Filled 2022-10-07: qty 5

## 2022-10-07 MED ORDER — LORAZEPAM 2 MG/ML IJ SOLN
0.0000 mg | Freq: Two times a day (BID) | INTRAMUSCULAR | Status: DC
  Administered 2022-10-07: 3 mg via INTRAVENOUS

## 2022-10-07 MED ORDER — VANCOMYCIN HCL 1250 MG/250ML IV SOLN
1250.0000 mg | Freq: Two times a day (BID) | INTRAVENOUS | Status: DC
  Administered 2022-10-07 – 2022-10-08 (×2): 1250 mg via INTRAVENOUS
  Filled 2022-10-07 (×2): qty 250

## 2022-10-07 MED ORDER — HYDROXYZINE HCL 25 MG PO TABS: 25.0000 mg | ORAL_TABLET | Freq: Four times a day (QID) | ORAL | Status: DC | PRN

## 2022-10-07 MED ORDER — IBUPROFEN 400 MG PO TABS
600.0000 mg | ORAL_TABLET | Freq: Once | ORAL | Status: AC
  Administered 2022-10-07: 600 mg via ORAL
  Filled 2022-10-07: qty 1

## 2022-10-07 MED ORDER — SENNOSIDES-DOCUSATE SODIUM 8.6-50 MG PO TABS: 1.0000 | ORAL_TABLET | Freq: Every evening | ORAL | Status: DC | PRN

## 2022-10-07 MED ORDER — ACETAMINOPHEN 325 MG PO TABS: 650.0000 mg | ORAL_TABLET | Freq: Four times a day (QID) | ORAL | Status: DC | PRN

## 2022-10-07 MED ORDER — POTASSIUM CHLORIDE CRYS ER 20 MEQ PO TBCR
40.0000 meq | EXTENDED_RELEASE_TABLET | Freq: Once | ORAL | Status: AC
  Administered 2022-10-07: 40 meq via ORAL
  Filled 2022-10-07: qty 2

## 2022-10-07 MED ORDER — CHLORDIAZEPOXIDE HCL 25 MG PO CAPS: 25.0000 mg | ORAL_CAPSULE | Freq: Four times a day (QID) | ORAL | Status: DC | PRN

## 2022-10-07 MED ORDER — CHLORDIAZEPOXIDE HCL 5 MG PO CAPS: 25.0000 mg | ORAL_CAPSULE | ORAL | Status: DC

## 2022-10-07 MED ORDER — NICOTINE 14 MG/24HR TD PT24
14.0000 mg | MEDICATED_PATCH | Freq: Every day | TRANSDERMAL | Status: DC
  Administered 2022-10-07 – 2022-10-08 (×2): 14 mg via TRANSDERMAL
  Filled 2022-10-07 (×2): qty 1

## 2022-10-07 MED ORDER — CHLORDIAZEPOXIDE HCL 5 MG PO CAPS: 25.0000 mg | ORAL_CAPSULE | Freq: Every day | ORAL | Status: DC

## 2022-10-07 MED ORDER — LOPERAMIDE HCL 2 MG PO CAPS: 2.0000 mg | ORAL_CAPSULE | ORAL | Status: DC | PRN

## 2022-10-07 MED ORDER — ACETAMINOPHEN 650 MG RE SUPP: 650.0000 mg | Freq: Four times a day (QID) | RECTAL | Status: DC | PRN

## 2022-10-07 MED ORDER — ONDANSETRON HCL 4 MG PO TABS: 4.0000 mg | ORAL_TABLET | Freq: Four times a day (QID) | ORAL | Status: DC | PRN

## 2022-10-07 MED ORDER — LORAZEPAM 2 MG/ML IJ SOLN
1.0000 mg | Freq: Once | INTRAMUSCULAR | Status: AC
  Administered 2022-10-07: 1 mg via INTRAVENOUS
  Filled 2022-10-07: qty 1

## 2022-10-07 MED ORDER — TRAMADOL HCL 50 MG PO TABS
50.0000 mg | ORAL_TABLET | Freq: Four times a day (QID) | ORAL | Status: DC | PRN
  Administered 2022-10-07 – 2022-10-08 (×2): 50 mg via ORAL
  Filled 2022-10-07 (×2): qty 1

## 2022-10-07 MED ORDER — METRONIDAZOLE 500 MG/100ML IV SOLN
500.0000 mg | Freq: Once | INTRAVENOUS | Status: AC
  Administered 2022-10-07: 500 mg via INTRAVENOUS
  Filled 2022-10-07: qty 100

## 2022-10-07 MED ORDER — ADULT MULTIVITAMIN W/MINERALS CH
1.0000 | ORAL_TABLET | Freq: Every day | ORAL | Status: DC
  Administered 2022-10-07 – 2022-10-08 (×2): 1 via ORAL
  Filled 2022-10-07 (×2): qty 1

## 2022-10-07 MED ORDER — THIAMINE HCL 100 MG/ML IJ SOLN: 100.0000 mg | Freq: Once | INTRAMUSCULAR | Status: DC

## 2022-10-07 MED ORDER — TRAZODONE HCL 100 MG PO TABS
100.0000 mg | ORAL_TABLET | Freq: Every evening | ORAL | Status: DC | PRN
  Administered 2022-10-07 – 2022-10-08 (×2): 100 mg via ORAL
  Filled 2022-10-07: qty 2
  Filled 2022-10-07: qty 1

## 2022-10-07 MED ORDER — THIAMINE MONONITRATE 100 MG PO TABS
100.0000 mg | ORAL_TABLET | Freq: Every day | ORAL | Status: DC
  Administered 2022-10-07 – 2022-10-08 (×2): 100 mg via ORAL
  Filled 2022-10-07 (×2): qty 1

## 2022-10-07 MED ORDER — ONDANSETRON 4 MG PO TBDP
4.0000 mg | ORAL_TABLET | Freq: Four times a day (QID) | ORAL | Status: DC | PRN
Start: 2022-10-07 — End: 2022-10-07

## 2022-10-07 MED ORDER — CHLORHEXIDINE GLUCONATE CLOTH 2 % EX PADS
6.0000 | MEDICATED_PAD | Freq: Every day | CUTANEOUS | Status: DC
  Administered 2022-10-07: 6 via TOPICAL

## 2022-10-07 MED ORDER — LACTATED RINGERS IV SOLN: INTRAVENOUS | Status: DC

## 2022-10-07 MED ORDER — VANCOMYCIN HCL IN DEXTROSE 1-5 GM/200ML-% IV SOLN
1000.0000 mg | INTRAVENOUS | Status: AC
  Administered 2022-10-07: 1000 mg via INTRAVENOUS
  Filled 2022-10-07: qty 200

## 2022-10-07 MED ORDER — LORAZEPAM 1 MG PO TABS: 0.0000 mg | ORAL_TABLET | Freq: Two times a day (BID) | ORAL | Status: DC

## 2022-10-07 MED ORDER — ENOXAPARIN SODIUM 40 MG/0.4ML IJ SOSY
40.0000 mg | PREFILLED_SYRINGE | INTRAMUSCULAR | Status: DC
  Administered 2022-10-07 – 2022-10-08 (×2): 40 mg via SUBCUTANEOUS
  Filled 2022-10-07 (×2): qty 0.4

## 2022-10-07 NOTE — ED Provider Notes (Signed)
[place] EMERGENCY DEPARTMENT AT [facility] ([place])  Provider Note  CSN: [id number] Arrival date & time: 10/07/22 0405  History Chief Complaint  Patient presents with   Fever   Altered Mental Status    Erik Shaffer is a 57 y.o. male brought to the ED by his niece who helps supplement the history. He has a history of chronic pain/RA on Tramadol but ran out a few days ago. In order to manage the withdrawal symptoms he began drinking EtOH heavily, including a bottle of bourbon every day for the last 3 days, last was Wednesday afternoon (6/19) about 12 hours prior to arrival. Before that he had not been drinking in at least 6 months. Denies every day EtOH use. No other illicit drugs. He states this evening he has been confused, feeling hot and having tremors. Niece states he felt warm at home. He denies any recent illness, no cough, congestion, CP, SOB. One episode of vomiting yesterday he attributed to EtOH. No diarrhea or abdominal pain. No dysuria or hematuria. No rashes or tick bites. He denies headache.    Home Medications Prior to Admission medications   Medication Sig Start Date End Date Taking? Authorizing Provider  Crisaborole (EUCRISA) 2 % OINT Apply 1 application topically daily. 06/04/21   Sonny Masters, FNP  naproxen (NAPROSYN) 500 MG tablet TAKE 1 TABLET BY MOUTH TWICE A DAY 01/05/22   Sonny Masters, FNP  ondansetron (ZOFRAN-ODT) 4 MG disintegrating tablet Take 1 tablet (4 mg total) by mouth every 8 (eight) hours as needed for nausea or vomiting. 03/19/22   Sonny Masters, FNP  tiZANidine (ZANAFLEX) 2 MG tablet Take 1 tablet (2 mg total) by mouth 2 (two) times daily. 02/27/21   Sonny Masters, FNP  traMADol (ULTRAM) 50 MG tablet Take by mouth. 02/09/21   [provider]  traZODone (DESYREL) 100 MG tablet Take 2 tablets (200 mg total) by mouth at bedtime as needed for sleep. 01/01/22   Sonny Masters, FNP  triamcinolone cream (KENALOG) 0.1 % Apply 1 application.  topically 2 (two) times daily. 09/08/21   Sonny Masters, FNP  Vitamin D, Ergocalciferol, (DRISDOL) 1.25 MG (50000 UNIT) CAPS capsule Take 1 capsule (50,000 Units total) by mouth every 7 (seven) days. 03/22/22   Sonny Masters, FNP     Allergies    Tomato and Butrans [buprenorphine]   Review of Systems   Review of Systems Please see HPI for pertinent positives and negatives  Physical Exam BP 124/67   Pulse 98   Temp 98.9 F (37.2 C)   Resp (!) 21   Ht 6' (1.829 m)   Wt 81.6 kg   SpO2 98%   BMI 24.41 kg/m   Physical Exam Vitals and nursing note reviewed.  Constitutional:      Appearance: Normal appearance.  HENT:     Head: Normocephalic and atraumatic.     Nose: Nose normal.     Mouth/Throat:     Mouth: Mucous membranes are moist.  Eyes:     Extraocular Movements: Extraocular movements intact.     Conjunctiva/sclera: Conjunctivae normal.  Cardiovascular:     Rate and Rhythm: Tachycardia present.  Pulmonary:     Effort: Pulmonary effort is normal.     Breath sounds: Normal breath sounds. No wheezing.  Abdominal:     General: Abdomen is flat.     Palpations: Abdomen is soft.     Tenderness: There is no abdominal tenderness.  Musculoskeletal:  General: No swelling. Normal range of motion.     Cervical back: Normal range of motion and neck supple. No rigidity.  Skin:    General: Skin is warm and dry.  Neurological:     General: No focal deficit present.     Mental Status: He is alert and oriented to person, place, and time.     Cranial Nerves: No cranial nerve deficit.     Sensory: No sensory deficit.     Motor: No weakness.     Comments: Patient awake and alert, tremulous and restless in bed.   Psychiatric:     Comments: restless     ED Results / Procedures / Treatments   EKG EKG Interpretation  Date/Time:  Thursday October 07 2022 04:23:12 EDT Ventricular Rate:  121 PR Interval:  135 QRS Duration: 91 QT Interval:  299 QTC Calculation: 425 R  Axis:   -55 Text Interpretation: Sinus tachycardia LAD, consider left anterior fascicular block Abnormal R-wave progression, early transition Borderline T wave abnormalities Since last tracing Rate faster Confirmed by Susy Frizzle [phone]) on 10/07/2022 4:24:18 AM  Procedures .Critical Care  Performed by: Pollyann Savoy, MD Authorized by: Pollyann Savoy, MD   Critical care provider statement:    Critical care time (minutes):  80   Critical care time was exclusive of:  Separately billable procedures and treating other patients   Critical care was necessary to treat or prevent imminent or life-threatening deterioration of the following conditions:  Sepsis and CNS failure or compromise   Critical care was time spent personally by me on the following activities:  Development of treatment plan with patient or surrogate, discussions with consultants, evaluation of patient's response to treatment, examination of patient, ordering and review of laboratory studies, ordering and review of radiographic studies, ordering and performing treatments and interventions, pulse oximetry, re-evaluation of patient's condition and review of old charts   Medications Ordered in the ED Medications  lactated ringers infusion ( Intravenous New Bag/Given 10/07/22 0607)  vancomycin (VANCOCIN) IVPB 1000 mg/200 mL premix (1,000 mg Intravenous New Bag/Given 10/07/22 0611)  LORazepam (ATIVAN) injection 0-4 mg (has no administration in time range)    Or  LORazepam (ATIVAN) tablet 0-4 mg (has no administration in time range)  LORazepam (ATIVAN) injection 0-4 mg (3 mg Intravenous Given 10/07/22 0558)    Or  LORazepam (ATIVAN) tablet 0-4 mg ( Oral See Alternative 10/07/22 0558)  thiamine (VITAMIN B1) tablet 100 mg (has no administration in time range)    Or  thiamine (VITAMIN B1) injection 100 mg (has no administration in time range)  PHENObarbital (LUMINAL) injection 130 mg (has no administration in time range)   lactated ringers bolus 1,000 mL (0 mLs Intravenous Stopped 10/07/22 0614)  LORazepam (ATIVAN) injection 1 mg (1 mg Intravenous Given 10/07/22 0447)  ibuprofen (ADVIL) tablet 600 mg (600 mg Oral Given 10/07/22 0452)  ceFEPIme (MAXIPIME) 2 g in sodium chloride 0.9 % 100 mL IVPB (0 g Intravenous Stopped 10/07/22 0614)  metroNIDAZOLE (FLAGYL) IVPB 500 mg (0 mg Intravenous Stopped 10/07/22 0614)  lactated ringers bolus 1,500 mL (1,500 mLs Intravenous New Bag/Given 10/07/22 0538)  iohexol (OMNIPAQUE) 300 MG/ML solution 100 mL (100 mLs Intravenous Contrast Given 10/07/22 0644)    Initial Impression and Plan  Patient here with borderline fever unknown temp at home. Also noted to be tremulous and tachycardic. Some mild mental status changes but overall alert and able to converse appropriately. Consider EtOH withdrawal presuming he is not being fully truthful  about his EtOH use pattern, would not expect dependency after only three days of drinking. Could be some degree of opiate withdrawal as well. Will give a dose of Ativan for restlessness/tremor. Check labs for source of fever. He does not have any meningismus to suggest meningitis/encephalitis. No definite infectious source by history, sepsis considered less likely but will continue to monitor closely. If bacterial source is identified, will begin Abx.    ED Course   Clinical Course as of 10/07/22 0727  Thu Oct 07, 2022  0502 CBC with marked leukocytosis. Will begin broad spectrum Abx for sepsis of unknown etiology.  [CS]  0503 I personally viewed the images from radiology studies and agree with radiologist interpretation: CXR is clear.  [CS]  0521 Coags are normal.  [CS]  0528 EtOH is still mildly elevated, this is about 12 hours after last reported use.  [CS]  0528 CMP with mild hyponatremia/hypokalemia. Increased anion gap likely due to alcoholic ketosis and/or lactic acidosis.  [CS]  0529 CK is normal. Lactic acid is markedly elevated. Will increase LR  bolus to full 30mg /kg.  [CS]  [phone] Patient seems less tremulous after Ativan.  [CS]  0540 Covid/Flu/RSV swab is neg.  [CS]  0551 CIWA protocol ordered.  [CS]  0559 Patient now reports he was having some RLQ abdominal pain earlier tonight. Denied tenderness during my initial exam and again on re-exam but then states it actually is hurting him when I push. Will send for CT CAP to eval occult source of sepsis. Patient and family at bedside aware he will need admission.  [CS]  9604 HR continues to improve with IVF and additional Ativan.  [CS]  0702 Care of the patient signed out pending completion of workup and anticipated admission for sepsis and likely EtOH withdrawal.  [CS]    Clinical Course User Index [CS] Pollyann Savoy, MD     MDM Rules/Calculators/A&P Medical Decision Making Problems Addressed: Alcohol withdrawal syndrome with complication [facility]): acute illness or injury that poses a threat to life or bodily functions SIRS (systemic inflammatory response syndrome) (HCC): acute illness or injury that poses a threat to life or bodily functions  Amount and/or Complexity of Data Reviewed Labs: ordered. Decision-making details documented in ED Course. Radiology: ordered and independent interpretation performed. Decision-making details documented in ED Course. ECG/medicine tests: ordered and independent interpretation performed. Decision-making details documented in ED Course.  Risk OTC drugs. Prescription drug management. Parenteral controlled substances.     Final Clinical Impression(s) / ED Diagnoses Final diagnoses:  Alcohol withdrawal syndrome with complication (HCC)  SIRS (systemic inflammatory response syndrome) (HCC)    Rx / DC Orders ED Discharge Orders     None        Pollyann Savoy, MD 10/07/22 [phone]

## 2022-10-07 NOTE — H&P (Signed)
History and Physical  Erik Shaffer:[id number] DOB: [birth year] DOA: 10/07/2022  PCP: Sonny Masters, FNP   Chief Complaint: Fever, shakiness  HPI: Erik Shaffer is a 57 y.o. male with medical history significant for rheumatoid arthritis not on Biologics, on tramadol via pain management, being admitted to the hospital with alcohol withdrawal and suspected narcotic withdrawal.  History is provided by the patient as well as his sister who is at the bedside this morning.  They tell me that he has a long history of difficult to control rheumatoid arthritis, he has been on multiple DMARDs in the past and has not been able to tolerate due to severe side effects.  Currently, the only treatment he is on for his rheumatoid arthritis is tramadol through the pain clinic.  His sister tells me that he does have a prior history of narcotic addiction, but has been clean for several years.  He also does not drink on a regular basis.  She does say that from time to time, when he runs out of the very limited supply of tramadol he is prescribed, he does not have a choice other than to medicate with alcohol.  3 days prior to presenting to the ER, he ran out of tramadol was in severe pain, drink a bottle of bourbon each of the last 3 days.  Last drink of alcohol was late on the evening of 6/18.  Patient has felt very warm to touch, and he was quite confused and agitated, which is why his sister brought him to the emergency department.  ED Course: On arrival to the emergency department, he was noted to be febrile, tachycardic, somewhat agitated.  He was noted to be asking repetitive questions, etc.  Lab work as noted below demonstrates severe leukocytosis, lactic acidosis.  Patient was given IV Ativan, potassium replacement, IV fluids.  Due to fever and concern for possible sepsis, he had extensive workup which not reveal any source of infection, however he was empirically given IV vancomycin, cefepime and Flagyl.  He was  noted to be much more calm and less agitated after this.  Hospitalist was contacted for continued treatment.  Currently: Patient is resting comfortably on the stepdown unit, he is pleasant and cooperative, sister is at the bedside.  He says that he has his usual joint pain from his arthritis, but denies any cough, shortness of breath, nausea, or any pain that is not typical for him.  Review of Systems: Please see HPI for pertinent positives and negatives. A complete 10 system review of systems are otherwise negative.  Past Medical History:  Diagnosis Date   Arthritis    Depression    Ulcer    Past Surgical History:  Procedure Laterality Date   HEMILAMINOTOMY LUMBAR SPINE  06/24/2010   MICRODISCECTOMY LUMBAR     L5-S1   SPINE SURGERY  02/2011   Dr. Marikay Alar     Social History:  reports that he has been smoking cigarettes. He has a 30.00 pack-year smoking history. He has never used smokeless tobacco. He reports that he does not drink alcohol and does not use drugs.   Allergies  Allergen Reactions   Tomato    Butrans [Buprenorphine]     fatigue    Family History  Problem Relation Age of Onset   Alcohol abuse Mother    Cancer Father    Arthritis Other    COPD Other        Lung cancer   Hyperlipidemia Other  Diabetes Other    Stomach cancer Neg Hx    Rectal cancer Neg Hx    Esophageal cancer Neg Hx    Colon cancer Neg Hx      Prior to Admission medications   Medication Sig Start Date End Date Taking? Authorizing Provider  Crisaborole (EUCRISA) 2 % OINT Apply 1 application topically daily. 06/04/21   Sonny Masters, FNP  naproxen (NAPROSYN) 500 MG tablet TAKE 1 TABLET BY MOUTH TWICE A DAY 01/05/22   Sonny Masters, FNP  ondansetron (ZOFRAN-ODT) 4 MG disintegrating tablet Take 1 tablet (4 mg total) by mouth every 8 (eight) hours as needed for nausea or vomiting. 03/19/22   Sonny Masters, FNP  tiZANidine (ZANAFLEX) 2 MG tablet Take 1 tablet (2 mg total) by mouth 2 (two)  times daily. 02/27/21   Sonny Masters, FNP  traMADol (ULTRAM) 50 MG tablet Take by mouth. 02/09/21   [provider]  traZODone (DESYREL) 100 MG tablet Take 2 tablets (200 mg total) by mouth at bedtime as needed for sleep. 01/01/22   Sonny Masters, FNP  triamcinolone cream (KENALOG) 0.1 % Apply 1 application. topically 2 (two) times daily. 09/08/21   Sonny Masters, FNP  Vitamin D, Ergocalciferol, (DRISDOL) 1.25 MG (50000 UNIT) CAPS capsule Take 1 capsule (50,000 Units total) by mouth every 7 (seven) days. 03/22/22   Sonny Masters, FNP    Physical Exam: BP 124/67   Pulse 98   Temp 98.7 F (37.1 C) (Oral)   Resp (!) 21   Ht 6' (1.829 m)   Wt 81.6 kg   SpO2 98%   BMI 24.41 kg/m   General:  Alert, oriented, calm, in no acute distress, sister at the bedside Eyes: EOMI, clear conjuctivae, white sclerea Neck: supple, no masses, trachea mildline  Cardiovascular: RRR, no murmurs or rubs, no peripheral edema  Respiratory: clear to auscultation bilaterally, no wheezes, no crackles  Abdomen: soft, nontender, nondistended, normal bowel tones heard  Skin: dry, no rashes  Musculoskeletal: no joint effusions, normal range of motion, chronic appearing rheumatoid arthritis changes, no inflamed or tender joints, no joint effusions identified Psychiatric: appropriate affect, normal speech  Neurologic: extraocular muscles intact, clear speech, moving all extremities with intact sensorium          Labs on Admission:  Basic Metabolic Panel: Recent Labs  Lab 10/07/22 0439  NA 134*  K 3.1*  CL 96*  CO2 19*  GLUCOSE 189*  BUN 14  CREATININE 0.90  CALCIUM 9.2   Liver Function Tests: Recent Labs  Lab 10/07/22 0439  AST 26  ALT 21  ALKPHOS 97  BILITOT 0.5  PROT 8.1  ALBUMIN 4.5   Recent Labs  Lab 10/07/22 0439  LIPASE 16   No results for input(s): "AMMONIA" in the last 168 hours. CBC: Recent Labs  Lab 10/07/22 0439  WBC 20.8*  NEUTROABS 13.2*  HGB 15.8  HCT 45.9   MCV 84.4  PLT 523*   Cardiac Enzymes: Recent Labs  Lab 10/07/22 0439  CKTOTAL 109    BNP (last 3 results) No results for input(s): "BNP" in the last 8760 hours.  ProBNP (last 3 results) No results for input(s): "PROBNP" in the last 8760 hours.  CBG: No results for input(s): "GLUCAP" in the last 168 hours.  Radiological Exams on Admission: CT CHEST ABDOMEN PELVIS W CONTRAST  Result Date: 10/07/2022 CLINICAL DATA:  Sepsis. Fever and tachycardia with right lower quadrant pain. Alcohol withdrawal. Evaluate for appendicitis. EXAM:  CT CHEST, ABDOMEN, AND PELVIS WITH CONTRAST TECHNIQUE: Multidetector CT imaging of the chest, abdomen and pelvis was performed following the standard protocol during bolus administration of intravenous contrast. RADIATION DOSE REDUCTION: This exam was performed according to the departmental dose-optimization program which includes automated exposure control, adjustment of the mA and/or kV according to patient size and/or use of iterative reconstruction technique. CONTRAST:  OMNIPAQUE IOHEXOL 300 MG/ML  SOLN COMPARISON:  None Available. FINDINGS: CT CHEST FINDINGS Cardiovascular: No significant vascular findings. Normal heart size. No pericardial effusion. Mediastinum/Nodes: Negative for mass or adenopathy Lungs/Pleura: There is no edema, consolidation, effusion, or pneumothorax. 4 mm adjacent pulmonary nodules in the left lower lobe as measured on 4:109. Musculoskeletal: Subjective osteopenia. Chronic T6 compression fracture with moderate height loss. No acute finding CT ABDOMEN PELVIS FINDINGS Hepatobiliary: Small non worrisome cystic density in the central liver.No evidence of biliary obstruction or stone. Pancreas: Unremarkable. Spleen: Unremarkable. Adrenals/Urinary Tract: Negative adrenals. No hydronephrosis or stone. Subcentimeter cystic density at the lower pole left kidney. No follow-up imaging is recommended. Unremarkable bladder. Stomach/Bowel:  No  obstruction. No appendicitis. Vascular/Lymphatic: No acute vascular abnormality. Extensive and age advanced atheromatous plaque affecting the aorta no mass or adenopathy. Reproductive:No pathologic findings. Other: No ascites or pneumoperitoneum. Musculoskeletal: No acute abnormalities. Notable L3-4 and L5-S1 disc degeneration. IMPRESSION: 1. No acute finding or explanation for sepsis.  No appendicitis. 2. Age advanced atherosclerosis. 3. Small pulmonary nodules measuring up to 4 mm. No follow-up needed if patient is low-risk.This recommendation follows the consensus statement: Guidelines for Management of Incidental Pulmonary Nodules Detected on CT Images: From the Fleischner Society 2017; Radiology 2017; 284:228-243. Electronically Signed   By: Tiburcio Pea M.D.   On: 10/07/2022 07:04   CT Head Wo Contrast  Result Date: 10/07/2022 CLINICAL DATA:  Altered mental status in the setting of alcohol withdrawal. EXAM: CT HEAD WITHOUT CONTRAST TECHNIQUE: Contiguous axial images were obtained from the base of the skull through the vertex without intravenous contrast. RADIATION DOSE REDUCTION: This exam was performed according to the departmental dose-optimization program which includes automated exposure control, adjustment of the mA and/or kV according to patient size and/or use of iterative reconstruction technique. COMPARISON:  None Available. FINDINGS: Brain: No evidence of acute infarction, hemorrhage, hydrocephalus, extra-axial collection or mass lesion/mass effect. Vascular: No hyperdense vessel or unexpected calcification. Skull: Normal. Negative for fracture or focal lesion. Sinuses/Orbits: No acute finding. IMPRESSION: Negative head CT. Electronically Signed   By: Tiburcio Pea M.D.   On: 10/07/2022 06:57   DG Chest Port 1 View  Result Date: 10/07/2022 CLINICAL DATA:  Questionable sepsis EXAM: PORTABLE CHEST 1 VIEW COMPARISON:  06/23/2011 FINDINGS: Artifact from EKG leads. Normal heart size and  mediastinal contours when accounting for rotation. No acute infiltrate or edema. No effusion or pneumothorax. No acute osseous findings. IMPRESSION: No evidence of active disease. Electronically Signed   By: Tiburcio Pea M.D.   On: 10/07/2022 04:59    Assessment/Plan This is a pleasant 57 year old gentleman with a history of rheumatoid arthritis not on Biologics, pain controlled on oral tramadol through the pain management clinic who recently ran out of his tramadol and binged alcohol for the last 3 days prior to ER presentation.  He is now being admitted to the hospital with acute alcohol withdrawal, suspected narcotic withdrawal, and possible sepsis.  Septic shock-meeting criteria with leukocytosis, tachycardia, fever, initial lactate 7.0.  He has been hemodynamically stable, lactic acidosis improving with IV fluid administration.  While bacterial infection is not  strongly suspected, will treat empirically for now given the severity of his presentation.  Ultimately however this could certainly all be explained by severe alcohol withdrawal. -Inpatient admission to stepdown unit -Follow blood cultures -Continue IV fluid resuscitation -Trend lactate -Empiric IV vancomycin, IV cefepime, IV Flagyl-consider de-escalate antibiotics as patient stabilizes  Acute alcohol withdrawal-per patient and his sister, he is not a daily drinker but binged alcohol 6/16 to 6/18 due to running out of his tramadol.  He drank a full bottle of bourbon each of those days.  Presented with confusion, agitation, tachycardia.  Currently doing much better after receiving IV fluids and IV Ativan in the emergency department. -P.o. Librium taper -P.o. Ativan every hour per CIWA protocol -Continue multivitamin, thiamine, folate  Chronic pain from rheumatoid arthritis-unfortunately according to the patient and his sister, he has failed a plethora of DMARDs, and was told he is not a candidate for any other rheumatologic drugs.   Currently is seen by pain management at [facility], where he is prescribed up to five 50 mg tablets of tramadol per day. -Tramadol 50 mg p.o. as needed for pain every 6 hours  Hypokalemia-likely due to GI losses as he did vomit in the last couple of days -Potassium repleted orally in the ER -Recheck potassium with morning labs  Insomnia-trazodone 100 mg p.o. nightly as needed  DVT prophylaxis: Lovenox     Code Status: Full Code  Consults called: None  Admission status: The appropriate patient status for this patient is INPATIENT. Inpatient status is judged to be reasonable and necessary in order to provide the required intensity of service to ensure the patient's safety. The patient's presenting symptoms, physical exam findings, and initial radiographic and laboratory data in the context of their chronic comorbidities is felt to place them at high risk for further clinical deterioration. Furthermore, it is not anticipated that the patient will be medically stable for discharge from the hospital within 2 midnights of admission.    I certify that at the point of admission it is my clinical judgment that the patient will require inpatient hospital care spanning beyond 2 midnights from the point of admission due to high intensity of service, high risk for further deterioration and high frequency of surveillance required  Time spent: 62 minutes  Marketta Valadez Sharlette Dense MD [facility] Pager [phone]  If 7PM-7AM, please contact night-coverage [email or web address] Password [email or web address]  10/07/2022, 10:01 AM

## 2022-10-07 NOTE — Sepsis Progress Note (Signed)
Elink monitoring for the code sepsis protocol.  

## 2022-10-07 NOTE — ED Notes (Addendum)
Dr Sheldon at bedside  

## 2022-10-07 NOTE — ED Provider Notes (Signed)
  Provider Note MRN:  [id number]  Arrival date & time: 10/07/22    ED Course and Medical Decision Making  Assumed care from [facility] at shift change.  See note from prior team for complete details, in brief:  57 yo male Hx RA  Ran out tramadol so began drinking heavily to compensate 1 bottle bourbon every day last 3 days Tremulous/tachycardic/HR 130's on arrival, last etoh last night Abx/fluids/ciwa Code sepsis Unclear source WBC 20, LA 7, Etoh 33, low grade temp, HR 126  Plan per prior physician admit  Patient with SIRS, unclear source. CT head, chest and pelvis was without obvious source infection.  He is not altered. LA improved.  Pt with ongoing tachycardia, anxiousness, reported hallucinations earlier in the evening Will give phenobarb Continue CIWA SIRS at this point, no definite source Consider opiate withdrawal, etoh withdrawal  Will require admission CIWA improved     .Critical Care  Performed by: Sloan Leiter, DO Authorized by: Sloan Leiter, DO   Critical care provider statement:    Critical care time (minutes):  32   Critical care time was exclusive of:  Separately billable procedures and treating other patients   Critical care was necessary to treat or prevent imminent or life-threatening deterioration of the following conditions:  Circulatory failure   Critical care was time spent personally by me on the following activities:  Development of treatment plan with patient or surrogate, discussions with consultants, evaluation of patient's response to treatment, examination of patient, ordering and review of laboratory studies, ordering and review of radiographic studies, ordering and performing treatments and interventions, pulse oximetry, re-evaluation of patient's condition, review of old charts and obtaining history from patient or surrogate   Care discussed with: admitting provider     Final Clinical Impressions(s) / ED Diagnoses     ICD-10-CM   1.  Alcohol withdrawal syndrome with complication (HCC)  F10.939     2. SIRS (systemic inflammatory response syndrome) (HCC)  R65.10     3. Pulmonary nodule  R91.1     4. Hypokalemia  E87.6       ED Discharge Orders     None       Discharge Instructions   None        Sloan Leiter, DO 10/07/22 [phone]

## 2022-10-07 NOTE — Progress Notes (Signed)
Pharmacy Antibiotic Note  Erik Shaffer is a 57 y.o. male admitted on 10/07/2022 with possible sepsis.  Pharmacy has been consulted for cefepime and vancomycin dosing.  In ED, 6/20 0611 Vancomycin 1000 mg,  6/20 0541 cefepime 2 g, and  6/20 0529 Flagyl 500 mg administered  Plan: Continue cefepime 2 g IV every 8 hours Continue vancomycin 1250 mg IV every 12 hours (Goal AUC 400-550, eAUC 472.6, SCr used: 0.9) Monitor clinical progress, renal function, vancomycin levels as indicated F/U C&S, abx deescalation / LOT   Height: 6' (182.9 cm) Weight: 83.6 kg (184 lb 4.9 oz) IBW/kg (Calculated) : 77.6  Temp (24hrs), Avg:99.2 F (37.3 C), Min:98.7 F (37.1 C), Max:100.1 F (37.8 C)  Recent Labs  Lab 10/07/22 0439 10/07/22 0643 10/07/22 1001  WBC 20.8*  --   --   CREATININE 0.90  --   --   LATICACIDVEN 7.0* 3.8* 1.7    Estimated Creatinine Clearance: 100.6 mL/min (by C-G formula based on SCr of 0.9 mg/dL).    Allergies  Allergen Reactions   Tomato    Butrans [Buprenorphine]     fatigue    Microbiology results: 6/20 BCx: sent 6/20 MRSA PCR: not detected  Thank you for allowing pharmacy to be a part of this patient's care.  Lynden Ang, PharmD, BCPS 10/07/2022 11:13 AM

## 2022-10-07 NOTE — ED Triage Notes (Signed)
Patient arrives to ED POV CO Fevers, and AMS. Per family member states pt has is going through alcohol withdrawal. Pt states last drink was 24 hours ago and prior to that pt states he drank a bottle of bourbon every day for 3 days straight. Pt is warm to touch, repetitive questions, has moments of confusion and has tremors. A/O to self, time and place but not to situation.

## 2022-10-07 NOTE — ED Notes (Addendum)
Patient family requested to speak with Dr. Bernette Mayers before beginning antibiotics regarding patient WBC and dx of RA. This nurse made Dr. Bernette Mayers aware of request.

## 2022-10-07 NOTE — ED Notes (Signed)
[place] with  cl called for transport 

## 2022-10-08 DIAGNOSIS — D72829 Elevated white blood cell count, unspecified: Secondary | ICD-10-CM | POA: Diagnosis not present

## 2022-10-08 DIAGNOSIS — F1193 Opioid use, unspecified with withdrawal: Secondary | ICD-10-CM

## 2022-10-08 DIAGNOSIS — E872 Acidosis, unspecified: Secondary | ICD-10-CM | POA: Diagnosis not present

## 2022-10-08 DIAGNOSIS — F10931 Alcohol use, unspecified with withdrawal delirium: Secondary | ICD-10-CM | POA: Diagnosis not present

## 2022-10-08 DIAGNOSIS — R569 Unspecified convulsions: Secondary | ICD-10-CM

## 2022-10-08 LAB — MAGNESIUM: Magnesium: 1.8 mg/dL (ref 1.7–2.4)

## 2022-10-08 LAB — BASIC METABOLIC PANEL
Anion gap: 7 (ref 5–15)
BUN: 9 mg/dL (ref 6–20)
CO2: 23 mmol/L (ref 22–32)
Calcium: 8 mg/dL — ABNORMAL LOW (ref 8.9–10.3)
Chloride: 110 mmol/L (ref 98–111)
Creatinine, Ser: 0.71 mg/dL (ref 0.61–1.24)
GFR, Estimated: >60 mL/min (ref >60)
Glucose, Bld: 92 mg/dL (ref 70–99)
Potassium: 3.8 mmol/L (ref 3.5–5.1)
Sodium: 140 mmol/L (ref 135–145)

## 2022-10-08 LAB — CBC
HCT: 36.1 % — ABNORMAL LOW (ref 39.0–52.0)
Hemoglobin: 12 g/dL — ABNORMAL LOW (ref 13.0–17.0)
MCH: 29.3 pg (ref 26.0–34.0)
MCHC: 33.2 g/dL (ref 30.0–36.0)
MCV: 88.3 fL (ref 80.0–100.0)
Platelets: 327 10*3/uL (ref 150–400)
RBC: 4.09 MIL/uL — ABNORMAL LOW (ref 4.22–5.81)
RDW: 14.1 % (ref 11.5–15.5)
WBC: 11.2 10*3/uL — ABNORMAL HIGH (ref 4.0–10.5)
nRBC: 0 % (ref 0.0–0.2)

## 2022-10-08 LAB — LACTIC ACID, PLASMA: Lactic Acid, Venous: 0.7 mmol/L (ref 0.5–1.9)

## 2022-10-08 LAB — PROCALCITONIN: Procalcitonin: <0.1 ng/mL

## 2022-10-08 LAB — CULTURE, BLOOD (ROUTINE X 2)

## 2022-10-08 MED ORDER — TRAMADOL HCL 50 MG PO TABS
100.0000 mg | ORAL_TABLET | Freq: Every day | ORAL | Status: DC
  Administered 2022-10-08: 100 mg via ORAL
  Filled 2022-10-08: qty 2

## 2022-10-08 MED ORDER — MAGNESIUM SULFATE 2 GM/50ML IV SOLN
2.0000 g | Freq: Once | INTRAVENOUS | Status: AC
  Administered 2022-10-08: 2 g via INTRAVENOUS
  Filled 2022-10-08: qty 50

## 2022-10-08 MED ORDER — TRAMADOL HCL 50 MG PO TABS
100.0000 mg | ORAL_TABLET | Freq: Every day | ORAL | Status: DC
  Administered 2022-10-08 – 2022-10-09 (×2): 100 mg via ORAL
  Filled 2022-10-08 (×2): qty 2

## 2022-10-08 MED ORDER — TRAMADOL HCL 50 MG PO TABS
50.0000 mg | ORAL_TABLET | Freq: Every day | ORAL | Status: DC
  Administered 2022-10-08: 50 mg via ORAL
  Filled 2022-10-08: qty 1

## 2022-10-08 NOTE — Progress Notes (Signed)
PROGRESS NOTE    Erik Shaffer  ZOX:[id number] DOB: 09-22-1965 DOA: 10/07/2022 PCP: Sonny Masters, FNP    Brief Narrative:   Erik Shaffer is a 57 y.o. male with past medical history significant for severe rheumatoid arthritis intolerant to biologic therapy, chronic pain on tramadol who presented to [facility] ED on 6/20 via POV with confusion, fevers and concern for alcohol/narcotic withdrawal.  Patient apparently ran out of his tramadol and has been utilizing alcohol for treatment of his underlying condition over the last 3 days.  Family reports last drink was 24 hours prior to arrival and has now developed confusion associated with tremors and agitation.  Family does report that he has a prior history of narcotic addiction including cocaine but has been clean for several years.  He does not drink on a regular basis but from time to time when he runs out of his limited supply of tramadol he does tend to utilize alcohol for treatment of his symptoms.  In the ED, temperature 100.1 F, HR 126, RR 30, BP 155/83, SpO2 at 98% on room air.  WBC 20.8, hemoglobin 13.8, platelet count 523.  Sodium 134, potassium 3.1, chloride 96, CO2 19, glucose 189, BUN 14, creatinine 0.90.  Lipase 16.  AST 26, ALT 21, total bilirubin 0.5.  Lactic acid 7.0.  Influenza A/B, RSV, COVID PCR negative.  Urinalysis unrevealing.  EtOH level 33, salicylate level less than 7.0.  Chest x-ray with no evidence of active disease process.  CT head with no acute intracranial abnormality.  CT chest/abdomen/pelvis with no acute finding, notable small pulmonary nodules measuring up to 4 mm.  Patient was started on empiric antibiotics with vancomycin, cefepime, Flagyl, given lorazepam, phenobarbital, IV fluid bolus.  Assessment & Plan:   EtOH/narcotic withdrawal Alcohol intoxication. Lactic acidosis secondary to dehydration: Resolved Patient presenting to ED with confusion, agitation in the setting of running out of his home  tramadol and increased alcohol use to treat his underlying rheumatoid arthritis.  Last drink 24 hours prior to admission.  EtOH level elevated 33 on admission. -- CIWA protocol with symptom triggered Ativan -- Librium taper --Thiamine, folic acid, multivitamin -- TOC consult for subs abuse resources  Hypokalemia: Resolved Potassium 3.1 on admission, repleted.  Leukocytosis WBC count elevated 20.8 admission, likely reactive in the setting of severe dehydration in the setting of alcohol intoxication, withdrawal.  Initially placed on empiric antibiotics but was discontinued as no infectious source identified. -- WBC 20.8>11.2 -- Repeat CBC in the a.m.  Sepsis, ruled out Patient was initially started on empiric antibiotics with vancomycin, cefepime and Flagyl given concern of infectious etiology.  WBC count was elevated 20.8 but likely reactive in the setting of dehydration and alcohol withdrawal.  Urinalysis was unrevealing.  Chest x-ray negative, CT head negative, CT chest/abdomen/pelvis with no acute findings.  Procalcitonin within normal limits.  No focal findings to suggest infectious etiology and antibiotics were discontinued.  Rheumatoid arthritis Follows with rheumatology at [facility].  Been intolerant to DMARDs in the past apparently.  On tramadol 100 mg p.o. every morning/noon and 50 mg nightly.  UDS was negative on admission.  Outpatient follow-up with rheumatology.  Pulmonary nodules Incidental finding on CT chest of small pulmonologist measuring up to 4 mm.  Outpatient follow-up with PCP, likely will need further surveillance given tobacco use history.  Tobacco use disorder Patient continues to endorse 20 cigarettes/day, counseled on need for complete cessation/abstinence especially in the setting of pulmonary nodules on CT  as above. -- Nicotine patch   DVT prophylaxis: enoxaparin (LOVENOX) injection 40 mg Start: 10/07/22 2200 SCDs Start: 10/07/22 0947    Code Status:  Full Code Family Communication:   Disposition Plan:  Level of care: Stepdown Status is: Inpatient Remains inpatient appropriate because: Anticipate likely discharge home tomorrow if continues to improve    Consultants:  None  Procedures:  None  Antimicrobials:  Vancomycin 6/19 - 6/20 Cefepime 6/19 - 6/20  Metronidazole 6/19 - 6/20   Subjective: Patient seen examined bedside, resting comfortably.  Lying in bed.  Confusion now resolved.  Feels overall much improved.  Discussed with patient need to abstain from utilizing alcohol to treat his symptoms of rheumatoid arthritis and to ensure that his refills are appropriately accomplished with increased communication with his PCP.  Patient with no other questions or concerns at this time.  CIWA scores much improved.  Denies headache, no dizziness, no chest pain, no palpitations, no shortness of breath, no abdominal pain, no fever/chills/night sweats, no nausea/vomitus diarrhea, no focal weakness, no fatigue, no cough/congestion, no paresthesias.  No acute events overnight per nursing.  Stable for transfer to telemetry.  Objective: Vitals:   10/08/22 0800 10/08/22 0832 10/08/22 0900 10/08/22 1000  BP: 133/72  128/66 125/72  Pulse: 81  61 79  Resp: 17  19 20   Temp:  97.9 F (36.6 C)    TempSrc:      SpO2: 99%  97% 95%  Weight:      Height:        Intake/Output Summary (Last 24 hours) at 10/08/2022 1045 Last data filed at 10/08/2022 0915 Gross per 24 hour  Intake 2863.89 ml  Output 3075 ml  Net -211.11 ml   Filed Weights   10/07/22 0430 10/07/22 0920  Weight: 81.6 kg 83.6 kg    Examination:  Physical Exam: GEN: NAD, alert and oriented x 3, chronically ill appearance, appears older than stated age HEENT: NCAT, PERRL, EOMI, sclera clear, MMM PULM: CTAB w/o wheezes/crackles, normal respiratory effort, on room air CV: RRR w/o M/G/R GI: abd soft, NTND, NABS, no R/G/M MSK: no peripheral edema, muscle strength globally intact  5/5 bilateral upper/lower extremities NEURO: CN II-XII intact, no focal deficits, sensation to light touch intact tremor PSYCH: normal mood/affect Integumentary: No concerning rashes/lesions/wounds noted on exposed skin surfaces    Data Reviewed: I have personally reviewed following labs and imaging studies  CBC: Recent Labs  Lab 10/07/22 0439 10/08/22 0701  WBC 20.8* 11.2*  NEUTROABS 13.2*  --   HGB 15.8 12.0*  HCT 45.9 36.1*  MCV 84.4 88.3  PLT 523* 327   Basic Metabolic Panel: Recent Labs  Lab 10/07/22 0439 10/08/22 0308 10/08/22 0701  NA 134* 140  --   K 3.1* 3.8  --   CL 96* 110  --   CO2 19* 23  --   GLUCOSE 189* 92  --   BUN 14 9  --   CREATININE 0.90 0.71  --   CALCIUM 9.2 8.0*  --   MG  --   --  1.8   GFR: Estimated Creatinine Clearance: 113.2 mL/min (by C-G formula based on SCr of 0.71 mg/dL). Liver Function Tests: Recent Labs  Lab 10/07/22 0439  AST 26  ALT 21  ALKPHOS 97  BILITOT 0.5  PROT 8.1  ALBUMIN 4.5   Recent Labs  Lab 10/07/22 0439  LIPASE 16   No results for input(s): "AMMONIA" in the last 168 hours. Coagulation Profile: Recent Labs  Lab 10/07/22 0439  INR 1.1   Cardiac Enzymes: Recent Labs  Lab 10/07/22 0439  CKTOTAL 109   BNP (last 3 results) No results for input(s): "PROBNP" in the last 8760 hours. HbA1C: No results for input(s): "HGBA1C" in the last 72 hours. CBG: No results for input(s): "GLUCAP" in the last 168 hours. Lipid Profile: No results for input(s): "CHOL", "HDL", "LDLCALC", "TRIG", "CHOLHDL", "LDLDIRECT" in the last 72 hours. Thyroid Function Tests: No results for input(s): "TSH", "T4TOTAL", "FREET4", "T3FREE", "THYROIDAB" in the last 72 hours. Anemia Panel: No results for input(s): "VITAMINB12", "FOLATE", "FERRITIN", "TIBC", "IRON", "RETICCTPCT" in the last 72 hours. Sepsis Labs: Recent Labs  Lab 10/07/22 1610 10/07/22 1001 10/07/22 1244 10/08/22 0701  PROCALCITON  --   --   --  <0.10   LATICACIDVEN 3.8* 1.7 2.1* 0.7    Recent Results (from the past 240 hour(s))  Blood Culture (routine x 2)     Status: None (Preliminary result)   Collection Time: 10/07/22  4:40 AM   Specimen: BLOOD  Result Value Ref Range Status   Specimen Description   Final    BLOOD BLOOD LEFT FOREARM Performed at [facility], [address] [place], [place] [address]    Special Requests   Final    BOTTLES DRAWN AEROBIC AND ANAEROBIC Blood Culture adequate volume Performed at [facility], [address] [place], [place] [address]    Culture   Final    NO GROWTH < 24 HOURS Performed at [facility], [address], [place], [place] [address]    Report Status PENDING  Incomplete  Resp panel by RT-PCR (RSV, Flu A&B, Covid) Anterior Nasal Swab     Status: None   Collection Time: 10/07/22  4:40 AM   Specimen: Anterior Nasal Swab  Result Value Ref Range Status   SARS Coronavirus 2 by RT PCR NEGATIVE NEGATIVE Final    Comment: (NOTE) SARS-CoV-2 target nucleic acids are NOT DETECTED.  The SARS-CoV-2 RNA is generally detectable in upper respiratory specimens during the acute phase of infection. The lowest concentration of SARS-CoV-2 viral copies this assay can detect is 138 copies/mL. A negative result does not preclude SARS-Cov-2 infection and should not be used as the sole basis for treatment or other patient management decisions. A negative result may occur with  improper specimen collection/handling, submission of specimen other than nasopharyngeal swab, presence of viral mutation(s) within the areas targeted by this assay, and inadequate number of viral copies(<138 copies/mL). A negative result must be combined with clinical observations, patient history, and epidemiological information. The expected result is Negative.  Fact Sheet for Patients:  BloggerCourse.com  Fact Sheet for Healthcare Providers:   SeriousBroker.it  This test is no t yet approved or cleared by the [place] FDA and  has been authorized for detection and/or diagnosis of SARS-CoV-2 by FDA under an Emergency Use Authorization (EUA). This EUA will remain  in effect (meaning this test can be used) for the duration of the COVID-19 declaration under Section 564(b)(1) of the Act, 21 U.S.C.section 360bbb-3(b)(1), unless the authorization is terminated  or revoked sooner.       Influenza A by PCR NEGATIVE NEGATIVE Final   Influenza B by PCR NEGATIVE NEGATIVE Final    Comment: (NOTE) The Xpert Xpress SARS-CoV-2/FLU/RSV plus assay is intended as an aid in the diagnosis of influenza from Nasopharyngeal swab specimens and should not be used as a sole basis for treatment. Nasal washings and aspirates are unacceptable for  Xpert Xpress SARS-CoV-2/FLU/RSV testing.  Fact Sheet for Patients: BloggerCourse.com  Fact Sheet for Healthcare Providers: SeriousBroker.it  This test is not yet approved or cleared by the [place] FDA and has been authorized for detection and/or diagnosis of SARS-CoV-2 by FDA under an Emergency Use Authorization (EUA). This EUA will remain in effect (meaning this test can be used) for the duration of the COVID-19 declaration under Section 564(b)(1) of the Act, 21 U.S.C. section 360bbb-3(b)(1), unless the authorization is terminated or revoked.     Resp Syncytial Virus by PCR NEGATIVE NEGATIVE Final    Comment: (NOTE) Fact Sheet for Patients: BloggerCourse.com  Fact Sheet for Healthcare Providers: SeriousBroker.it  This test is not yet approved or cleared by the [place] FDA and has been authorized for detection and/or diagnosis of SARS-CoV-2 by FDA under an Emergency Use Authorization (EUA). This EUA will remain in effect (meaning this test can be used) for  the duration of the COVID-19 declaration under Section 564(b)(1) of the Act, 21 U.S.C. section 360bbb-3(b)(1), unless the authorization is terminated or revoked.  Performed at [facility], [address], [place], [place] [address]   Blood Culture (routine x 2)     Status: None (Preliminary result)   Collection Time: 10/07/22  4:45 AM   Specimen: BLOOD  Result Value Ref Range Status   Specimen Description   Final    BLOOD BLOOD RIGHT FOREARM Performed at [facility], [address], [place], [place] [address]    Special Requests   Final    BOTTLES DRAWN AEROBIC AND ANAEROBIC Blood Culture adequate volume Performed at [facility], [address] [place], [place] [address]    Culture   Final    NO GROWTH < 24 HOURS Performed at [facility], [address], [place], [place] [address]    Report Status PENDING  Incomplete  MRSA Next Gen by PCR, Nasal     Status: None   Collection Time: 10/07/22  9:23 AM   Specimen: Nasal Mucosa; Nasal Swab  Result Value Ref Range Status   MRSA by PCR Next Gen NOT DETECTED NOT DETECTED Final    Comment: (NOTE) The GeneXpert MRSA Assay (FDA approved for NASAL specimens only), is one component of a comprehensive MRSA colonization surveillance program. It is not intended to diagnose MRSA infection nor to guide or monitor treatment for MRSA infections. Test performance is not FDA approved in patients less than 31 years old. Performed at [facility], [address] [place], [place] [address]          Radiology Studies: CT CHEST ABDOMEN PELVIS W CONTRAST  Result Date: 10/07/2022 CLINICAL DATA:  Sepsis. Fever and tachycardia with right lower quadrant pain. Alcohol withdrawal. Evaluate for appendicitis. EXAM: CT CHEST, ABDOMEN, AND PELVIS WITH CONTRAST TECHNIQUE: Multidetector CT imaging of the chest, abdomen and pelvis was performed following the standard  protocol during bolus administration of intravenous contrast. RADIATION DOSE REDUCTION: This exam was performed according to the departmental dose-optimization program which includes automated exposure control, adjustment of the mA and/or kV according to patient size and/or use of iterative reconstruction technique. CONTRAST:  OMNIPAQUE IOHEXOL 300 MG/ML  SOLN COMPARISON:  None Available. FINDINGS: CT CHEST FINDINGS Cardiovascular: No significant vascular findings. Normal heart size. No pericardial effusion. Mediastinum/Nodes: Negative for mass or adenopathy Lungs/Pleura: There is no edema, consolidation, effusion, or pneumothorax. 4 mm adjacent pulmonary nodules in the left lower lobe as measured on 4:109. Musculoskeletal: Subjective osteopenia.  Chronic T6 compression fracture with moderate height loss. No acute finding CT ABDOMEN PELVIS FINDINGS Hepatobiliary: Small non worrisome cystic density in the central liver.No evidence of biliary obstruction or stone. Pancreas: Unremarkable. Spleen: Unremarkable. Adrenals/Urinary Tract: Negative adrenals. No hydronephrosis or stone. Subcentimeter cystic density at the lower pole left kidney. No follow-up imaging is recommended. Unremarkable bladder. Stomach/Bowel:  No obstruction. No appendicitis. Vascular/Lymphatic: No acute vascular abnormality. Extensive and age advanced atheromatous plaque affecting the aorta no mass or adenopathy. Reproductive:No pathologic findings. Other: No ascites or pneumoperitoneum. Musculoskeletal: No acute abnormalities. Notable L3-4 and L5-S1 disc degeneration. IMPRESSION: 1. No acute finding or explanation for sepsis.  No appendicitis. 2. Age advanced atherosclerosis. 3. Small pulmonary nodules measuring up to 4 mm. No follow-up needed if patient is low-risk.This recommendation follows the consensus statement: Guidelines for Management of Incidental Pulmonary Nodules Detected on CT Images: From the Fleischner Society 2017; Radiology  2017; 284:228-243. Electronically Signed   By: Tiburcio Pea M.D.   On: 10/07/2022 07:04   CT Head Wo Contrast  Result Date: 10/07/2022 CLINICAL DATA:  Altered mental status in the setting of alcohol withdrawal. EXAM: CT HEAD WITHOUT CONTRAST TECHNIQUE: Contiguous axial images were obtained from the base of the skull through the vertex without intravenous contrast. RADIATION DOSE REDUCTION: This exam was performed according to the departmental dose-optimization program which includes automated exposure control, adjustment of the mA and/or kV according to patient size and/or use of iterative reconstruction technique. COMPARISON:  None Available. FINDINGS: Brain: No evidence of acute infarction, hemorrhage, hydrocephalus, extra-axial collection or mass lesion/mass effect. Vascular: No hyperdense vessel or unexpected calcification. Skull: Normal. Negative for fracture or focal lesion. Sinuses/Orbits: No acute finding. IMPRESSION: Negative head CT. Electronically Signed   By: Tiburcio Pea M.D.   On: 10/07/2022 06:57   DG Chest Port 1 View  Result Date: 10/07/2022 CLINICAL DATA:  Questionable sepsis EXAM: PORTABLE CHEST 1 VIEW COMPARISON:  06/23/2011 FINDINGS: Artifact from EKG leads. Normal heart size and mediastinal contours when accounting for rotation. No acute infiltrate or edema. No effusion or pneumothorax. No acute osseous findings. IMPRESSION: No evidence of active disease. Electronically Signed   By: Tiburcio Pea M.D.   On: 10/07/2022 04:59        Scheduled Meds:  chlordiazePOXIDE  25 mg Oral TID   Followed by   Melene Muller ON 10/09/2022] chlordiazePOXIDE  25 mg Oral BH-qamhs   Followed by   Melene Muller ON 10/10/2022] chlordiazePOXIDE  25 mg Oral Daily   Chlorhexidine Gluconate Cloth  6 each Topical Q2200   enoxaparin (LOVENOX) injection  40 mg Subcutaneous Q24H   multivitamin with minerals  1 tablet Oral Daily   nicotine  14 mg Transdermal Daily   thiamine  100 mg Oral Daily   Or    thiamine  100 mg Intravenous Daily   traMADol  100 mg Oral Q breakfast   traMADol  100 mg Oral Q1200   traMADol  50 mg Oral QHS   Continuous Infusions:   LOS: 1 day    Time spent: 52 minutes spent on chart review, discussion with nursing staff, consultants, updating family and interview/physical exam; more than 50% of that time was spent in counseling and/or coordination of care.    Alvira Philips [place], DO [facility] Available via Epic secure chat 7am-7pm After these hours, please refer to coverage provider listed on [email or web address] 10/08/2022, 10:45 AM

## 2022-10-09 DIAGNOSIS — F1193 Opioid use, unspecified with withdrawal: Secondary | ICD-10-CM | POA: Diagnosis not present

## 2022-10-09 DIAGNOSIS — F10931 Alcohol use, unspecified with withdrawal delirium: Secondary | ICD-10-CM | POA: Diagnosis not present

## 2022-10-09 LAB — CBC
HCT: 36.8 % — ABNORMAL LOW (ref 39.0–52.0)
Hemoglobin: 11.8 g/dL — ABNORMAL LOW (ref 13.0–17.0)
MCH: 28.9 pg (ref 26.0–34.0)
MCHC: 32.1 g/dL (ref 30.0–36.0)
MCV: 90 fL (ref 80.0–100.0)
Platelets: 290 10*3/uL (ref 150–400)
RBC: 4.09 MIL/uL — ABNORMAL LOW (ref 4.22–5.81)
RDW: 14.5 % (ref 11.5–15.5)
WBC: 8.7 10*3/uL (ref 4.0–10.5)
nRBC: 0 % (ref 0.0–0.2)

## 2022-10-09 LAB — BASIC METABOLIC PANEL
Anion gap: 7 (ref 5–15)
BUN: <5 mg/dL — ABNORMAL LOW (ref 6–20)
CO2: 22 mmol/L (ref 22–32)
Calcium: 8 mg/dL — ABNORMAL LOW (ref 8.9–10.3)
Chloride: 106 mmol/L (ref 98–111)
Creatinine, Ser: 0.7 mg/dL (ref 0.61–1.24)
GFR, Estimated: >60 mL/min (ref >60)
Glucose, Bld: 89 mg/dL (ref 70–99)
Potassium: 3.5 mmol/L (ref 3.5–5.1)
Sodium: 135 mmol/L (ref 135–145)

## 2022-10-09 LAB — CULTURE, BLOOD (ROUTINE X 2): Culture: NO GROWTH

## 2022-10-09 LAB — MAGNESIUM: Magnesium: 2 mg/dL (ref 1.7–2.4)

## 2022-10-09 MED ORDER — CHLORDIAZEPOXIDE HCL 25 MG PO CAPS: ORAL_CAPSULE | ORAL | 0 refills | Status: AC

## 2022-10-09 MED ORDER — TRAMADOL HCL 50 MG PO TABS: 50.0000 mg | ORAL_TABLET | ORAL | 0 refills | Status: AC

## 2022-10-09 MED ORDER — GABAPENTIN 100 MG PO CAPS: 100.0000 mg | ORAL_CAPSULE | Freq: Three times a day (TID) | ORAL | 0 refills | Status: DC

## 2022-10-09 NOTE — TOC Initial Note (Signed)
Transition of Care [facility]) - Initial/Assessment Note    Patient Details  Name: Erik Shaffer MRN: [id number] Date of Birth: February 17, 1966  Transition of Care [facility]) CM/SW Contact:    Adrian Prows, RN Phone Number: 10/09/2022, 10:33 AM  Clinical Narrative:                 [facility] consult for SA education/counseling; spoke w/ pt in room; pt says he is from home and he plans to return at d/c; he identified POC Laurence Aly (sister) [phone]; he has transportation; pt says he has upper partial; he does not have DME, HH services, or home oxygen; he denies IPV, food/housing insecurity, or difficulty paying utilities; pt declines resource for SA; no TOC needs,  Expected Discharge Plan: Home/Self Care Barriers to Discharge: No Barriers Identified   Patient Goals and CMS Choice Patient states their goals for this hospitalization and ongoing recovery are:: home   Choice offered to / list presented to : NA      Expected Discharge Plan and Services   Discharge Planning Services: CM Consult Post Acute Care Choice: NA Living arrangements for the past 2 months: Single Family Home Expected Discharge Date: 10/09/22               DME Arranged: N/A DME Agency: NA       HH Arranged: NA HH Agency: NA        Prior Living Arrangements/Services Living arrangements for the past 2 months: Single Family Home Lives with:: Self Patient language and need for interpreter reviewed:: Yes Do you feel safe going back to the place where you live?: Yes      Need for Family Participation in Patient Care: Yes (Comment) Care giver support system in place?: Yes (comment) Current home services: Other (comment) (n/a) Criminal Activity/Legal Involvement Pertinent to Current Situation/Hospitalization: No - Comment as needed  Activities of Daily Living Home Assistive Devices/Equipment: Eyeglasses ADL Screening (condition at time of admission) Patient's cognitive ability adequate to safely complete  daily activities?: Yes Is the patient deaf or have difficulty hearing?: Yes Does the patient have difficulty seeing, even when wearing glasses/contacts?: No Does the patient have difficulty concentrating, remembering, or making decisions?: No Patient able to express need for assistance with ADLs?: Yes Does the patient have difficulty dressing or bathing?: Yes Independently performs ADLs?: Yes (appropriate for developmental age) Does the patient have difficulty walking or climbing stairs?: Yes Weakness of Legs: Both Weakness of Arms/Hands: Both  Permission Sought/Granted Permission sought to share information with : Case Manager Permission granted to share information with : Yes, Verbal Permission Granted  Share Information with NAME: Case Manager     Permission granted to share info w Relationship: Laurence Aly (sister) [phone]     Emotional Assessment Appearance:: Appears stated age Attitude/Demeanor/Rapport: Gracious Affect (typically observed): Accepting Orientation: : Oriented to Self, Oriented to Place, Oriented to  Time, Oriented to Situation Alcohol / Substance Use: Alcohol Use Psych Involvement: No (comment)  Admission diagnosis:  Alcohol withdrawal (HCC) [F10.939] Hypokalemia [E87.6] Pulmonary nodule [R91.1] SIRS (systemic inflammatory response syndrome) (HCC) [R65.10] Alcohol withdrawal syndrome with complication [facility]) [F10.939] Patient Active Problem List   Diagnosis Date Noted   Alcohol withdrawal (HCC) 10/07/2022   Vitamin D deficiency 03/19/2022   Degeneration of lumbar intervertebral disc 06/04/2021   Osteoarthritis 06/04/2021   Polyarthralgia 06/04/2021   Facial eczema 06/04/2021   Restless leg 02/26/2021   Insomnia due to medical condition 02/26/2021   Chronic pain disorder 01/14/2021  GERD (gastroesophageal reflux disease) 01/14/2021   IDA (iron deficiency anemia) 01/14/2021   MGUS (monoclonal gammopathy of unknown significance) 01/14/2021    Essential hypertension 08/02/2016   Hyperlipidemia 03/02/2016   Rheumatoid arthritis (HCC) 03/01/2016   Mood disorder (HCC) 03/01/2016   Cervical vertebral fusion syndrome 06/14/2012   ED (erectile dysfunction) 01/20/2012   DJD (degenerative joint disease) of knee 10/13/2011   Tobacco abuse 03/10/2011   PCP:  Sonny Masters, FNP Pharmacy:   [facility] [phone], [place] - [address] [place] [address] Phone: [phone] Fax: [phone]     Social Determinants of Health (SDOH) Social History: SDOH Screenings   Food Insecurity: No Food Insecurity (10/07/2022)  Housing: Low Risk  (10/07/2022)  Transportation Needs: No Transportation Needs (10/07/2022)  Utilities: Not At Risk (10/07/2022)  Alcohol Screen: Low Risk  (03/23/2021)  Depression (PHQ2-9): Medium Risk (03/19/2022)  Financial Resource Strain: Low Risk  (03/23/2021)  Physical Activity: Insufficiently Active (03/23/2021)  Social Connections: Socially Isolated (03/23/2021)  Stress: No Stress Concern Present (03/23/2021)  Tobacco Use: High Risk (10/07/2022)   SDOH Interventions:     Readmission Risk Interventions     No data to display

## 2022-10-09 NOTE — Discharge Summary (Signed)
Physician Discharge Summary  Erik Shaffer:[id number] DOB: Jul 05, 1965 DOA: 10/07/2022  PCP: Sonny Masters, FNP  Admit date: 10/07/2022 Discharge date: 10/09/2022  Admitted From: Home Disposition: Home  Recommendations for Outpatient Follow-up:  Follow up with PCP in 1-2 weeks Referral to pulmonology outpatient for cancer screening given pulmonary nodules placed Continue Librium taper on discharge Started on gabapentin x 14 days for alcohol use disorder Given refill for 7-day supply of his home tramadol which he has run out Can do encourage alcohol cessation Recommend close outpatient follow-up with his rheumatologist given poorly controlled rheumatoid arthritis  Home Health: No Equipment/Devices: None  Discharge Condition: Stable CODE STATUS: Full code Diet recommendation: Regular diet  History of present illness:  Erik Shaffer is a 57 y.o. male with past medical history significant for severe rheumatoid arthritis intolerant to biologic therapy, chronic pain on tramadol who presented to [facility] ED on 6/20 via POV with confusion, fevers and concern for alcohol/narcotic withdrawal.  Patient apparently ran out of his tramadol and has been utilizing alcohol for treatment of his underlying condition over the last 3 days.  Family reports last drink was 24 hours prior to arrival and has now developed confusion associated with tremors and agitation.  Family does report that he has a prior history of narcotic addiction including cocaine but has been clean for several years.  He does not drink on a regular basis but from time to time when he runs out of his limited supply of tramadol he does tend to utilize alcohol for treatment of his symptoms.   In the ED, temperature 100.1 F, HR 126, RR 30, BP 155/83, SpO2 at 98% on room air.  WBC 20.8, hemoglobin 13.8, platelet count 523.  Sodium 134, potassium 3.1, chloride 96, CO2 19, glucose 189, BUN 14, creatinine 0.90.  Lipase 16.  AST  26, ALT 21, total bilirubin 0.5.  Lactic acid 7.0.  Influenza A/B, RSV, COVID PCR negative.  Urinalysis unrevealing.  EtOH level 33, salicylate level less than 7.0.  Chest x-ray with no evidence of active disease process.  CT head with no acute intracranial abnormality.  CT chest/abdomen/pelvis with no acute finding, notable small pulmonary nodules measuring up to 4 mm.  Patient was started on empiric antibiotics with vancomycin, cefepime, Flagyl, given lorazepam, phenobarbital, IV fluid bolus.  Hospital course:  EtOH/narcotic withdrawal Alcohol intoxication. Lactic acidosis secondary to dehydration: Resolved Patient presenting to ED with confusion, agitation in the setting of running out of his home tramadol and increased alcohol use to treat his underlying rheumatoid arthritis.  Last drink 24 hours prior to admission.  EtOH level elevated 33 on admission.  Patient was admitted and placed under CIWA protocol with initiation of Librium taper.  Patient improved well and discussed with him need for complete cessation in regards to alcohol use.  Patient reports he ran out of his home tramadol and has upcoming appointments with his PCP for refill, will give 7-day supply of tramadol until his next appointment.   Hypokalemia: Resolved Pleated on admission.   Leukocytosis solved WBC count elevated 20.8 admission, likely reactive in the setting of severe dehydration in the setting of alcohol intoxication, withdrawal.  Initially placed on empiric antibiotics but was discontinued as no infectious source identified.  With IV fluid hydration, WBC count improved to 8.7 at time of discharge.   Sepsis, ruled out Patient was initially started on empiric antibiotics with vancomycin, cefepime and Flagyl given concern of infectious etiology.  WBC count  was elevated 20.8 but likely reactive in the setting of dehydration and alcohol withdrawal.  Urinalysis was unrevealing.  Chest x-ray negative, CT head negative, CT  chest/abdomen/pelvis with no acute findings.  Procalcitonin within normal limits.  No focal findings to suggest infectious etiology and antibiotics were discontinued.   Rheumatoid arthritis Follows with rheumatology at [facility].  Been intolerant to DMARDs in the past apparently.  On tramadol 100 mg p.o. every morning/noon and 50 mg nightly.  UDS was negative on admission.  Outpatient follow-up with rheumatology needs more optimal control of his underlying disease process.   Pulmonary nodules Incidental finding on CT chest of small pulmonologist measuring up to 4 mm.  Outpatient follow-up with PCP, likely will need further surveillance given tobacco use history.  Referral placed for cancer screening to pulmonology outpatient.   Tobacco use disorder Patient continues to endorse 20 cigarettes/day, counseled on need for complete cessation/abstinence especially in the setting of pulmonary nodules on CT as above.     Discharge Diagnoses:  Principal Problem:   Alcohol withdrawal [facility])    Discharge Instructions  Discharge Instructions     Ambulatory Referral for Lung Cancer Scre   Complete by: As directed    Call MD for:  difficulty breathing, headache or visual disturbances   Complete by: As directed    Call MD for:  extreme fatigue   Complete by: As directed    Call MD for:  persistant dizziness or light-headedness   Complete by: As directed    Call MD for:  persistant nausea and vomiting   Complete by: As directed    Call MD for:  severe uncontrolled pain   Complete by: As directed    Call MD for:  temperature >100.4   Complete by: As directed    Diet - low sodium heart healthy   Complete by: As directed    Increase activity slowly   Complete by: As directed       Allergies as of 10/09/2022       Reactions   Butrans [buprenorphine] Other (See Comments)   Fatigue    Eucrisa [crisaborole] Rash, Other (See Comments)   Made the skin red and broken out         Medication List     STOP taking these medications    Aleve 220 MG tablet Generic drug: naproxen sodium   Eucrisa 2 % Oint Generic drug: Crisaborole   naproxen 500 MG tablet Commonly known as: NAPROSYN   ondansetron 4 MG disintegrating tablet Commonly known as: ZOFRAN-ODT       TAKE these medications    chlordiazePOXIDE 25 MG capsule Commonly known as: LIBRIUM Take 1 capsule (25 mg total) by mouth in the morning and at bedtime for 2 days, THEN 1 capsule (25 mg total) daily for 2 days. Start taking on: October 09, 2022   gabapentin 100 MG capsule Commonly known as: Neurontin Take 1 capsule (100 mg total) by mouth 3 (three) times daily for 14 days.   Orajel 2X Toothache & Gum 20-0.26 % Gel Generic drug: Benzocaine-Menthol Place 1 application  onto teeth See admin instructions. Apply to affected tooth three times a day as needed for pain   tiZANidine 2 MG tablet Commonly known as: ZANAFLEX Take 1 tablet (2 mg total) by mouth 2 (two) times daily. What changed:  when to take this reasons to take this   traMADol 50 MG tablet Commonly known as: ULTRAM Take 1-2 tablets (50-100 mg total) by mouth See  admin instructions. Take 100 mg by mouth in the morning, 100 mg late afternoon, and 50 mg at bedtime   traZODone 100 MG tablet Commonly known as: DESYREL Take 2 tablets (200 mg total) by mouth at bedtime as needed for sleep. What changed: how much to take   triamcinolone cream 0.1 % Commonly known as: KENALOG Apply 1 application. topically 2 (two) times daily. What changed:  when to take this reasons to take this   TYLENOL 500 MG tablet Generic drug: acetaminophen Take 500 mg by mouth every 6 (six) hours as needed for mild pain or headache.   Vitamin D (Ergocalciferol) 1.25 MG (50000 UNIT) Caps capsule Commonly known as: DRISDOL Take 1 capsule (50,000 Units total) by mouth every 7 (seven) days.        Follow-up Information     Rakes, Doralee Albino, FNP. Schedule an  appointment as soon as possible for a visit in 1 week(s).   Specialty: Family Medicine Contact information: [address] [place] [address] [phone]                Allergies  Allergen Reactions   Butrans [Buprenorphine] Other (See Comments)    Fatigue    Eucrisa [Crisaborole] Rash and Other (See Comments)    Made the skin red and broken out    Consultations: None   Procedures/Studies: CT CHEST ABDOMEN PELVIS W CONTRAST  Result Date: 10/07/2022 CLINICAL DATA:  Sepsis. Fever and tachycardia with right lower quadrant pain. Alcohol withdrawal. Evaluate for appendicitis. EXAM: CT CHEST, ABDOMEN, AND PELVIS WITH CONTRAST TECHNIQUE: Multidetector CT imaging of the chest, abdomen and pelvis was performed following the standard protocol during bolus administration of intravenous contrast. RADIATION DOSE REDUCTION: This exam was performed according to the departmental dose-optimization program which includes automated exposure control, adjustment of the mA and/or kV according to patient size and/or use of iterative reconstruction technique. CONTRAST:  OMNIPAQUE IOHEXOL 300 MG/ML  SOLN COMPARISON:  None Available. FINDINGS: CT CHEST FINDINGS Cardiovascular: No significant vascular findings. Normal heart size. No pericardial effusion. Mediastinum/Nodes: Negative for mass or adenopathy Lungs/Pleura: There is no edema, consolidation, effusion, or pneumothorax. 4 mm adjacent pulmonary nodules in the left lower lobe as measured on 4:109. Musculoskeletal: Subjective osteopenia. Chronic T6 compression fracture with moderate height loss. No acute finding CT ABDOMEN PELVIS FINDINGS Hepatobiliary: Small non worrisome cystic density in the central liver.No evidence of biliary obstruction or stone. Pancreas: Unremarkable. Spleen: Unremarkable. Adrenals/Urinary Tract: Negative adrenals. No hydronephrosis or stone. Subcentimeter cystic density at the lower pole left kidney. No follow-up imaging  is recommended. Unremarkable bladder. Stomach/Bowel:  No obstruction. No appendicitis. Vascular/Lymphatic: No acute vascular abnormality. Extensive and age advanced atheromatous plaque affecting the aorta no mass or adenopathy. Reproductive:No pathologic findings. Other: No ascites or pneumoperitoneum. Musculoskeletal: No acute abnormalities. Notable L3-4 and L5-S1 disc degeneration. IMPRESSION: 1. No acute finding or explanation for sepsis.  No appendicitis. 2. Age advanced atherosclerosis. 3. Small pulmonary nodules measuring up to 4 mm. No follow-up needed if patient is low-risk.This recommendation follows the consensus statement: Guidelines for Management of Incidental Pulmonary Nodules Detected on CT Images: From the Fleischner Society 2017; Radiology 2017; 284:228-243. Electronically Signed   By: Tiburcio Pea M.D.   On: 10/07/2022 07:04   CT Head Wo Contrast  Result Date: 10/07/2022 CLINICAL DATA:  Altered mental status in the setting of alcohol withdrawal. EXAM: CT HEAD WITHOUT CONTRAST TECHNIQUE: Contiguous axial images were obtained from the base of the skull through the vertex without  intravenous contrast. RADIATION DOSE REDUCTION: This exam was performed according to the departmental dose-optimization program which includes automated exposure control, adjustment of the mA and/or kV according to patient size and/or use of iterative reconstruction technique. COMPARISON:  None Available. FINDINGS: Brain: No evidence of acute infarction, hemorrhage, hydrocephalus, extra-axial collection or mass lesion/mass effect. Vascular: No hyperdense vessel or unexpected calcification. Skull: Normal. Negative for fracture or focal lesion. Sinuses/Orbits: No acute finding. IMPRESSION: Negative head CT. Electronically Signed   By: Tiburcio Pea M.D.   On: 10/07/2022 06:57   DG Chest Port 1 View  Result Date: 10/07/2022 CLINICAL DATA:  Questionable sepsis EXAM: PORTABLE CHEST 1 VIEW COMPARISON:  06/23/2011  FINDINGS: Artifact from EKG leads. Normal heart size and mediastinal contours when accounting for rotation. No acute infiltrate or edema. No effusion or pneumothorax. No acute osseous findings. IMPRESSION: No evidence of active disease. Electronically Signed   By: Tiburcio Pea M.D.   On: 10/07/2022 04:59     Subjective: Patient seen examined bedside, resting calmly.  Sitting in bedside chair eating breakfast.  No specific complaints this morning.  Symptoms that are present admission now resolved.  CIWA scores now minimal.  Discussed with patient needs complete cessation from alcohol and needs to comply with his outpatient pain regimen.  Discussed will refill his tramadol for 7 days with upcoming PCP appointment next week.  Appreciative all the care he has received.  Also refer to outpatient pulmonology given pulmonary nodules for further surveillance given his significant tobacco abuse.  Denies headache, no dizziness, no chest pain, no palpitations, no shortness of breath, no abdominal pain, no fever/chills/night sweats, no nausea/vomiting/diarrhea, no focal weakness, no fatigue, no cough/congestion, no paresthesias.  No acute events overnight per nursing staff.  Discharge Exam: Vitals:   10/09/22 0124 10/09/22 0522  BP: 130/81 (!) 112/57  Pulse: 92 82  Resp: 17 16  Temp: 98.3 F (36.8 C) 98.4 F (36.9 C)  SpO2: 97% 97%   Vitals:   10/08/22 1800 10/08/22 1946 10/09/22 0124 10/09/22 0522  BP: 136/82 (!) 156/97 130/81 (!) 112/57  Pulse: 95 96 92 82  Resp:  16 17 16   Temp:  98.3 F (36.8 C) 98.3 F (36.8 C) 98.4 F (36.9 C)  TempSrc:  Oral Oral Oral  SpO2:  100% 97% 97%  Weight:      Height:        Physical Exam: GEN: NAD, alert and oriented x 3, chronically ill appearance, appears older than stated age HEENT: NCAT, PERRL, EOMI, sclera clear, MMM PULM: CTAB w/o wheezes/crackles, normal respiratory effort, on room air CV: RRR w/o M/G/R GI: abd soft, NTND, NABS, no R/G/M MSK: no  peripheral edema, muscle strength globally intact 5/5 bilateral upper/lower extremities NEURO: CN II-XII intact, no focal deficits, sensation to light touch intact PSYCH: normal mood/affect Integumentary: dry/intact, no rashes or wounds    The results of significant diagnostics from this hospitalization (including imaging, microbiology, ancillary and laboratory) are listed below for reference.     Microbiology: Recent Results (from the past 240 hour(s))  Blood Culture (routine x 2)     Status: None (Preliminary result)   Collection Time: 10/07/22  4:40 AM   Specimen: BLOOD  Result Value Ref Range Status   Specimen Description   Final    BLOOD BLOOD LEFT FOREARM Performed at [facility], [address] [place], [place] [address]    Special Requests   Final    BOTTLES DRAWN AEROBIC AND ANAEROBIC Blood Culture  adequate volume Performed at [facility], [address] [place], [place] [address]    Culture   Final    NO GROWTH 2 DAYS Performed at [facility], [address] [place], [place] [address]    Report Status PENDING  Incomplete  Resp panel by RT-PCR (RSV, Flu A&B, Covid) Anterior Nasal Swab     Status: None   Collection Time: 10/07/22  4:40 AM   Specimen: Anterior Nasal Swab  Result Value Ref Range Status   SARS Coronavirus 2 by RT PCR NEGATIVE NEGATIVE Final    Comment: (NOTE) SARS-CoV-2 target nucleic acids are NOT DETECTED.  The SARS-CoV-2 RNA is generally detectable in upper respiratory specimens during the acute phase of infection. The lowest concentration of SARS-CoV-2 viral copies this assay can detect is 138 copies/mL. A negative result does not preclude SARS-Cov-2 infection and should not be used as the sole basis for treatment or other patient management decisions. A negative result may occur with  improper specimen collection/handling, submission of specimen other than nasopharyngeal swab, presence of viral  mutation(s) within the areas targeted by this assay, and inadequate number of viral copies(<138 copies/mL). A negative result must be combined with clinical observations, patient history, and epidemiological information. The expected result is Negative.  Fact Sheet for Patients:  BloggerCourse.com  Fact Sheet for Healthcare Providers:  SeriousBroker.it  This test is no t yet approved or cleared by the [place] FDA and  has been authorized for detection and/or diagnosis of SARS-CoV-2 by FDA under an Emergency Use Authorization (EUA). This EUA will remain  in effect (meaning this test can be used) for the duration of the COVID-19 declaration under Section 564(b)(1) of the Act, 21 U.S.C.section 360bbb-3(b)(1), unless the authorization is terminated  or revoked sooner.       Influenza A by PCR NEGATIVE NEGATIVE Final   Influenza B by PCR NEGATIVE NEGATIVE Final    Comment: (NOTE) The Xpert Xpress SARS-CoV-2/FLU/RSV plus assay is intended as an aid in the diagnosis of influenza from Nasopharyngeal swab specimens and should not be used as a sole basis for treatment. Nasal washings and aspirates are unacceptable for Xpert Xpress SARS-CoV-2/FLU/RSV testing.  Fact Sheet for Patients: BloggerCourse.com  Fact Sheet for Healthcare Providers: SeriousBroker.it  This test is not yet approved or cleared by the [place] FDA and has been authorized for detection and/or diagnosis of SARS-CoV-2 by FDA under an Emergency Use Authorization (EUA). This EUA will remain in effect (meaning this test can be used) for the duration of the COVID-19 declaration under Section 564(b)(1) of the Act, 21 U.S.C. section 360bbb-3(b)(1), unless the authorization is terminated or revoked.     Resp Syncytial Virus by PCR NEGATIVE NEGATIVE Final    Comment: (NOTE) Fact Sheet for  Patients: BloggerCourse.com  Fact Sheet for Healthcare Providers: SeriousBroker.it  This test is not yet approved or cleared by the [place] FDA and has been authorized for detection and/or diagnosis of SARS-CoV-2 by FDA under an Emergency Use Authorization (EUA). This EUA will remain in effect (meaning this test can be used) for the duration of the COVID-19 declaration under Section 564(b)(1) of the Act, 21 U.S.C. section 360bbb-3(b)(1), unless the authorization is terminated or revoked.  Performed at [facility], [address] [place], [place] [address]   Blood Culture (routine x 2)     Status: None (Preliminary result)   Collection Time: 10/07/22  4:45 AM   Specimen: BLOOD  Result Value Ref Range  Status   Specimen Description   Final    BLOOD BLOOD RIGHT FOREARM Performed at [facility], [address], [place], [place] [address]    Special Requests   Final    BOTTLES DRAWN AEROBIC AND ANAEROBIC Blood Culture adequate volume Performed at [facility], [address], [place], [place] [address]    Culture   Final    NO GROWTH 2 DAYS Performed at [facility], [address] [place], [place] [address]    Report Status PENDING  Incomplete  MRSA Next Gen by PCR, Nasal     Status: None   Collection Time: 10/07/22  9:23 AM   Specimen: Nasal Mucosa; Nasal Swab  Result Value Ref Range Status   MRSA by PCR Next Gen NOT DETECTED NOT DETECTED Final    Comment: (NOTE) The GeneXpert MRSA Assay (FDA approved for NASAL specimens only), is one component of a comprehensive MRSA colonization surveillance program. It is not intended to diagnose MRSA infection nor to guide or monitor treatment for MRSA infections. Test performance is not FDA approved in patients less than 43 years old. Performed at [facility], [address], [place],  [place] [address]      Labs: BNP (last 3 results) No results for input(s): "BNP" in the last 8760 hours. Basic Metabolic Panel: Recent Labs  Lab 10/07/22 0439 10/08/22 0308 10/08/22 0701 10/09/22 0542  NA 134* 140  --  135  K 3.1* 3.8  --  3.5  CL 96* 110  --  106  CO2 19* 23  --  22  GLUCOSE 189* 92  --  89  BUN 14 9  --  <5*  CREATININE 0.90 0.71  --  0.70  CALCIUM 9.2 8.0*  --  8.0*  MG  --   --  1.8 2.0   Liver Function Tests: Recent Labs  Lab 10/07/22 0439  AST 26  ALT 21  ALKPHOS 97  BILITOT 0.5  PROT 8.1  ALBUMIN 4.5   Recent Labs  Lab 10/07/22 0439  LIPASE 16   No results for input(s): "AMMONIA" in the last 168 hours. CBC: Recent Labs  Lab 10/07/22 0439 10/08/22 0701 10/09/22 0542  WBC 20.8* 11.2* 8.7  NEUTROABS 13.2*  --   --   HGB 15.8 12.0* 11.8*  HCT 45.9 36.1* 36.8*  MCV 84.4 88.3 90.0  PLT 523* 327 290   Cardiac Enzymes: Recent Labs  Lab 10/07/22 0439  CKTOTAL 109   BNP: Invalid input(s): "POCBNP" CBG: No results for input(s): "GLUCAP" in the last 168 hours. D-Dimer No results for input(s): "DDIMER" in the last 72 hours. Hgb A1c No results for input(s): "HGBA1C" in the last 72 hours. Lipid Profile No results for input(s): "CHOL", "HDL", "LDLCALC", "TRIG", "CHOLHDL", "LDLDIRECT" in the last 72 hours. Thyroid function studies No results for input(s): "TSH", "T4TOTAL", "T3FREE", "THYROIDAB" in the last 72 hours.  Invalid input(s): "FREET3" Anemia work up No results for input(s): "VITAMINB12", "FOLATE", "FERRITIN", "TIBC", "IRON", "RETICCTPCT" in the last 72 hours. Urinalysis    Component Value Date/Time   COLORURINE COLORLESS (A) 10/07/2022 0745   APPEARANCEUR CLEAR 10/07/2022 0745   LABSPEC 1.027 10/07/2022 0745   PHURINE 6.5 10/07/2022 0745   GLUCOSEU NEGATIVE 10/07/2022 0745   GLUCOSEU NEGATIVE 03/22/2011 1352   HGBUR NEGATIVE 10/07/2022 0745   BILIRUBINUR NEGATIVE 10/07/2022 0745   KETONESUR NEGATIVE 10/07/2022 0745    PROTEINUR NEGATIVE 10/07/2022 0745   UROBILINOGEN 0.2 03/22/2011 1352  NITRITE NEGATIVE 10/07/2022 0745   LEUKOCYTESUR NEGATIVE 10/07/2022 0745   Sepsis Labs Recent Labs  Lab 10/07/22 0439 10/08/22 0701 10/09/22 0542  WBC 20.8* 11.2* 8.7   Microbiology Recent Results (from the past 240 hour(s))  Blood Culture (routine x 2)     Status: None (Preliminary result)   Collection Time: 10/07/22  4:40 AM   Specimen: BLOOD  Result Value Ref Range Status   Specimen Description   Final    BLOOD BLOOD LEFT FOREARM Performed at [facility], [address] [place], [place] [address]    Special Requests   Final    BOTTLES DRAWN AEROBIC AND ANAEROBIC Blood Culture adequate volume Performed at [facility], [address] [place], [place] [address]    Culture   Final    NO GROWTH 2 DAYS Performed at [facility], [address] [place], [place] [address]    Report Status PENDING  Incomplete  Resp panel by RT-PCR (RSV, Flu A&B, Covid) Anterior Nasal Swab     Status: None   Collection Time: 10/07/22  4:40 AM   Specimen: Anterior Nasal Swab  Result Value Ref Range Status   SARS Coronavirus 2 by RT PCR NEGATIVE NEGATIVE Final    Comment: (NOTE) SARS-CoV-2 target nucleic acids are NOT DETECTED.  The SARS-CoV-2 RNA is generally detectable in upper respiratory specimens during the acute phase of infection. The lowest concentration of SARS-CoV-2 viral copies this assay can detect is 138 copies/mL. A negative result does not preclude SARS-Cov-2 infection and should not be used as the sole basis for treatment or other patient management decisions. A negative result may occur with  improper specimen collection/handling, submission of specimen other than nasopharyngeal swab, presence of viral mutation(s) within the areas targeted by this assay, and inadequate number of viral copies(<138 copies/mL). A negative result must be combined  with clinical observations, patient history, and epidemiological information. The expected result is Negative.  Fact Sheet for Patients:  BloggerCourse.com  Fact Sheet for Healthcare Providers:  SeriousBroker.it  This test is no t yet approved or cleared by the [place] FDA and  has been authorized for detection and/or diagnosis of SARS-CoV-2 by FDA under an Emergency Use Authorization (EUA). This EUA will remain  in effect (meaning this test can be used) for the duration of the COVID-19 declaration under Section 564(b)(1) of the Act, 21 U.S.C.section 360bbb-3(b)(1), unless the authorization is terminated  or revoked sooner.       Influenza A by PCR NEGATIVE NEGATIVE Final   Influenza B by PCR NEGATIVE NEGATIVE Final    Comment: (NOTE) The Xpert Xpress SARS-CoV-2/FLU/RSV plus assay is intended as an aid in the diagnosis of influenza from Nasopharyngeal swab specimens and should not be used as a sole basis for treatment. Nasal washings and aspirates are unacceptable for Xpert Xpress SARS-CoV-2/FLU/RSV testing.  Fact Sheet for Patients: BloggerCourse.com  Fact Sheet for Healthcare Providers: SeriousBroker.it  This test is not yet approved or cleared by the [place] FDA and has been authorized for detection and/or diagnosis of SARS-CoV-2 by FDA under an Emergency Use Authorization (EUA). This EUA will remain in effect (meaning this test can be used) for the duration of the COVID-19 declaration under Section 564(b)(1) of the Act, 21 U.S.C. section 360bbb-3(b)(1), unless the authorization is terminated or revoked.     Resp Syncytial Virus by PCR NEGATIVE NEGATIVE Final    Comment: (NOTE) Fact Sheet for Patients: BloggerCourse.com  Fact Sheet for Healthcare Providers:  SeriousBroker.it  This test is not yet approved  or cleared by the [place] and has been authorized for detection and/or diagnosis of SARS-CoV-2 by FDA under an Emergency Use Authorization (EUA). This EUA will remain in effect (meaning this test can be used) for the duration of the COVID-19 declaration under Section 564(b)(1) of the Act, 21 U.S.C. section 360bbb-3(b)(1), unless the authorization is terminated or revoked.  Performed at [facility], [address], [place], [place] [address]   Blood Culture (routine x 2)     Status: None (Preliminary result)   Collection Time: 10/07/22  4:45 AM   Specimen: BLOOD  Result Value Ref Range Status   Specimen Description   Final    BLOOD BLOOD RIGHT FOREARM Performed at [facility], [address], [place], [place] [address]    Special Requests   Final    BOTTLES DRAWN AEROBIC AND ANAEROBIC Blood Culture adequate volume Performed at [facility], [address], [place], [place] [address]    Culture   Final    NO GROWTH 2 DAYS Performed at [facility], [address] [place], [place] [address]    Report Status PENDING  Incomplete  MRSA Next Gen by PCR, Nasal     Status: None   Collection Time: 10/07/22  9:23 AM   Specimen: Nasal Mucosa; Nasal Swab  Result Value Ref Range Status   MRSA by PCR Next Gen NOT DETECTED NOT DETECTED Final    Comment: (NOTE) The GeneXpert MRSA Assay (FDA approved for NASAL specimens only), is one component of a comprehensive MRSA colonization surveillance program. It is not intended to diagnose MRSA infection nor to guide or monitor treatment for MRSA infections. Test performance is not FDA approved in patients less than 7 years old. Performed at [facility], [address], [place], [place] [address]      Time coordinating discharge: Over 30 minutes  SIGNED:   Alvira Philips [place], DO  [facility] 10/09/2022, 10:16 AM

## 2022-10-11 ENCOUNTER — Telehealth: Payer: Self-pay

## 2022-10-11 NOTE — Transitions of Care (Post Inpatient/ED Visit) (Signed)
   10/11/2022  Name: Erik Shaffer MRN: [id number] DOB: 06-Aug-1965  Today's TOC FU Call Status: Today's TOC FU Call Status:: Unsuccessul Call (1st Attempt) Unsuccessful Call (1st Attempt) Date: 10/11/22  Attempted to reach the patient regarding the most recent Inpatient/ED visit.  Follow Up Plan: Additional outreach attempts will be made to reach the patient to complete the Transitions of Care (Post Inpatient/ED visit) call.   Jodelle Gross, RN, BSN, CCM Care Management Coordinator [place]

## 2022-10-12 ENCOUNTER — Telehealth: Payer: Self-pay

## 2022-10-12 LAB — CULTURE, BLOOD (ROUTINE X 2)
Culture: NO GROWTH
Special Requests: ADEQUATE
Special Requests: ADEQUATE

## 2022-10-12 NOTE — Transitions of Care (Post Inpatient/ED Visit) (Signed)
   10/12/2022  Name: Erik Shaffer MRN: [id number] DOB: [birth year]  Today's TOC FU Call Status: Today's TOC FU Call Status:: Unsuccessful Call (2nd Attempt) Unsuccessful Call (2nd Attempt) Date: 10/12/22  Attempted to reach the patient regarding the most recent Inpatient/ED visit.  Follow Up Plan: Additional outreach attempts will be made to reach the patient to complete the Transitions of Care (Post Inpatient/ED visit) call.   Jodelle Gross, RN, BSN, CCM Care Management Coordinator [place]

## 2022-10-13 ENCOUNTER — Telehealth: Payer: Self-pay

## 2022-10-13 DIAGNOSIS — M62838 Other muscle spasm: Secondary | ICD-10-CM | POA: Diagnosis not present

## 2022-10-13 DIAGNOSIS — M545 Low back pain, unspecified: Secondary | ICD-10-CM | POA: Diagnosis not present

## 2022-10-13 DIAGNOSIS — G894 Chronic pain syndrome: Secondary | ICD-10-CM | POA: Diagnosis not present

## 2022-10-13 DIAGNOSIS — Z79899 Other long term (current) drug therapy: Secondary | ICD-10-CM | POA: Diagnosis not present

## 2022-10-13 NOTE — Transitions of Care (Post Inpatient/ED Visit) (Signed)
   10/13/2022  Name: Erik Shaffer MRN: [id number] DOB: [birth year]  Today's TOC FU Call Status: Today's TOC FU Call Status:: Unsuccessful Call (3rd Attempt) Unsuccessful Call (3rd Attempt) Date: 10/13/22  Attempted to reach the patient regarding the most recent Inpatient/ED visit.  Follow Up Plan: No further outreach attempts will be made at this time. We have been unable to contact the patient.  Jodelle Gross, RN, BSN, CCM Care Management Coordinator [place] Phone: [phone]/Fax: [phone]

## 2022-10-14 ENCOUNTER — Encounter: Payer: Self-pay | Admitting: Family Medicine

## 2022-10-14 ENCOUNTER — Ambulatory Visit (INDEPENDENT_AMBULATORY_CARE_PROVIDER_SITE_OTHER): Payer: PPO | Admitting: Family Medicine

## 2022-10-14 VITALS — BP 125/75 | HR 89 | Temp 97.8°F | Ht 73.0 in | Wt 187.6 lb

## 2022-10-14 DIAGNOSIS — G894 Chronic pain syndrome: Secondary | ICD-10-CM

## 2022-10-14 DIAGNOSIS — Z09 Encounter for follow-up examination after completed treatment for conditions other than malignant neoplasm: Secondary | ICD-10-CM | POA: Diagnosis not present

## 2022-10-14 DIAGNOSIS — G2581 Restless legs syndrome: Secondary | ICD-10-CM

## 2022-10-14 DIAGNOSIS — F10931 Alcohol use, unspecified with withdrawal delirium: Secondary | ICD-10-CM

## 2022-10-14 DIAGNOSIS — F5101 Primary insomnia: Secondary | ICD-10-CM | POA: Diagnosis not present

## 2022-10-14 DIAGNOSIS — M0579 Rheumatoid arthritis with rheumatoid factor of multiple sites without organ or systems involvement: Secondary | ICD-10-CM

## 2022-10-14 DIAGNOSIS — R911 Solitary pulmonary nodule: Secondary | ICD-10-CM | POA: Diagnosis not present

## 2022-10-14 DIAGNOSIS — E876 Hypokalemia: Secondary | ICD-10-CM | POA: Diagnosis not present

## 2022-10-14 DIAGNOSIS — D508 Other iron deficiency anemias: Secondary | ICD-10-CM

## 2022-10-14 DIAGNOSIS — E559 Vitamin D deficiency, unspecified: Secondary | ICD-10-CM | POA: Diagnosis not present

## 2022-10-14 MED ORDER — TIZANIDINE HCL 2 MG PO TABS
2.0000 mg | ORAL_TABLET | Freq: Two times a day (BID) | ORAL | 0 refills | Status: AC | PRN
Start: 2022-10-14 — End: ?

## 2022-10-14 MED ORDER — TRAZODONE HCL 100 MG PO TABS
100.0000 mg | ORAL_TABLET | Freq: Every evening | ORAL | 1 refills | Status: DC | PRN
Start: 2022-10-14 — End: 2023-03-15

## 2022-10-14 MED ORDER — NAPROXEN 500 MG PO TABS
500.0000 mg | ORAL_TABLET | Freq: Two times a day (BID) | ORAL | 2 refills | Status: DC
Start: 2022-10-14 — End: 2024-01-02

## 2022-10-14 NOTE — Progress Notes (Signed)
Subjective:  Patient ID: Erik Shaffer, male    DOB: 1965/10/01, 57 y.o.   MRN: [id number]  Patient Care Team: Sonny Masters, FNP as PCP - General (Family Medicine) Ranee Gosselin, MD (Orthopedic Surgery)   Chief Complaint:  Hospitalization Follow-up (10/07/2022 - 10/09/2022 (2 days)/[facility]- Alcohol withdrawal)   HPI: Erik Shaffer is a 57 y.o. male presenting on 10/14/2022 for Hospitalization Follow-up (10/07/2022 - 10/09/2022 (2 days)/[facility]- Alcohol withdrawal)  Pt presents today for hospital discharge follow up. He was admitted to [facility] from 10/07/2022-10/09/2022 for alcohol withdrawal with delirium, SIRS, hypokalemia, pulmonary nodule, lactic acidosis secondary to dehydration, leukocytosis, RA, and tobacco use disorder.  [place] States he started having increased pain so he started drinking to help with pain relief. Labs revealed ETOH 33, hypokalemia, leukocytosis, elevated lactic acid, and low Hgb and Hct. Labs had improved at time of discharge. He did have withdrawal symptoms and was treated with a librium taper. He did not fill this once he was discharged. He did not fill the gabapentin. He has since followed up with pain management and was restarted on his tramadol. States he has been doing well since discharge.   Relevant past medical, surgical, family, and social history reviewed and updated as indicated.  Allergies and medications reviewed and updated. Data reviewed: Chart in Epic.   Past Medical History:  Diagnosis Date   Arthritis    Depression    Ulcer     Past Surgical History:  Procedure Laterality Date   HEMILAMINOTOMY LUMBAR SPINE  06/24/2010   MICRODISCECTOMY LUMBAR     L5-S1   SPINE SURGERY  02/2011   Dr. Marikay Alar     Social History   Socioeconomic History   Marital status: Divorced    Spouse name: Not on file   Number of children: 0   Years of education: Not on file    Highest education level: Not on file  Occupational History   Occupation: not working  Tobacco Use   Smoking status: Every Day    Packs/day: 1.00    Years: 30.00    Additional pack years: 0.00    Total pack years: 30.00    Types: Cigarettes   Smokeless tobacco: Never  Vaping Use   Vaping Use: Never used  Substance and Sexual Activity   Alcohol use: No    Comment: nothing in 20 plus years   Drug use: No   Sexual activity: Yes  Other Topics Concern   Not on file  Social History Narrative   Caffienated drinks-yes   Seat belt use often-yes   Regular Exercise-no   Smoke alarm in the home-yes   Firearms/guns in the home-yes   History of physical abuse-no   HLE-12th grade   Lives with sister and her husband in a one story home.  Has no children.  Not married. Currently trying to get disability.            Social Determinants of Health   Financial Resource Strain: Low Risk  (03/23/2021)   Overall Financial Resource Strain (CARDIA)    Difficulty of Paying Living Expenses: Not hard at all  Food Insecurity: No Food Insecurity (10/07/2022)   Hunger Vital Sign    Worried About Running Out of Food in the Last Year: Never true    Ran Out of Food in the Last Year: Never true  Transportation Needs: No Transportation Needs (10/07/2022)  PRAPARE - Administrator, Civil Service (Medical): No    Lack of Transportation (Non-Medical): No  Physical Activity: Insufficiently Active (03/23/2021)   Exercise Vital Sign    Days of Exercise per Week: 3 days    Minutes of Exercise per Session: 30 min  Stress: No Stress Concern Present (03/23/2021)   [employer] of Occupational Health - Occupational Stress Questionnaire    Feeling of Stress : Not at all  Social Connections: Socially Isolated (03/23/2021)   Social Connection and Isolation Panel [NHANES]    Frequency of Communication with Friends and Family: More than three times a week    Frequency of Social Gatherings with Friends  and Family: More than three times a week    Attends Religious Services: Never    Database administrator or Organizations: No    Attends Banker Meetings: Never    Marital Status: Divorced  Catering manager Violence: Not At Risk (10/07/2022)   Humiliation, Afraid, Rape, and Kick questionnaire    Fear of Current or Ex-Partner: No    Emotionally Abused: No    Physically Abused: No    Sexually Abused: No    Outpatient Encounter Medications as of 10/14/2022  Medication Sig   ORAJEL 2X TOOTHACHE & GUM 20-0.26 % GEL Place 1 application  onto teeth See admin instructions. Apply to affected tooth three times a day as needed for pain   traMADol (ULTRAM) 50 MG tablet Take 1-2 tablets (50-100 mg total) by mouth See admin instructions. Take 100 mg by mouth in the morning, 100 mg late afternoon, and 50 mg at bedtime   triamcinolone cream (KENALOG) 0.1 % Apply 1 application. topically 2 (two) times daily. (Patient taking differently: Apply 1 application  topically 2 (two) times daily as needed (for itching).)   TYLENOL 500 MG tablet Take 500 mg by mouth every 6 (six) hours as needed for mild pain or headache.   Vitamin D, Ergocalciferol, (DRISDOL) 1.25 MG (50000 UNIT) CAPS capsule Take 1 capsule (50,000 Units total) by mouth every 7 (seven) days.   [DISCONTINUED] naproxen (NAPROSYN) 500 MG tablet Take 500 mg by mouth 2 (two) times daily with a meal.   [DISCONTINUED] tiZANidine (ZANAFLEX) 2 MG tablet Take 1 tablet (2 mg total) by mouth 2 (two) times daily. (Patient taking differently: Take 2 mg by mouth 2 (two) times daily as needed for muscle spasms.)   [DISCONTINUED] traZODone (DESYREL) 100 MG tablet Take 2 tablets (200 mg total) by mouth at bedtime as needed for sleep. (Patient taking differently: Take 100 mg by mouth at bedtime as needed for sleep.)   naproxen (NAPROSYN) 500 MG tablet Take 1 tablet (500 mg total) by mouth 2 (two) times daily with a meal.   tiZANidine (ZANAFLEX) 2 MG tablet  Take 1 tablet (2 mg total) by mouth 2 (two) times daily as needed for muscle spasms.   traZODone (DESYREL) 100 MG tablet Take 1 tablet (100 mg total) by mouth at bedtime as needed for sleep.   [DISCONTINUED] gabapentin (NEURONTIN) 100 MG capsule Take 1 capsule (100 mg total) by mouth 3 (three) times daily for 14 days. (Patient not taking: Reported on 10/14/2022)   No facility-administered encounter medications on file as of 10/14/2022.    Allergies  Allergen Reactions   Butrans [Buprenorphine] Other (See Comments)    Fatigue    Eucrisa [Crisaborole] Rash and Other (See Comments)    Made the skin red and broken out    Review of  Systems  Constitutional:  Negative for activity change, appetite change, chills, diaphoresis, fatigue, fever and unexpected weight change.  HENT: Negative.    Eyes: Negative.  Negative for photophobia and visual disturbance.  Respiratory:  Negative for cough, chest tightness and shortness of breath.   Cardiovascular:  Negative for chest pain, palpitations and leg swelling.  Gastrointestinal:  Negative for blood in stool, constipation, diarrhea, nausea and vomiting.  Endocrine: Negative.   Genitourinary:  Negative for decreased urine volume, difficulty urinating, dysuria, frequency and urgency.  Musculoskeletal:  Positive for arthralgias, back pain and myalgias. Negative for gait problem, joint swelling, neck pain and neck stiffness.  Skin: Negative.   Allergic/Immunologic: Negative.   Neurological:  Negative for dizziness, tremors, seizures, syncope, facial asymmetry, speech difficulty, weakness, light-headedness, numbness and headaches.  Hematological: Negative.   Psychiatric/Behavioral:  Negative for confusion, hallucinations, sleep disturbance and suicidal ideas.   All other systems reviewed and are negative.       Objective:  BP 125/75   Pulse 89   Temp 97.8 F (36.6 C) (Temporal)   Ht 6\' 1"  (1.854 m)   Wt 187 lb 9.6 oz (85.1 kg)   SpO2 97%   BMI  24.75 kg/m    Wt Readings from Last 3 Encounters:  10/14/22 187 lb 9.6 oz (85.1 kg)  10/07/22 184 lb 4.9 oz (83.6 kg)  03/19/22 186 lb (84.4 kg)    Physical Exam Vitals and nursing note reviewed.  Constitutional:      General: He is not in acute distress.    Appearance: Normal appearance. He is well-developed and well-groomed. He is not ill-appearing, toxic-appearing or diaphoretic.  HENT:     Head: Normocephalic and atraumatic.     Jaw: There is normal jaw occlusion.     Right Ear: Hearing normal.     Left Ear: Hearing normal.     Nose: Nose normal.     Mouth/Throat:     Lips: Pink.     Mouth: Mucous membranes are moist.     Pharynx: Oropharynx is clear. Uvula midline.  Eyes:     General: Lids are normal.     Extraocular Movements: Extraocular movements intact.     Conjunctiva/sclera: Conjunctivae normal.     Pupils: Pupils are equal, round, and reactive to light.  Neck:     Thyroid: No thyroid mass, thyromegaly or thyroid tenderness.     Vascular: No carotid bruit or JVD.     Trachea: Trachea and phonation normal.  Cardiovascular:     Rate and Rhythm: Normal rate and regular rhythm.     Chest Wall: PMI is not displaced.     Pulses: Normal pulses.     Heart sounds: Normal heart sounds. No murmur heard.    No friction rub. No gallop.  Pulmonary:     Effort: Pulmonary effort is normal. No respiratory distress.     Breath sounds: Normal breath sounds. No wheezing.  Abdominal:     General: Bowel sounds are normal. There is no distension or abdominal bruit.     Palpations: Abdomen is soft. There is no hepatomegaly or splenomegaly.     Tenderness: There is no abdominal tenderness. There is no right CVA tenderness or left CVA tenderness.     Hernia: No hernia is present.  Musculoskeletal:     Cervical back: Normal range of motion and neck supple.     Right lower leg: No edema.     Left lower leg: No edema.  Lymphadenopathy:  Cervical: No cervical adenopathy.  Skin:     General: Skin is warm and dry.     Capillary Refill: Capillary refill takes less than 2 seconds.     Coloration: Skin is not cyanotic, jaundiced or pale.     Findings: No rash.  Neurological:     General: No focal deficit present.     Mental Status: He is alert and oriented to person, place, and time.     Sensory: Sensation is intact.     Motor: Motor function is intact.     Coordination: Coordination is intact.     Gait: Gait abnormal (antalgic).     Deep Tendon Reflexes: Reflexes are normal and symmetric.  Psychiatric:        Attention and Perception: Attention and perception normal.        Mood and Affect: Mood and affect normal.        Speech: Speech normal.        Behavior: Behavior normal. Behavior is cooperative.        Thought Content: Thought content normal.        Cognition and Memory: Cognition and memory normal.        Judgment: Judgment normal.     Results for orders placed or performed during the hospital encounter of 10/07/22  Blood Culture (routine x 2)   Specimen: BLOOD  Result Value Ref Range   Specimen Description      BLOOD BLOOD LEFT FOREARM Performed at [facility], [address] [place], [place] [address]    Special Requests      BOTTLES DRAWN AEROBIC AND ANAEROBIC Blood Culture adequate volume Performed at [facility], [address] [place], [place] [address]    Culture      NO GROWTH 5 DAYS Performed at [facility], [address], [place], [place] [address]    Report Status 10/12/2022 FINAL   Blood Culture (routine x 2)   Specimen: BLOOD  Result Value Ref Range   Specimen Description      BLOOD BLOOD RIGHT FOREARM Performed at [facility], [address], [place], [place] [address]    Special Requests      BOTTLES DRAWN AEROBIC AND ANAEROBIC Blood Culture adequate volume Performed at [facility], [address], [place], [place] [address]     Culture      NO GROWTH 5 DAYS Performed at [facility], [address], [place], [place] [address]    Report Status 10/12/2022 FINAL   Resp panel by RT-PCR (RSV, Flu A&B, Covid) Anterior Nasal Swab   Specimen: Anterior Nasal Swab  Result Value Ref Range   SARS Coronavirus 2 by RT PCR NEGATIVE NEGATIVE   Influenza A by PCR NEGATIVE NEGATIVE   Influenza B by PCR NEGATIVE NEGATIVE   Resp Syncytial Virus by PCR NEGATIVE NEGATIVE  MRSA Next Gen by PCR, Nasal   Specimen: Nasal Mucosa; Nasal Swab  Result Value Ref Range   MRSA by PCR Next Gen NOT DETECTED NOT DETECTED  Lactic acid, plasma  Result Value Ref Range   Lactic Acid, Venous 7.0 (HH) 0.5 - 1.9 mmol/L  Lactic acid, plasma  Result Value Ref Range   Lactic Acid, Venous 3.8 (HH) 0.5 - 1.9 mmol/L  Comprehensive metabolic panel  Result Value Ref Range   Sodium 134 (L) 135 - 145 mmol/L   Potassium 3.1 (L) 3.5 - 5.1 mmol/L   Chloride 96 (L) 98 - 111 mmol/L  CO2 19 (L) 22 - 32 mmol/L   Glucose, Bld 189 (H) 70 - 99 mg/dL   BUN 14 6 - 20 mg/dL   Creatinine, Ser 9.56 0.61 - 1.24 mg/dL   Calcium 9.2 8.9 - 21.3 mg/dL   Total Protein 8.1 6.5 - 8.1 g/dL   Albumin 4.5 3.5 - 5.0 g/dL   AST 26 15 - 41 U/L   ALT 21 0 - 44 U/L   Alkaline Phosphatase 97 38 - 126 U/L   Total Bilirubin 0.5 0.3 - 1.2 mg/dL   GFR, Estimated >08 >65 mL/min   Anion gap 19 (H) 5 - 15  CBC with Differential  Result Value Ref Range   WBC 20.8 (H) 4.0 - 10.5 K/uL   RBC 5.44 4.22 - 5.81 MIL/uL   Hemoglobin 15.8 13.0 - 17.0 g/dL   HCT 78.4 69.6 - 29.5 %   MCV 84.4 80.0 - 100.0 fL   MCH 29.0 26.0 - 34.0 pg   MCHC 34.4 30.0 - 36.0 g/dL   RDW 28.4 13.2 - 44.0 %   Platelets 523 (H) 150 - 400 K/uL   nRBC 0.0 0.0 - 0.2 %   Neutrophils Relative % 64 %   Neutro Abs 13.2 (H) 1.7 - 7.7 K/uL   Lymphocytes Relative 23 %   Lymphs Abs 4.8 (H) 0.7 - 4.0 K/uL   Monocytes Relative 13 %   Monocytes Absolute 2.6 (H) 0.1 - 1.0 K/uL   Eosinophils Relative 0 %    Eosinophils Absolute 0.0 0.0 - 0.5 K/uL   Basophils Relative 0 %   Basophils Absolute 0.1 0.0 - 0.1 K/uL   Immature Granulocytes 0 %   Abs Immature Granulocytes 0.08 (H) 0.00 - 0.07 K/uL  Protime-INR  Result Value Ref Range   Prothrombin Time 14.7 11.4 - 15.2 seconds   INR 1.1 0.8 - 1.2  APTT  Result Value Ref Range   aPTT 27 24 - 36 seconds  Lipase, blood  Result Value Ref Range   Lipase 16 11 - 51 U/L  Urinalysis, w/ Reflex to Culture (Infection Suspected) -Urine, Clean Catch  Result Value Ref Range   Specimen Source URINE, CLEAN CATCH    Color, Urine COLORLESS (A) YELLOW   APPearance CLEAR CLEAR   Specific Gravity, Urine 1.027 1.005 - 1.030   pH 6.5 5.0 - 8.0   Glucose, UA NEGATIVE NEGATIVE mg/dL   Hgb urine dipstick NEGATIVE NEGATIVE   Bilirubin Urine NEGATIVE NEGATIVE   Ketones, ur NEGATIVE NEGATIVE mg/dL   Protein, ur NEGATIVE NEGATIVE mg/dL   Nitrite NEGATIVE NEGATIVE   Leukocytes,Ua NEGATIVE NEGATIVE   RBC / HPF 0-5 0 - 5 RBC/hpf   WBC, UA 0-5 0 - 5 WBC/hpf   Bacteria, UA NONE SEEN NONE SEEN   Squamous Epithelial / HPF 0-5 0 - 5 /HPF  Ethanol  Result Value Ref Range   Alcohol, Ethyl (B) 33 (H) <10 mg/dL  Rapid urine drug screen (hospital performed)  Result Value Ref Range   Opiates 0 (A) NONE DETECTED   Cocaine 0 (A) NONE DETECTED   Benzodiazepines 0 (A) NONE DETECTED   Amphetamines 0 (A) NONE DETECTED   Tetrahydrocannabinol 0 (A) NONE DETECTED   Barbiturates 0 (A) NONE DETECTED  CK  Result Value Ref Range   Total CK 109 49 - 397 U/L  Salicylate level  Result Value Ref Range   Salicylate Lvl <7.0 (L) 7.0 - 30.0 mg/dL  Acetaminophen level  Result Value Ref Range   Acetaminophen (  Tylenol), Serum <10 (L) 10 - 30 ug/mL  Lactic acid, plasma  Result Value Ref Range   Lactic Acid, Venous 1.7 0.5 - 1.9 mmol/L  Lactic acid, plasma  Result Value Ref Range   Lactic Acid, Venous 2.1 (HH) 0.5 - 1.9 mmol/L  HIV Antibody (routine testing w rflx)  Result Value Ref  Range   HIV Screen 4th Generation wRfx Non Reactive Non Reactive  Basic metabolic panel  Result Value Ref Range   Sodium 140 135 - 145 mmol/L   Potassium 3.8 3.5 - 5.1 mmol/L   Chloride 110 98 - 111 mmol/L   CO2 23 22 - 32 mmol/L   Glucose, Bld 92 70 - 99 mg/dL   BUN 9 6 - 20 mg/dL   Creatinine, Ser 1.61 0.61 - 1.24 mg/dL   Calcium 8.0 (L) 8.9 - 10.3 mg/dL   GFR, Estimated >09 >60 mL/min   Anion gap 7 5 - 15  CBC  Result Value Ref Range   WBC 11.2 (H) 4.0 - 10.5 K/uL   RBC 4.09 (L) 4.22 - 5.81 MIL/uL   Hemoglobin 12.0 (L) 13.0 - 17.0 g/dL   HCT 45.4 (L) 09.8 - 11.9 %   MCV 88.3 80.0 - 100.0 fL   MCH 29.3 26.0 - 34.0 pg   MCHC 33.2 30.0 - 36.0 g/dL   RDW 14.7 82.9 - 56.2 %   Platelets 327 150 - 400 K/uL   nRBC 0.0 0.0 - 0.2 %  Procalcitonin  Result Value Ref Range   Procalcitonin <0.10 ng/mL  Magnesium  Result Value Ref Range   Magnesium 1.8 1.7 - 2.4 mg/dL  Lactic acid, plasma  Result Value Ref Range   Lactic Acid, Venous 0.7 0.5 - 1.9 mmol/L  Basic metabolic panel  Result Value Ref Range   Sodium 135 135 - 145 mmol/L   Potassium 3.5 3.5 - 5.1 mmol/L   Chloride 106 98 - 111 mmol/L   CO2 22 22 - 32 mmol/L   Glucose, Bld 89 70 - 99 mg/dL   BUN <5 (L) 6 - 20 mg/dL   Creatinine, Ser 1.30 0.61 - 1.24 mg/dL   Calcium 8.0 (L) 8.9 - 10.3 mg/dL   GFR, Estimated >86 >57 mL/min   Anion gap 7 5 - 15  CBC  Result Value Ref Range   WBC 8.7 4.0 - 10.5 K/uL   RBC 4.09 (L) 4.22 - 5.81 MIL/uL   Hemoglobin 11.8 (L) 13.0 - 17.0 g/dL   HCT 84.6 (L) 96.2 - 95.2 %   MCV 90.0 80.0 - 100.0 fL   MCH 28.9 26.0 - 34.0 pg   MCHC 32.1 30.0 - 36.0 g/dL   RDW 84.1 32.4 - 40.1 %   Platelets 290 150 - 400 K/uL   nRBC 0.0 0.0 - 0.2 %  Magnesium  Result Value Ref Range   Magnesium 2.0 1.7 - 2.4 mg/dL       Pertinent labs & imaging results that were available during my care of the patient were reviewed by me and considered in my medical decision making.  Assessment & Plan:  Treyon was  seen today for hospitalization follow-up.  Diagnoses and all orders for this visit:  Hospital discharge follow-up Today's visit was for Transitional Care Management.  The patient was discharged from [facility] on 10/09/2022 with a primary diagnosis of alcohol withdrawal.   Attempted contact with the patient and/or caregiver, by a clinical staff member, was made on 10/14/2022, 10/12/2022, 10/07/2022 and was documented as  a telephone encounter within the EMR.  Through chart review and discussion with the patient I have determined that management of their condition is of high complexity.  -     Anemia Profile B -     CMP14+EGFR -     Folate -     Vitamin B1 -     Thyroid Panel With TSH -     VITAMIN D 25 Hydroxy (Vit-D Deficiency, Fractures)  Alcohol withdrawal syndrome, with delirium (HCC) Has not been drinking since discharge, continued cessation emphasized.  -     Anemia Profile B -     CMP14+EGFR -     Folate -     Vitamin B1 -     Thyroid Panel With TSH -     VITAMIN D 25 Hydroxy (Vit-D Deficiency, Fractures)  Hypokalemia Will repeat labs.  -     CMP14+EGFR  Pulmonary nodule Declines referral to pulmonology at this time. Declined additional imaging.   Rheumatoid arthritis involving multiple sites with positive rheumatoid factor (HCC) Takes below as needed, will refill.  -     naproxen (NAPROSYN) 500 MG tablet; Take 1 tablet (500 mg total) by mouth 2 (two) times daily with a meal.  Primary insomnia Doing well on below, will continue.  -     traZODone (DESYREL) 100 MG tablet; Take 1 tablet (100 mg total) by mouth at bedtime as needed for sleep.  Restless leg Chronic pain disorder Does well on below, will continue.  -     tiZANidine (ZANAFLEX) 2 MG tablet; Take 1 tablet (2 mg total) by mouth 2 (two) times daily as needed for muscle spasms. -     Anemia Profile B  Vitamin D deficiency Will repeat labs today -     CMP14+EGFR -     VITAMIN D 25 Hydroxy (Vit-D  Deficiency, Fractures)     Continue all other maintenance medications.  Follow up plan: Return if symptoms worsen or fail to improve.   Continue healthy lifestyle choices, including diet (rich in fruits, vegetables, and lean proteins, and low in salt and simple carbohydrates) and exercise (at least 30 minutes of moderate physical activity daily).    The above assessment and management plan was discussed with the patient. The patient verbalized understanding of and has agreed to the management plan. Patient is aware to call the clinic if they develop any new symptoms or if symptoms persist or worsen. Patient is aware when to return to the clinic for a follow-up visit. Patient educated on when it is appropriate to go to the emergency department.   Kari Baars, FNP-C [facility] [phone]

## 2022-10-15 DIAGNOSIS — Z79899 Other long term (current) drug therapy: Secondary | ICD-10-CM | POA: Diagnosis not present

## 2022-10-15 LAB — THYROID PANEL WITH TSH: T4, Total: 8.6 ug/dL (ref 4.5–12.0)

## 2022-10-15 LAB — ANEMIA PROFILE B
EOS (ABSOLUTE): 0.4 10*3/uL (ref 0.0–0.4)
Folate: 3 ng/mL — ABNORMAL LOW (ref >3.0)
Hematocrit: 37.2 % — ABNORMAL LOW (ref 37.5–51.0)
Iron: 15 ug/dL — ABNORMAL LOW (ref 38–169)
Monocytes Absolute: 1.6 10*3/uL — ABNORMAL HIGH (ref 0.1–0.9)
WBC: 8.4 10*3/uL (ref 3.4–10.8)

## 2022-10-15 LAB — CMP14+EGFR
BUN: 6 mg/dL (ref 6–24)
Bilirubin Total: <0.2 mg/dL (ref 0.0–1.2)
Calcium: 8.8 mg/dL (ref 8.7–10.2)
Chloride: 96 mmol/L (ref 96–106)
Creatinine, Ser: 0.77 mg/dL (ref 0.76–1.27)
Globulin, Total: 2.7 g/dL (ref 1.5–4.5)
Sodium: 136 mmol/L (ref 134–144)

## 2022-10-15 MED ORDER — IRON (FERROUS SULFATE) 325 (65 FE) MG PO TABS
325.0000 mg | ORAL_TABLET | Freq: Every day | ORAL | 6 refills | Status: DC
Start: 2022-10-15 — End: 2023-04-15

## 2022-10-15 NOTE — Addendum Note (Signed)
Addended by: Sonny Masters on: 10/15/2022 07:49 AM   Modules accepted: Orders

## 2022-10-18 LAB — ANEMIA PROFILE B
Basophils Absolute: 0.1 10*3/uL (ref 0.0–0.2)
Basos: 1 %
Eos: 4 %
Ferritin: 134 ng/mL (ref 30–400)
Hemoglobin: 12.2 g/dL — ABNORMAL LOW (ref 13.0–17.7)
Immature Grans (Abs): 0 10*3/uL (ref 0.0–0.1)
Immature Granulocytes: 1 %
Iron Saturation: 6 % — CL (ref 15–55)
Lymphocytes Absolute: 2.5 10*3/uL (ref 0.7–3.1)
Lymphs: 30 %
MCH: 28.5 pg (ref 26.6–33.0)
MCHC: 32.8 g/dL (ref 31.5–35.7)
MCV: 87 fL (ref 79–97)
Monocytes: 19 %
Neutrophils Absolute: 3.8 10*3/uL (ref 1.4–7.0)
Neutrophils: 45 %
Platelets: 440 10*3/uL (ref 150–450)
RBC: 4.28 x10E6/uL (ref 4.14–5.80)
RDW: 13.4 % (ref 11.6–15.4)
Retic Ct Pct: 2.3 % (ref 0.6–2.6)
Total Iron Binding Capacity: 244 ug/dL — ABNORMAL LOW (ref 250–450)
UIBC: 229 ug/dL (ref 111–343)
Vitamin B-12: 555 pg/mL (ref 232–1245)

## 2022-10-18 LAB — THYROID PANEL WITH TSH
Free Thyroxine Index: 2.3 (ref 1.2–4.9)
T3 Uptake Ratio: 27 % (ref 24–39)
TSH: 2.17 u[IU]/mL (ref 0.450–4.500)

## 2022-10-18 LAB — CMP14+EGFR
ALT: 31 IU/L (ref 0–44)
AST: 33 IU/L (ref 0–40)
Albumin: 3.8 g/dL (ref 3.8–4.9)
Alkaline Phosphatase: 106 IU/L (ref 44–121)
BUN/Creatinine Ratio: 8 — ABNORMAL LOW (ref 9–20)
CO2: 27 mmol/L (ref 20–29)
Glucose: 92 mg/dL (ref 70–99)
Potassium: 4.7 mmol/L (ref 3.5–5.2)
Total Protein: 6.5 g/dL (ref 6.0–8.5)
eGFR: 105 mL/min/{1.73_m2} (ref >59)

## 2022-10-18 LAB — VITAMIN D 25 HYDROXY (VIT D DEFICIENCY, FRACTURES): Vit D, 25-Hydroxy: 23.9 ng/mL — ABNORMAL LOW (ref 30.0–100.0)

## 2022-10-18 LAB — VITAMIN B1: Thiamine: 126.9 nmol/L (ref 66.5–200.0)

## 2022-11-11 DIAGNOSIS — Z79899 Other long term (current) drug therapy: Secondary | ICD-10-CM | POA: Diagnosis not present

## 2022-11-11 DIAGNOSIS — M62838 Other muscle spasm: Secondary | ICD-10-CM | POA: Diagnosis not present

## 2022-11-11 DIAGNOSIS — G894 Chronic pain syndrome: Secondary | ICD-10-CM | POA: Diagnosis not present

## 2022-11-11 DIAGNOSIS — M545 Low back pain, unspecified: Secondary | ICD-10-CM | POA: Diagnosis not present

## 2022-11-15 DIAGNOSIS — Z79899 Other long term (current) drug therapy: Secondary | ICD-10-CM | POA: Diagnosis not present

## 2023-01-13 DIAGNOSIS — M545 Low back pain, unspecified: Secondary | ICD-10-CM | POA: Diagnosis not present

## 2023-01-13 DIAGNOSIS — M62838 Other muscle spasm: Secondary | ICD-10-CM | POA: Diagnosis not present

## 2023-01-13 DIAGNOSIS — Z79899 Other long term (current) drug therapy: Secondary | ICD-10-CM | POA: Diagnosis not present

## 2023-01-13 DIAGNOSIS — G894 Chronic pain syndrome: Secondary | ICD-10-CM | POA: Diagnosis not present

## 2023-01-13 DIAGNOSIS — M069 Rheumatoid arthritis, unspecified: Secondary | ICD-10-CM | POA: Diagnosis not present

## 2023-01-17 DIAGNOSIS — Z79899 Other long term (current) drug therapy: Secondary | ICD-10-CM | POA: Diagnosis not present

## 2023-03-14 DIAGNOSIS — M069 Rheumatoid arthritis, unspecified: Secondary | ICD-10-CM | POA: Diagnosis not present

## 2023-03-14 DIAGNOSIS — Z79899 Other long term (current) drug therapy: Secondary | ICD-10-CM | POA: Diagnosis not present

## 2023-03-14 DIAGNOSIS — M62838 Other muscle spasm: Secondary | ICD-10-CM | POA: Diagnosis not present

## 2023-03-14 DIAGNOSIS — M545 Low back pain, unspecified: Secondary | ICD-10-CM | POA: Diagnosis not present

## 2023-03-15 ENCOUNTER — Other Ambulatory Visit: Payer: Self-pay | Admitting: Family Medicine

## 2023-03-15 DIAGNOSIS — F5101 Primary insomnia: Secondary | ICD-10-CM

## 2023-03-15 MED ORDER — TRAZODONE HCL 100 MG PO TABS
100.0000 mg | ORAL_TABLET | Freq: Every evening | ORAL | 1 refills | Status: DC | PRN
Start: 2023-03-15 — End: 2023-03-22

## 2023-03-15 NOTE — Telephone Encounter (Signed)
Copied from CRM [id number]. Topic: Clinical - Medication Refill >> Mar 15, 2023 11:02 AM Donita Brooks wrote: Most Recent Primary Care Visit:  Provider: Sonny Masters  Department: [facility]  Visit Type: HOSPITAL FU  Date: 10/14/2022  Medication: TraZODone (DESYREL) 100mg  PT STATED HE TAKES 1 AND HALF A NIGHT   Has the patient contacted their pharmacy? Yes (Agent: If no, request that the patient contact the pharmacy for the refill. If patient does not wish to contact the pharmacy document the reason why and proceed with request.) (Agent: If yes, when and what did the pharmacy advise?)  Is this the correct pharmacy for this prescription? Yes If no, delete pharmacy and type the correct one.  This is the patient's preferred pharmacy:  [id number] [place], [place] - [address] [place] [address] Phone: [phone] Fax: [phone]   Has the prescription been filled recently? No  Is the patient out of the medication? Yes  Has the patient been seen for an appointment in the last year OR does the patient have an upcoming appointment? Yes  Can we respond through MyChart? Yes  Agent: Please be advised that Rx refills may take up to 3 business days. We ask that you follow-up with your pharmacy.

## 2023-03-16 DIAGNOSIS — Z79899 Other long term (current) drug therapy: Secondary | ICD-10-CM | POA: Diagnosis not present

## 2023-03-21 ENCOUNTER — Telehealth: Payer: Self-pay | Admitting: *Deleted

## 2023-03-21 NOTE — Telephone Encounter (Signed)
Copied from CRM [id number]. Topic: Clinical - Prescription Issue >> Mar 21, 2023 11:15 AM Erik Shaffer B wrote: Reason for CRM:  traZODone (DESYREL) 100 MG tablet Pt states he's taking 1 1/2 each night which has caused him to be short on his medication, pt states he does have a refill but its not until 21st because it was 90 day supply and he's totally out.  The pharmacy told him he needed a revised prescription for the medication.   Please advise & send new script if appropriate

## 2023-03-22 ENCOUNTER — Other Ambulatory Visit: Payer: Self-pay | Admitting: Family Medicine

## 2023-03-22 DIAGNOSIS — F5101 Primary insomnia: Secondary | ICD-10-CM

## 2023-03-22 MED ORDER — TRAZODONE HCL 100 MG PO TABS: 150.0000 mg | ORAL_TABLET | Freq: Every evening | ORAL | 3 refills | Status: DC | PRN

## 2023-03-22 NOTE — Telephone Encounter (Signed)
Patient aware and verbalized understanding. °

## 2023-03-22 NOTE — Telephone Encounter (Signed)
Ok, will send to pharmacy

## 2023-03-22 NOTE — Telephone Encounter (Signed)
Called patient he states you told him he might need to go up on it a while back so you moved it to 2 and then that was too much so you moved him back to 1 a night and then it was not enough so you told him to take one and 1/2.

## 2023-04-15 ENCOUNTER — Encounter: Payer: Self-pay | Admitting: Family Medicine

## 2023-04-15 ENCOUNTER — Ambulatory Visit (INDEPENDENT_AMBULATORY_CARE_PROVIDER_SITE_OTHER): Payer: PPO | Admitting: Family Medicine

## 2023-04-15 VITALS — BP 146/83 | HR 74 | Temp 96.8°F | Ht 73.0 in | Wt 191.8 lb

## 2023-04-15 DIAGNOSIS — F39 Unspecified mood [affective] disorder: Secondary | ICD-10-CM

## 2023-04-15 DIAGNOSIS — K219 Gastro-esophageal reflux disease without esophagitis: Secondary | ICD-10-CM | POA: Diagnosis not present

## 2023-04-15 DIAGNOSIS — E782 Mixed hyperlipidemia: Secondary | ICD-10-CM | POA: Diagnosis not present

## 2023-04-15 DIAGNOSIS — D508 Other iron deficiency anemias: Secondary | ICD-10-CM

## 2023-04-15 DIAGNOSIS — I10 Essential (primary) hypertension: Secondary | ICD-10-CM

## 2023-04-15 DIAGNOSIS — L309 Dermatitis, unspecified: Secondary | ICD-10-CM

## 2023-04-15 DIAGNOSIS — M5030 Other cervical disc degeneration, unspecified cervical region: Secondary | ICD-10-CM | POA: Insufficient documentation

## 2023-04-15 DIAGNOSIS — E559 Vitamin D deficiency, unspecified: Secondary | ICD-10-CM | POA: Diagnosis not present

## 2023-04-15 DIAGNOSIS — M0579 Rheumatoid arthritis with rheumatoid factor of multiple sites without organ or systems involvement: Secondary | ICD-10-CM

## 2023-04-15 LAB — LIPID PANEL

## 2023-04-15 MED ORDER — TRIAMCINOLONE ACETONIDE 0.1 % EX CREA
1.0000 | TOPICAL_CREAM | Freq: Two times a day (BID) | CUTANEOUS | 0 refills | Status: AC
Start: 2023-04-15 — End: ?

## 2023-04-15 MED ORDER — AMLODIPINE BESYLATE 5 MG PO TABS
5.0000 mg | ORAL_TABLET | Freq: Every day | ORAL | 1 refills | Status: AC
Start: 2023-04-15 — End: ?

## 2023-04-15 NOTE — Progress Notes (Signed)
Subjective:  Patient ID: Erik Shaffer, male    DOB: Apr 27, 1965, 57 y.o.   MRN: [id number]  Patient Care Team: Sonny Masters, FNP as PCP - General (Family Medicine) Ranee Gosselin, MD (Orthopedic Surgery)   Chief Complaint:  Medical Management of Chronic Issues (6 month chronic follow up )   HPI: Erik Shaffer is a 57 y.o. male presenting on 04/15/2023 for Medical Management of Chronic Issues (6 month chronic follow up )   Discussed the use of AI scribe software for clinical note transcription with the patient, who gave verbal consent to proceed.  History of Present Illness   The patient, with a history of chronic pain, presents with concerns about their current pain management regimen. They report taking 250mg  of tramadol daily, which only reduces their pain to a level of 4 at best. They express dissatisfaction with this level of pain control and are interested in exploring other options. They recall a previous experience with oxycodone, which they found more effective in reducing their pain below a level of 4, albeit temporarily.  In addition to their pain, the patient struggles with fatigue, which they believe may be improved with vitamin B12. They also mention a history of low iron saturation, but express reluctance to take iron supplements due to concerns about constipation.  The patient has a history of rheumatoid arthritis, which has been managed by their pain management doctor. They report having tried multiple treatments, including methotrexate, Harriette Ohara, Enbrel, and a medication beginning with 'R' and containing a 'Z,' none of which were effective.  The patient also mentions a lung nodule, which they prefer not to pursue further investigation or treatment for, even if it were to be found cancerous. They express a preference for knowing when the end is coming, rather than fighting a terminal illness.  The patient admits to a poor diet and hydration habits, including a high  intake of soft drinks and low intake of water. They plan to change their diet in the new year, with a focus on foods high in iron.     He is not taking anything for his blood pressure or cholesterol. States he does not follow a diet or exercise routine.      Relevant past medical, surgical, family, and social history reviewed and updated as indicated.  Allergies and medications reviewed and updated. Data reviewed: Chart in Epic.   Past Medical History:  Diagnosis Date   Arthritis    Depression    Ulcer     Past Surgical History:  Procedure Laterality Date   HEMILAMINOTOMY LUMBAR SPINE  06/24/2010   MICRODISCECTOMY LUMBAR     L5-S1   SPINE SURGERY  02/2011   Dr. Marikay Alar     Social History   Socioeconomic History   Marital status: Divorced    Spouse name: Not on file   Number of children: 0   Years of education: Not on file   Highest education level: Not on file  Occupational History   Occupation: not working  Tobacco Use   Smoking status: Every Day    Current packs/day: 1.00    Average packs/day: 1 pack/day for 30.0 years (30.0 ttl pk-yrs)    Types: Cigarettes   Smokeless tobacco: Never  Vaping Use   Vaping status: Never Used  Substance and Sexual Activity   Alcohol use: No    Comment: nothing in 20 plus years   Drug use: No   Sexual activity: Yes  Other  Topics Concern   Not on file  Social History Narrative   Caffienated drinks-yes   Seat belt use often-yes   Regular Exercise-no   Smoke alarm in the home-yes   Firearms/guns in the home-yes   History of physical abuse-no   HLE-12th grade   Lives with sister and her husband in a one story home.  Has no children.  Not married. Currently trying to get disability.            Social Drivers of Corporate investment banker Strain: Low Risk  (03/23/2021)   Overall Financial Resource Strain (CARDIA)    Difficulty of Paying Living Expenses: Not hard at all  Food Insecurity: No Food Insecurity (10/07/2022)    Hunger Vital Sign    Worried About Running Out of Food in the Last Year: Never true    Ran Out of Food in the Last Year: Never true  Transportation Needs: No Transportation Needs (10/07/2022)   PRAPARE - Administrator, Civil Service (Medical): No    Lack of Transportation (Non-Medical): No  Physical Activity: Insufficiently Active (03/23/2021)   Exercise Vital Sign    Days of Exercise per Week: 3 days    Minutes of Exercise per Session: 30 min  Stress: No Stress Concern Present (03/23/2021)   [employer] of Occupational Health - Occupational Stress Questionnaire    Feeling of Stress : Not at all  Social Connections: Unknown (08/31/2021)   Received from [facility], [facility]   Social Network    Social Network: Not on file  Intimate Partner Violence: Not At Risk (10/07/2022)   Humiliation, Afraid, Rape, and Kick questionnaire    Fear of Current or Ex-Partner: No    Emotionally Abused: No    Physically Abused: No    Sexually Abused: No    Outpatient Encounter Medications as of 04/15/2023  Medication Sig   amLODipine (NORVASC) 5 MG tablet Take 1 tablet (5 mg total) by mouth daily.   naproxen (NAPROSYN) 500 MG tablet Take 1 tablet (500 mg total) by mouth 2 (two) times daily with a meal.   ORAJEL 2X TOOTHACHE & GUM 20-0.26 % GEL Place 1 application  onto teeth See admin instructions. Apply to affected tooth three times a day as needed for pain   tiZANidine (ZANAFLEX) 2 MG tablet Take 1 tablet (2 mg total) by mouth 2 (two) times daily as needed for muscle spasms.   traMADol (ULTRAM) 50 MG tablet Take 1-2 tablets (50-100 mg total) by mouth See admin instructions. Take 100 mg by mouth in the morning, 100 mg late afternoon, and 50 mg at bedtime   traZODone (DESYREL) 100 MG tablet Take 1.5 tablets (150 mg total) by mouth at bedtime as needed for sleep.   TYLENOL 500 MG tablet Take 500 mg by mouth every 6 (six) hours as needed for mild pain or headache.   [DISCONTINUED]  triamcinolone cream (KENALOG) 0.1 % Apply 1 application. topically 2 (two) times daily. (Patient taking differently: Apply 1 application  topically 2 (two) times daily as needed (for itching).)   triamcinolone cream (KENALOG) 0.1 % Apply 1 Application topically 2 (two) times daily.   [DISCONTINUED] Iron, Ferrous Sulfate, 325 (65 Fe) MG TABS Take 325 mg by mouth daily.   [DISCONTINUED] Vitamin D, Ergocalciferol, (DRISDOL) 1.25 MG (50000 UNIT) CAPS capsule Take 1 capsule (50,000 Units total) by mouth every 7 (seven) days.   No facility-administered encounter medications on file as of 04/15/2023.    Allergies  Allergen Reactions   Butrans [Buprenorphine] Other (See Comments)    Fatigue    Eucrisa [Crisaborole] Rash and Other (See Comments)    Made the skin red and broken out    Pertinent ROS per HPI, otherwise unremarkable      Objective:  BP (!) 146/83   Pulse 74   Temp (!) 96.8 F (36 C)   Ht 6\' 1"  (1.854 m)   Wt 191 lb 12.8 oz (87 kg)   SpO2 97%   BMI 25.30 kg/m    Wt Readings from Last 3 Encounters:  04/15/23 191 lb 12.8 oz (87 kg)  10/14/22 187 lb 9.6 oz (85.1 kg)  10/07/22 184 lb 4.9 oz (83.6 kg)    Physical Exam Vitals and nursing note reviewed.  Constitutional:      General: He is not in acute distress.    Appearance: Normal appearance. He is well-developed and well-groomed. He is not ill-appearing, toxic-appearing or diaphoretic.  HENT:     Head: Normocephalic and atraumatic.     Jaw: There is normal jaw occlusion.     Right Ear: Hearing normal.     Left Ear: Hearing normal.     Nose: Nose normal.     Mouth/Throat:     Lips: Pink.     Mouth: Mucous membranes are moist.     Pharynx: Oropharynx is clear. Uvula midline.  Eyes:     General: Lids are normal.     Extraocular Movements: Extraocular movements intact.     Conjunctiva/sclera: Conjunctivae normal.     Pupils: Pupils are equal, round, and reactive to light.  Neck:     Thyroid: No thyroid mass,  thyromegaly or thyroid tenderness.     Vascular: No carotid bruit or JVD.     Trachea: Trachea and phonation normal.  Cardiovascular:     Rate and Rhythm: Normal rate and regular rhythm.     Chest Wall: PMI is not displaced.     Pulses: Normal pulses.     Heart sounds: Normal heart sounds. No murmur heard.    No friction rub. No gallop.  Pulmonary:     Effort: Pulmonary effort is normal. No respiratory distress.     Breath sounds: Normal breath sounds. No wheezing.  Abdominal:     General: Bowel sounds are normal. There is no distension or abdominal bruit.     Palpations: Abdomen is soft. There is no hepatomegaly or splenomegaly.     Tenderness: There is no abdominal tenderness. There is no right CVA tenderness or left CVA tenderness.     Hernia: No hernia is present.  Musculoskeletal:        General: Normal range of motion.     Cervical back: Normal range of motion and neck supple.     Right lower leg: No edema.     Left lower leg: No edema.  Lymphadenopathy:     Cervical: No cervical adenopathy.  Skin:    General: Skin is warm and dry.     Capillary Refill: Capillary refill takes less than 2 seconds.     Coloration: Skin is not cyanotic, jaundiced or pale.     Findings: No rash.  Neurological:     General: No focal deficit present.     Mental Status: He is alert and oriented to person, place, and time.     Sensory: Sensation is intact.     Motor: Motor function is intact.     Coordination: Coordination is intact.  Gait: Gait is intact.     Deep Tendon Reflexes: Reflexes are normal and symmetric.  Psychiatric:        Attention and Perception: Attention and perception normal.        Mood and Affect: Mood and affect normal.        Speech: Speech normal.        Behavior: Behavior normal. Behavior is cooperative.        Thought Content: Thought content normal.        Cognition and Memory: Cognition and memory normal.        Judgment: Judgment normal.     Results for  orders placed or performed in visit on 10/14/22  Anemia Profile B   Collection Time: 10/14/22  4:06 PM  Result Value Ref Range   Total Iron Binding Capacity 244 (L) 250 - 450 ug/dL   UIBC 161 096 - 045 ug/dL   Iron 15 (L) 38 - 409 ug/dL   Iron Saturation 6 (LL) 15 - 55 %   Ferritin 134 30 - 400 ng/mL   Vitamin B-12 555 232 - 1,245 pg/mL   Folate 3.0 (L) >3.0 ng/mL   WBC 8.4 3.4 - 10.8 x10E3/uL   RBC 4.28 4.14 - 5.80 x10E6/uL   Hemoglobin 12.2 (L) 13.0 - 17.7 g/dL   Hematocrit 81.1 (L) 91.4 - 51.0 %   MCV 87 79 - 97 fL   MCH 28.5 26.6 - 33.0 pg   MCHC 32.8 31.5 - 35.7 g/dL   RDW 78.2 95.6 - 21.3 %   Platelets 440 150 - 450 x10E3/uL   Neutrophils 45 Not Estab. %   Lymphs 30 Not Estab. %   Monocytes 19 Not Estab. %   Eos 4 Not Estab. %   Basos 1 Not Estab. %   Neutrophils Absolute 3.8 1.4 - 7.0 x10E3/uL   Lymphocytes Absolute 2.5 0.7 - 3.1 x10E3/uL   Monocytes Absolute 1.6 (H) 0.1 - 0.9 x10E3/uL   EOS (ABSOLUTE) 0.4 0.0 - 0.4 x10E3/uL   Basophils Absolute 0.1 0.0 - 0.2 x10E3/uL   Immature Granulocytes 1 Not Estab. %   Immature Grans (Abs) 0.0 0.0 - 0.1 x10E3/uL   Retic Ct Pct 2.3 0.6 - 2.6 %  CMP14+EGFR   Collection Time: 10/14/22  4:06 PM  Result Value Ref Range   Glucose 92 70 - 99 mg/dL   BUN 6 6 - 24 mg/dL   Creatinine, Ser 0.86 0.76 - 1.27 mg/dL   eGFR 578 >46 NG/EXB/2.84   BUN/Creatinine Ratio 8 (L) 9 - 20   Sodium 136 134 - 144 mmol/L   Potassium 4.7 3.5 - 5.2 mmol/L   Chloride 96 96 - 106 mmol/L   CO2 27 20 - 29 mmol/L   Calcium 8.8 8.7 - 10.2 mg/dL   Total Protein 6.5 6.0 - 8.5 g/dL   Albumin 3.8 3.8 - 4.9 g/dL   Globulin, Total 2.7 1.5 - 4.5 g/dL   Bilirubin Total <1.3 0.0 - 1.2 mg/dL   Alkaline Phosphatase 106 44 - 121 IU/L   AST 33 0 - 40 IU/L   ALT 31 0 - 44 IU/L  Vitamin B1   Collection Time: 10/14/22  4:06 PM  Result Value Ref Range   Thiamine 126.9 66.5 - 200.0 nmol/L  Thyroid Panel With TSH   Collection Time: 10/14/22  4:06 PM  Result Value Ref  Range   TSH 2.170 0.450 - 4.500 uIU/mL   T4, Total 8.6 4.5 - 12.0 ug/dL  T3 Uptake Ratio 27 24 - 39 %   Free Thyroxine Index 2.3 1.2 - 4.9  VITAMIN D 25 Hydroxy (Vit-D Deficiency, Fractures)   Collection Time: 10/14/22  4:06 PM  Result Value Ref Range   Vit D, 25-Hydroxy 23.9 (L) 30.0 - 100.0 ng/mL       Pertinent labs & imaging results that were available during my care of the patient were reviewed by me and considered in my medical decision making.  Assessment & Plan:  Lennin was seen today for medical management of chronic issues.  Diagnoses and all orders for this visit:  Vitamin D deficiency -     CMP14+EGFR -     VITAMIN D 25 Hydroxy (Vit-D Deficiency, Fractures)  Mixed hyperlipidemia -     CMP14+EGFR -     Lipid panel  Essential hypertension -     Anemia Profile B -     CMP14+EGFR -     Lipid panel -     Thyroid Panel With TSH -     amLODipine (NORVASC) 5 MG tablet; Take 1 tablet (5 mg total) by mouth daily.  Gastroesophageal reflux disease without esophagitis -     Anemia Profile B  Iron deficiency anemia secondary to inadequate dietary iron intake -     Anemia Profile B  Facial eczema -     triamcinolone cream (KENALOG) 0.1 %; Apply 1 Application topically 2 (two) times daily.  Rheumatoid arthritis involving multiple sites with positive rheumatoid factor (HCC) Currently not seeing rheumatology.   Mood disorder (HCC) Denies changes in mood.     Assessment and Plan    Chronic Pain Management Chronic pain managed with tramadol 250 mg/day, inadequate relief (pain level never below 4). Prefers tramadol due to its energizing effect over hydrocodone, which caused lethargy. Discussed potential switch to long-acting opioids for better pain control and reduced dosing frequency. Informed about opioid risks, including dependency and side effects, and benefits of improved pain control with fewer doses. - Discuss with pain management doctor about switching to  long-acting opioids or adjusting current regimen.  Iron Deficiency Anemia Critically low iron saturation (6%). Reluctant to take iron supplements due to fear of constipation. Plans to modify diet to include more iron-rich foods. Discussed potential iron infusion if levels remain low, including benefits of rapid correction and risks such as allergic reactions and infection. - Check iron levels today. - Consider iron infusion if levels remain critically low.  Hypertension Hypertension managed with Norvasc, which also alleviated chest pressure in the past. Informed about Norvasc benefits in managing blood pressure and chest pain, and potential side effects like dizziness and swelling. - Restart Norvasc for blood pressure management.  Rheumatoid Arthritis Long-standing rheumatoid arthritis with previous ineffective treatments (methotrexate, Xeljanz, Humira, Enbrel). Currently managed by pain management doctor. Informed about the chronic nature of rheumatoid arthritis and importance of ongoing management to prevent joint damage and maintain function. - Continue current management with pain management doctor.  General Health Maintenance Poor dietary habits, high soft drink consumption, low water intake. Discussed importance of hydration and balanced diet. Plans to include more iron-rich foods. Vitamin D supplementation status unknown. Informed about benefits of adequate hydration and balanced diet, and risks of dehydration and poor nutrition. - Check vitamin D levels today. - Encourage increased water intake and reduction of soft drinks. - Encourage dietary changes to include more iron-rich foods.  Goals of Care Prefers not to pursue aggressive treatment if diagnosed with a terminal illness, citing family history  of suffering from cancer treatment. Focus on quality of life rather than extending life at all costs. - Document preference for palliative care over aggressive treatment in the event of a  terminal diagnosis.  Follow-up - Follow up with pain management doctor in January.        Total time spent with patient 40 minutes.  Greater than 50% of encounter spent in coordination of care/counseling.   Continue all other maintenance medications.  Follow up plan: Return in about 6 months (around 10/14/2023) for Annual Physical.   Continue healthy lifestyle choices, including diet (rich in fruits, vegetables, and lean proteins, and low in salt and simple carbohydrates) and exercise (at least 30 minutes of moderate physical activity daily).  Educational handout given for IDA, health maintenance   The above assessment and management plan was discussed with the patient. The patient verbalized understanding of and has agreed to the management plan. Patient is aware to call the clinic if they develop any new symptoms or if symptoms persist or worsen. Patient is aware when to return to the clinic for a follow-up visit. Patient educated on when it is appropriate to go to the emergency department.   Kari Baars, FNP-C [facility] [phone]

## 2023-04-16 LAB — CMP14+EGFR
ALT: 13 IU/L (ref 0–44)
AST: 17 IU/L (ref 0–40)
Albumin: 4.3 g/dL (ref 3.8–4.9)
Alkaline Phosphatase: 99 IU/L (ref 44–121)
BUN/Creatinine Ratio: 12 (ref 9–20)
BUN: 10 mg/dL (ref 6–24)
CO2: 24 mmol/L (ref 20–29)
Calcium: 9.2 mg/dL (ref 8.7–10.2)
Chloride: 101 mmol/L (ref 96–106)
Creatinine, Ser: 0.81 mg/dL (ref 0.76–1.27)
Globulin, Total: 2.5 g/dL (ref 1.5–4.5)
Glucose: 79 mg/dL (ref 70–99)
Potassium: 4.3 mmol/L (ref 3.5–5.2)
Sodium: 137 mmol/L (ref 134–144)
Total Protein: 6.8 g/dL (ref 6.0–8.5)
eGFR: 103 mL/min/{1.73_m2} (ref >59)

## 2023-04-16 LAB — ANEMIA PROFILE B
Basophils Absolute: 0.1 10*3/uL (ref 0.0–0.2)
Basos: 1 %
EOS (ABSOLUTE): 0.2 10*3/uL (ref 0.0–0.4)
Eos: 2 %
Ferritin: 61 ng/mL (ref 30–400)
Folate: 3.2 ng/mL (ref >3.0)
Hematocrit: 44.2 % (ref 37.5–51.0)
Hemoglobin: 14.6 g/dL (ref 13.0–17.7)
Immature Grans (Abs): 0 10*3/uL (ref 0.0–0.1)
Immature Granulocytes: 0 %
Iron Saturation: 13 % — ABNORMAL LOW (ref 15–55)
Iron: 37 ug/dL — ABNORMAL LOW (ref 38–169)
Lymphocytes Absolute: 3.9 10*3/uL — ABNORMAL HIGH (ref 0.7–3.1)
Lymphs: 31 %
MCH: 29.2 pg (ref 26.6–33.0)
MCHC: 33 g/dL (ref 31.5–35.7)
MCV: 88 fL (ref 79–97)
Monocytes Absolute: 1.3 10*3/uL — ABNORMAL HIGH (ref 0.1–0.9)
Monocytes: 10 %
Neutrophils Absolute: 7.2 10*3/uL — ABNORMAL HIGH (ref 1.4–7.0)
Neutrophils: 56 %
Platelets: 443 10*3/uL (ref 150–450)
RBC: 5 x10E6/uL (ref 4.14–5.80)
RDW: 13.9 % (ref 11.6–15.4)
Retic Ct Pct: 1.5 % (ref 0.6–2.6)
Total Iron Binding Capacity: 292 ug/dL (ref 250–450)
UIBC: 255 ug/dL (ref 111–343)
Vitamin B-12: 332 pg/mL (ref 232–1245)
WBC: 12.6 10*3/uL — ABNORMAL HIGH (ref 3.4–10.8)

## 2023-04-16 LAB — THYROID PANEL WITH TSH
Free Thyroxine Index: 2.2 (ref 1.2–4.9)
T3 Uptake Ratio: 27 % (ref 24–39)
T4, Total: 8.3 ug/dL (ref 4.5–12.0)
TSH: 1.52 u[IU]/mL (ref 0.450–4.500)

## 2023-04-16 LAB — VITAMIN D 25 HYDROXY (VIT D DEFICIENCY, FRACTURES): Vit D, 25-Hydroxy: 12.1 ng/mL — ABNORMAL LOW (ref 30.0–100.0)

## 2023-04-16 LAB — LIPID PANEL
Cholesterol, Total: 231 mg/dL — ABNORMAL HIGH (ref 100–199)
HDL: 35 mg/dL — ABNORMAL LOW (ref >39)
LDL CALC COMMENT:: 6.6 ratio — ABNORMAL HIGH (ref 0.0–5.0)
LDL Chol Calc (NIH): 145 mg/dL — ABNORMAL HIGH (ref 0–99)
Triglycerides: 276 mg/dL — ABNORMAL HIGH (ref 0–149)
VLDL Cholesterol Cal: 51 mg/dL — ABNORMAL HIGH (ref 5–40)

## 2023-04-21 ENCOUNTER — Telehealth: Payer: Self-pay | Admitting: Family Medicine

## 2023-04-21 MED ORDER — VITAMIN D (ERGOCALCIFEROL) 1.25 MG (50000 UNIT) PO CAPS
50000.0000 [IU] | ORAL_CAPSULE | ORAL | 3 refills | Status: AC
Start: 2023-04-21 — End: ?

## 2023-04-21 NOTE — Telephone Encounter (Signed)
 Copied from CRM [id number]. Topic: Clinical - Medication Question >> Apr 19, 2023  1:13 PM Powell B wrote: Reason for CRM: Patient calling because was seen on 12/27 by PCP for B12 deficiency, was advised to start supplement but needs to know how much/miligram amount.

## 2023-04-21 NOTE — Telephone Encounter (Signed)
 Patient aware and verbalizes understanding.

## 2023-04-21 NOTE — Telephone Encounter (Signed)
 Lmtcb

## 2023-05-12 DIAGNOSIS — M069 Rheumatoid arthritis, unspecified: Secondary | ICD-10-CM | POA: Diagnosis not present

## 2023-05-12 DIAGNOSIS — G894 Chronic pain syndrome: Secondary | ICD-10-CM | POA: Diagnosis not present

## 2023-05-12 DIAGNOSIS — M545 Low back pain, unspecified: Secondary | ICD-10-CM | POA: Diagnosis not present

## 2023-05-12 DIAGNOSIS — Z79899 Other long term (current) drug therapy: Secondary | ICD-10-CM | POA: Diagnosis not present

## 2023-05-12 DIAGNOSIS — Z91148 Patient's other noncompliance with medication regimen for other reason: Secondary | ICD-10-CM | POA: Diagnosis not present

## 2023-05-12 DIAGNOSIS — M62838 Other muscle spasm: Secondary | ICD-10-CM | POA: Diagnosis not present

## 2023-05-16 DIAGNOSIS — Z79899 Other long term (current) drug therapy: Secondary | ICD-10-CM | POA: Diagnosis not present

## 2023-07-11 DIAGNOSIS — M62838 Other muscle spasm: Secondary | ICD-10-CM | POA: Diagnosis not present

## 2023-07-11 DIAGNOSIS — M545 Low back pain, unspecified: Secondary | ICD-10-CM | POA: Diagnosis not present

## 2023-07-11 DIAGNOSIS — Z79899 Other long term (current) drug therapy: Secondary | ICD-10-CM | POA: Diagnosis not present

## 2023-07-11 DIAGNOSIS — M069 Rheumatoid arthritis, unspecified: Secondary | ICD-10-CM | POA: Diagnosis not present

## 2023-07-11 DIAGNOSIS — G894 Chronic pain syndrome: Secondary | ICD-10-CM | POA: Diagnosis not present

## 2023-07-12 ENCOUNTER — Telehealth: Payer: Self-pay

## 2023-07-12 ENCOUNTER — Telehealth: Payer: Self-pay | Admitting: Family Medicine

## 2023-07-12 DIAGNOSIS — Z0279 Encounter for issue of other medical certificate: Secondary | ICD-10-CM

## 2023-07-12 NOTE — Telephone Encounter (Signed)
 Typed up forms and placed on PCP's desk

## 2023-07-12 NOTE — Telephone Encounter (Signed)
 Copied from CRM [id number]. Topic: General - Other >> Jul 12, 2023 11:25 AM [name] wrote: Reason for CRM: Pt is wanting paperwork to get a handicapp tag. He is not wanting the tag for the mirror but rather the actual tag that says handicap. Please call him and advise.

## 2023-07-12 NOTE — Telephone Encounter (Signed)
 Duplicate encounter. LS

## 2023-07-12 NOTE — Telephone Encounter (Signed)
 Aware handicap forms ready

## 2023-07-14 DIAGNOSIS — Z79899 Other long term (current) drug therapy: Secondary | ICD-10-CM | POA: Diagnosis not present

## 2023-09-08 DIAGNOSIS — G894 Chronic pain syndrome: Secondary | ICD-10-CM | POA: Diagnosis not present

## 2023-09-08 DIAGNOSIS — M069 Rheumatoid arthritis, unspecified: Secondary | ICD-10-CM | POA: Diagnosis not present

## 2023-09-08 DIAGNOSIS — M545 Low back pain, unspecified: Secondary | ICD-10-CM | POA: Diagnosis not present

## 2023-09-08 DIAGNOSIS — Z79899 Other long term (current) drug therapy: Secondary | ICD-10-CM | POA: Diagnosis not present

## 2023-09-12 DIAGNOSIS — Z79899 Other long term (current) drug therapy: Secondary | ICD-10-CM | POA: Diagnosis not present

## 2023-10-19 ENCOUNTER — Encounter: Payer: PPO | Admitting: Family Medicine

## 2023-12-05 DIAGNOSIS — M069 Rheumatoid arthritis, unspecified: Secondary | ICD-10-CM | POA: Diagnosis not present

## 2023-12-05 DIAGNOSIS — G894 Chronic pain syndrome: Secondary | ICD-10-CM | POA: Diagnosis not present

## 2023-12-05 DIAGNOSIS — M62838 Other muscle spasm: Secondary | ICD-10-CM | POA: Diagnosis not present

## 2023-12-05 DIAGNOSIS — Z79899 Other long term (current) drug therapy: Secondary | ICD-10-CM | POA: Diagnosis not present

## 2024-01-01 ENCOUNTER — Other Ambulatory Visit: Payer: Self-pay | Admitting: Family Medicine

## 2024-01-01 DIAGNOSIS — M0579 Rheumatoid arthritis with rheumatoid factor of multiple sites without organ or systems involvement: Secondary | ICD-10-CM

## 2024-02-23 ENCOUNTER — Encounter: Payer: Self-pay | Admitting: Family Medicine

## 2024-03-01 DIAGNOSIS — M545 Low back pain, unspecified: Secondary | ICD-10-CM | POA: Diagnosis not present

## 2024-03-01 DIAGNOSIS — Z79899 Other long term (current) drug therapy: Secondary | ICD-10-CM | POA: Diagnosis not present

## 2024-03-01 DIAGNOSIS — M069 Rheumatoid arthritis, unspecified: Secondary | ICD-10-CM | POA: Diagnosis not present

## 2024-03-01 DIAGNOSIS — M62838 Other muscle spasm: Secondary | ICD-10-CM | POA: Diagnosis not present

## 2024-03-31 ENCOUNTER — Other Ambulatory Visit: Payer: Self-pay | Admitting: Family Medicine

## 2024-03-31 DIAGNOSIS — M0579 Rheumatoid arthritis with rheumatoid factor of multiple sites without organ or systems involvement: Secondary | ICD-10-CM

## 2024-05-11 ENCOUNTER — Other Ambulatory Visit: Payer: Self-pay | Admitting: Family Medicine

## 2024-05-11 DIAGNOSIS — F5101 Primary insomnia: Secondary | ICD-10-CM

## 2024-05-15 ENCOUNTER — Ambulatory Visit: Admitting: Family Medicine

## 2024-05-23 ENCOUNTER — Ambulatory Visit: Admitting: Family Medicine

## 2024-06-07 ENCOUNTER — Encounter: Admitting: Family Medicine
